# Patient Record
Sex: Male | Born: 1945
Health system: Southern US, Community
[De-identification: ages and names within clinical notes are randomized; demographics above are authoritative.]

## PROBLEM LIST (undated history)

## (undated) DIAGNOSIS — M5412 Radiculopathy, cervical region: Secondary | ICD-10-CM

## (undated) DIAGNOSIS — Q605 Renal hypoplasia, unspecified: Secondary | ICD-10-CM

## (undated) DIAGNOSIS — R1310 Dysphagia, unspecified: Secondary | ICD-10-CM

## (undated) DIAGNOSIS — G4700 Insomnia, unspecified: Secondary | ICD-10-CM

## (undated) DIAGNOSIS — Z8614 Personal history of Methicillin resistant Staphylococcus aureus infection: Secondary | ICD-10-CM

## (undated) DIAGNOSIS — Z905 Acquired absence of kidney: Secondary | ICD-10-CM

## (undated) DIAGNOSIS — R7303 Prediabetes: Secondary | ICD-10-CM

## (undated) DIAGNOSIS — E782 Mixed hyperlipidemia: Secondary | ICD-10-CM

## (undated) DIAGNOSIS — Q602 Renal agenesis, unspecified: Secondary | ICD-10-CM

## (undated) DIAGNOSIS — R002 Palpitations: Secondary | ICD-10-CM

## (undated) DIAGNOSIS — I739 Peripheral vascular disease, unspecified: Secondary | ICD-10-CM

## (undated) DIAGNOSIS — C61 Malignant neoplasm of prostate: Secondary | ICD-10-CM

## (undated) DIAGNOSIS — I10 Essential (primary) hypertension: Secondary | ICD-10-CM

## (undated) DIAGNOSIS — R200 Anesthesia of skin: Secondary | ICD-10-CM

## (undated) DIAGNOSIS — E559 Vitamin D deficiency, unspecified: Secondary | ICD-10-CM

## (undated) DIAGNOSIS — K219 Gastro-esophageal reflux disease without esophagitis: Secondary | ICD-10-CM

## (undated) DIAGNOSIS — M545 Low back pain: Secondary | ICD-10-CM

## (undated) DIAGNOSIS — E291 Testicular hypofunction: Secondary | ICD-10-CM

## (undated) HISTORY — DX: Insomnia, unspecified: G47.00

## (undated) HISTORY — DX: Dysphagia, unspecified: R13.10

## (undated) HISTORY — DX: Acquired absence of kidney: Z90.5

## (undated) HISTORY — DX: Gastro-esophageal reflux disease without esophagitis: K21.9

## (undated) HISTORY — DX: Prediabetes: R73.03

## (undated) HISTORY — DX: Renal hypoplasia, unspecified: Q60.5

## (undated) HISTORY — DX: Testicular hypofunction: E29.1

## (undated) HISTORY — DX: Peripheral vascular disease, unspecified: I73.9

## (undated) HISTORY — DX: Palpitations: R00.2

## (undated) HISTORY — DX: Low back pain: M54.5

## (undated) HISTORY — DX: Anesthesia of skin: R20.0

## (undated) HISTORY — DX: Radiculopathy, cervical region: M54.12

## (undated) HISTORY — DX: Mixed hyperlipidemia: E78.2

## (undated) HISTORY — DX: Vitamin D deficiency, unspecified: E55.9

## (undated) HISTORY — DX: Renal agenesis, unspecified: Q60.2

## (undated) HISTORY — DX: Personal history of Methicillin resistant Staphylococcus aureus infection: Z86.14

---

## 1988-12-29 DIAGNOSIS — Z905 Acquired absence of kidney: Secondary | ICD-10-CM

## 1988-12-29 HISTORY — DX: Acquired absence of kidney: Z90.5

## 1989-12-29 HISTORY — PX: SPINE SURGERY: SHX786

## 1998-12-29 DIAGNOSIS — E782 Mixed hyperlipidemia: Secondary | ICD-10-CM

## 1998-12-29 HISTORY — DX: Mixed hyperlipidemia: E78.2

## 1999-12-30 HISTORY — PX: CHOLECYSTECTOMY: SHX55

## 1999-12-30 HISTORY — PX: NEPHRECTOMY: SHX65

## 2006-08-28 ENCOUNTER — Emergency Department: Payer: Self-pay | Admitting: Unknown Physician Specialty

## 2006-08-28 ENCOUNTER — Other Ambulatory Visit: Payer: Self-pay

## 2006-09-01 ENCOUNTER — Ambulatory Visit: Payer: Self-pay | Admitting: Unknown Physician Specialty

## 2006-12-29 HISTORY — PX: PENILE PROSTHESIS IMPLANT: SHX240

## 2007-03-16 ENCOUNTER — Ambulatory Visit: Payer: Self-pay | Admitting: Urology

## 2007-03-22 ENCOUNTER — Ambulatory Visit: Payer: Self-pay | Admitting: Urology

## 2007-11-18 DIAGNOSIS — Z8614 Personal history of Methicillin resistant Staphylococcus aureus infection: Secondary | ICD-10-CM

## 2007-11-18 HISTORY — DX: Personal history of Methicillin resistant Staphylococcus aureus infection: Z86.14

## 2008-12-14 ENCOUNTER — Ambulatory Visit: Payer: Self-pay | Admitting: Family Medicine

## 2008-12-20 ENCOUNTER — Ambulatory Visit: Payer: Self-pay | Admitting: Gastroenterology

## 2009-01-05 ENCOUNTER — Ambulatory Visit: Payer: Self-pay | Admitting: Family Medicine

## 2009-04-02 DIAGNOSIS — R1310 Dysphagia, unspecified: Secondary | ICD-10-CM

## 2009-04-02 DIAGNOSIS — M5412 Radiculopathy, cervical region: Secondary | ICD-10-CM

## 2009-04-02 DIAGNOSIS — I10 Essential (primary) hypertension: Secondary | ICD-10-CM

## 2009-04-02 HISTORY — DX: Essential (primary) hypertension: I10

## 2009-04-02 HISTORY — DX: Dysphagia, unspecified: R13.10

## 2009-04-02 HISTORY — DX: Radiculopathy, cervical region: M54.12

## 2011-06-09 ENCOUNTER — Ambulatory Visit: Payer: Self-pay | Admitting: Family Medicine

## 2011-07-09 HISTORY — PX: OTHER SURGICAL HISTORY: SHX169

## 2011-07-10 HISTORY — PX: UPPER GI ENDOSCOPY: SHX6162

## 2011-11-28 ENCOUNTER — Ambulatory Visit: Payer: Self-pay | Admitting: Family Medicine

## 2011-11-28 HISTORY — PX: OTHER SURGICAL HISTORY: SHX169

## 2012-01-19 ENCOUNTER — Ambulatory Visit: Payer: Self-pay | Admitting: Urology

## 2012-01-19 HISTORY — PX: OTHER SURGICAL HISTORY: SHX169

## 2012-04-15 DIAGNOSIS — I10 Essential (primary) hypertension: Secondary | ICD-10-CM | POA: Diagnosis not present

## 2012-04-15 DIAGNOSIS — E119 Type 2 diabetes mellitus without complications: Secondary | ICD-10-CM | POA: Diagnosis not present

## 2012-04-15 DIAGNOSIS — M79609 Pain in unspecified limb: Secondary | ICD-10-CM | POA: Diagnosis not present

## 2012-04-15 DIAGNOSIS — E559 Vitamin D deficiency, unspecified: Secondary | ICD-10-CM | POA: Diagnosis not present

## 2012-04-21 DIAGNOSIS — R972 Elevated prostate specific antigen [PSA]: Secondary | ICD-10-CM | POA: Diagnosis not present

## 2012-05-26 DIAGNOSIS — D400 Neoplasm of uncertain behavior of prostate: Secondary | ICD-10-CM | POA: Diagnosis not present

## 2012-05-26 DIAGNOSIS — N529 Male erectile dysfunction, unspecified: Secondary | ICD-10-CM | POA: Diagnosis not present

## 2012-07-15 DIAGNOSIS — I10 Essential (primary) hypertension: Secondary | ICD-10-CM | POA: Diagnosis not present

## 2012-07-15 DIAGNOSIS — E119 Type 2 diabetes mellitus without complications: Secondary | ICD-10-CM | POA: Diagnosis not present

## 2012-07-15 DIAGNOSIS — E559 Vitamin D deficiency, unspecified: Secondary | ICD-10-CM | POA: Diagnosis not present

## 2012-07-15 DIAGNOSIS — M79609 Pain in unspecified limb: Secondary | ICD-10-CM | POA: Diagnosis not present

## 2012-10-15 DIAGNOSIS — M79609 Pain in unspecified limb: Secondary | ICD-10-CM | POA: Diagnosis not present

## 2012-10-15 DIAGNOSIS — M25559 Pain in unspecified hip: Secondary | ICD-10-CM | POA: Diagnosis not present

## 2012-10-15 DIAGNOSIS — R209 Unspecified disturbances of skin sensation: Secondary | ICD-10-CM | POA: Diagnosis not present

## 2012-10-15 DIAGNOSIS — R51 Headache: Secondary | ICD-10-CM | POA: Diagnosis not present

## 2012-10-18 ENCOUNTER — Ambulatory Visit: Payer: Self-pay | Admitting: Family Medicine

## 2012-10-18 DIAGNOSIS — I6529 Occlusion and stenosis of unspecified carotid artery: Secondary | ICD-10-CM | POA: Diagnosis not present

## 2012-10-18 DIAGNOSIS — R209 Unspecified disturbances of skin sensation: Secondary | ICD-10-CM | POA: Diagnosis not present

## 2012-10-18 HISTORY — PX: OTHER SURGICAL HISTORY: SHX169

## 2012-10-20 DIAGNOSIS — N529 Male erectile dysfunction, unspecified: Secondary | ICD-10-CM | POA: Diagnosis not present

## 2012-10-20 DIAGNOSIS — D400 Neoplasm of uncertain behavior of prostate: Secondary | ICD-10-CM | POA: Diagnosis not present

## 2012-10-29 DIAGNOSIS — M25559 Pain in unspecified hip: Secondary | ICD-10-CM | POA: Diagnosis not present

## 2012-10-29 DIAGNOSIS — M79609 Pain in unspecified limb: Secondary | ICD-10-CM | POA: Diagnosis not present

## 2012-10-29 DIAGNOSIS — I10 Essential (primary) hypertension: Secondary | ICD-10-CM | POA: Diagnosis not present

## 2012-10-29 DIAGNOSIS — I70219 Atherosclerosis of native arteries of extremities with intermittent claudication, unspecified extremity: Secondary | ICD-10-CM | POA: Diagnosis not present

## 2012-11-05 DIAGNOSIS — R972 Elevated prostate specific antigen [PSA]: Secondary | ICD-10-CM | POA: Diagnosis not present

## 2012-11-05 DIAGNOSIS — C61 Malignant neoplasm of prostate: Secondary | ICD-10-CM | POA: Diagnosis not present

## 2012-11-11 DIAGNOSIS — R972 Elevated prostate specific antigen [PSA]: Secondary | ICD-10-CM | POA: Diagnosis not present

## 2012-11-12 DIAGNOSIS — L723 Sebaceous cyst: Secondary | ICD-10-CM | POA: Diagnosis not present

## 2012-11-12 DIAGNOSIS — L821 Other seborrheic keratosis: Secondary | ICD-10-CM | POA: Diagnosis not present

## 2012-11-12 DIAGNOSIS — L820 Inflamed seborrheic keratosis: Secondary | ICD-10-CM | POA: Diagnosis not present

## 2012-11-22 DIAGNOSIS — L723 Sebaceous cyst: Secondary | ICD-10-CM | POA: Diagnosis not present

## 2012-12-17 DIAGNOSIS — C61 Malignant neoplasm of prostate: Secondary | ICD-10-CM | POA: Diagnosis not present

## 2012-12-29 DIAGNOSIS — C61 Malignant neoplasm of prostate: Secondary | ICD-10-CM

## 2012-12-29 HISTORY — DX: Malignant neoplasm of prostate: C61

## 2013-02-02 DIAGNOSIS — D400 Neoplasm of uncertain behavior of prostate: Secondary | ICD-10-CM | POA: Diagnosis not present

## 2013-02-02 DIAGNOSIS — C61 Malignant neoplasm of prostate: Secondary | ICD-10-CM | POA: Diagnosis not present

## 2013-02-02 DIAGNOSIS — N529 Male erectile dysfunction, unspecified: Secondary | ICD-10-CM | POA: Diagnosis not present

## 2013-03-07 DIAGNOSIS — I10 Essential (primary) hypertension: Secondary | ICD-10-CM | POA: Diagnosis not present

## 2013-03-07 DIAGNOSIS — M25569 Pain in unspecified knee: Secondary | ICD-10-CM | POA: Diagnosis not present

## 2013-03-07 DIAGNOSIS — E559 Vitamin D deficiency, unspecified: Secondary | ICD-10-CM | POA: Diagnosis not present

## 2013-03-07 DIAGNOSIS — E119 Type 2 diabetes mellitus without complications: Secondary | ICD-10-CM | POA: Diagnosis not present

## 2013-03-09 DIAGNOSIS — M5137 Other intervertebral disc degeneration, lumbosacral region: Secondary | ICD-10-CM | POA: Diagnosis not present

## 2013-03-10 ENCOUNTER — Ambulatory Visit: Payer: Self-pay | Admitting: Orthopedic Surgery

## 2013-03-10 DIAGNOSIS — M47817 Spondylosis without myelopathy or radiculopathy, lumbosacral region: Secondary | ICD-10-CM | POA: Diagnosis not present

## 2013-03-10 DIAGNOSIS — M5137 Other intervertebral disc degeneration, lumbosacral region: Secondary | ICD-10-CM | POA: Diagnosis not present

## 2013-03-24 DIAGNOSIS — M48061 Spinal stenosis, lumbar region without neurogenic claudication: Secondary | ICD-10-CM | POA: Diagnosis not present

## 2013-03-24 DIAGNOSIS — M961 Postlaminectomy syndrome, not elsewhere classified: Secondary | ICD-10-CM | POA: Diagnosis not present

## 2013-03-31 ENCOUNTER — Ambulatory Visit: Payer: Self-pay | Admitting: Orthopedic Surgery

## 2013-03-31 DIAGNOSIS — M47817 Spondylosis without myelopathy or radiculopathy, lumbosacral region: Secondary | ICD-10-CM | POA: Diagnosis not present

## 2013-04-20 DIAGNOSIS — M545 Low back pain: Secondary | ICD-10-CM | POA: Diagnosis not present

## 2013-04-21 DIAGNOSIS — M48061 Spinal stenosis, lumbar region without neurogenic claudication: Secondary | ICD-10-CM | POA: Diagnosis not present

## 2013-04-29 DIAGNOSIS — M961 Postlaminectomy syndrome, not elsewhere classified: Secondary | ICD-10-CM | POA: Diagnosis not present

## 2013-04-29 DIAGNOSIS — M48061 Spinal stenosis, lumbar region without neurogenic claudication: Secondary | ICD-10-CM | POA: Diagnosis not present

## 2013-05-10 DIAGNOSIS — C61 Malignant neoplasm of prostate: Secondary | ICD-10-CM | POA: Diagnosis not present

## 2013-05-10 DIAGNOSIS — D400 Neoplasm of uncertain behavior of prostate: Secondary | ICD-10-CM | POA: Diagnosis not present

## 2013-05-10 DIAGNOSIS — N529 Male erectile dysfunction, unspecified: Secondary | ICD-10-CM | POA: Diagnosis not present

## 2013-06-03 DIAGNOSIS — C61 Malignant neoplasm of prostate: Secondary | ICD-10-CM | POA: Diagnosis not present

## 2013-06-06 DIAGNOSIS — N529 Male erectile dysfunction, unspecified: Secondary | ICD-10-CM | POA: Diagnosis not present

## 2013-06-09 DIAGNOSIS — M961 Postlaminectomy syndrome, not elsewhere classified: Secondary | ICD-10-CM | POA: Diagnosis not present

## 2013-06-20 DIAGNOSIS — N401 Enlarged prostate with lower urinary tract symptoms: Secondary | ICD-10-CM | POA: Diagnosis not present

## 2013-07-07 DIAGNOSIS — C61 Malignant neoplasm of prostate: Secondary | ICD-10-CM | POA: Diagnosis not present

## 2013-07-07 DIAGNOSIS — N400 Enlarged prostate without lower urinary tract symptoms: Secondary | ICD-10-CM | POA: Diagnosis not present

## 2013-07-13 DIAGNOSIS — M545 Low back pain, unspecified: Secondary | ICD-10-CM | POA: Insufficient documentation

## 2013-07-13 HISTORY — DX: Low back pain, unspecified: M54.50

## 2013-07-15 DIAGNOSIS — C61 Malignant neoplasm of prostate: Secondary | ICD-10-CM | POA: Diagnosis not present

## 2013-07-19 DIAGNOSIS — IMO0002 Reserved for concepts with insufficient information to code with codable children: Secondary | ICD-10-CM | POA: Diagnosis not present

## 2013-07-22 DIAGNOSIS — IMO0002 Reserved for concepts with insufficient information to code with codable children: Secondary | ICD-10-CM | POA: Diagnosis not present

## 2013-07-25 DIAGNOSIS — IMO0002 Reserved for concepts with insufficient information to code with codable children: Secondary | ICD-10-CM | POA: Diagnosis not present

## 2013-07-27 ENCOUNTER — Emergency Department: Payer: Self-pay | Admitting: Emergency Medicine

## 2013-07-27 DIAGNOSIS — E119 Type 2 diabetes mellitus without complications: Secondary | ICD-10-CM | POA: Diagnosis not present

## 2013-07-27 DIAGNOSIS — I10 Essential (primary) hypertension: Secondary | ICD-10-CM | POA: Diagnosis not present

## 2013-07-27 DIAGNOSIS — R55 Syncope and collapse: Secondary | ICD-10-CM | POA: Diagnosis not present

## 2013-07-27 DIAGNOSIS — Z87891 Personal history of nicotine dependence: Secondary | ICD-10-CM | POA: Diagnosis not present

## 2013-07-27 DIAGNOSIS — R6889 Other general symptoms and signs: Secondary | ICD-10-CM | POA: Diagnosis not present

## 2013-07-27 DIAGNOSIS — Z9889 Other specified postprocedural states: Secondary | ICD-10-CM | POA: Diagnosis not present

## 2013-07-27 DIAGNOSIS — C61 Malignant neoplasm of prostate: Secondary | ICD-10-CM | POA: Diagnosis not present

## 2013-07-27 DIAGNOSIS — Z8546 Personal history of malignant neoplasm of prostate: Secondary | ICD-10-CM | POA: Diagnosis not present

## 2013-07-27 LAB — URINALYSIS, COMPLETE
Ph: 7 (ref 4.5–8.0)
Protein: 100
RBC,UR: 3630 /HPF (ref 0–5)
Squamous Epithelial: 4
WBC UR: 180 /HPF (ref 0–5)

## 2013-07-27 LAB — COMPREHENSIVE METABOLIC PANEL
Alkaline Phosphatase: 80 U/L (ref 50–136)
Anion Gap: 7 (ref 7–16)
BUN: 15 mg/dL (ref 7–18)
Calcium, Total: 9.3 mg/dL (ref 8.5–10.1)
Chloride: 104 mmol/L (ref 98–107)
EGFR (African American): 38 — ABNORMAL LOW
Glucose: 156 mg/dL — ABNORMAL HIGH (ref 65–99)
Potassium: 3.5 mmol/L (ref 3.5–5.1)
SGOT(AST): 15 U/L (ref 15–37)
SGPT (ALT): 18 U/L (ref 12–78)

## 2013-07-27 LAB — CBC
MCH: 21.7 pg — ABNORMAL LOW (ref 26.0–34.0)
MCV: 67 fL — ABNORMAL LOW (ref 80–100)
Platelet: 293 10*3/uL (ref 150–440)
RBC: 5.95 10*6/uL — ABNORMAL HIGH (ref 4.40–5.90)
RDW: 16.3 % — ABNORMAL HIGH (ref 11.5–14.5)
WBC: 12.3 10*3/uL — ABNORMAL HIGH (ref 3.8–10.6)

## 2013-07-27 LAB — CK TOTAL AND CKMB (NOT AT ARMC)
CK, Total: 123 U/L (ref 35–232)
CK-MB: 1.1 ng/mL (ref 0.5–3.6)

## 2013-08-03 DIAGNOSIS — C61 Malignant neoplasm of prostate: Secondary | ICD-10-CM | POA: Diagnosis not present

## 2013-08-09 DIAGNOSIS — R350 Frequency of micturition: Secondary | ICD-10-CM | POA: Diagnosis not present

## 2013-08-09 DIAGNOSIS — R351 Nocturia: Secondary | ICD-10-CM | POA: Diagnosis not present

## 2013-08-11 DIAGNOSIS — R319 Hematuria, unspecified: Secondary | ICD-10-CM | POA: Diagnosis not present

## 2013-08-17 DIAGNOSIS — R300 Dysuria: Secondary | ICD-10-CM | POA: Diagnosis not present

## 2013-08-17 DIAGNOSIS — N318 Other neuromuscular dysfunction of bladder: Secondary | ICD-10-CM | POA: Diagnosis not present

## 2013-08-17 DIAGNOSIS — C61 Malignant neoplasm of prostate: Secondary | ICD-10-CM | POA: Diagnosis not present

## 2013-08-17 DIAGNOSIS — D400 Neoplasm of uncertain behavior of prostate: Secondary | ICD-10-CM | POA: Diagnosis not present

## 2013-08-31 DIAGNOSIS — R300 Dysuria: Secondary | ICD-10-CM | POA: Diagnosis not present

## 2013-08-31 DIAGNOSIS — C61 Malignant neoplasm of prostate: Secondary | ICD-10-CM | POA: Diagnosis not present

## 2013-08-31 DIAGNOSIS — R350 Frequency of micturition: Secondary | ICD-10-CM | POA: Diagnosis not present

## 2013-08-31 DIAGNOSIS — D400 Neoplasm of uncertain behavior of prostate: Secondary | ICD-10-CM | POA: Diagnosis not present

## 2013-09-13 DIAGNOSIS — D400 Neoplasm of uncertain behavior of prostate: Secondary | ICD-10-CM | POA: Diagnosis not present

## 2013-09-13 DIAGNOSIS — I10 Essential (primary) hypertension: Secondary | ICD-10-CM | POA: Diagnosis not present

## 2013-09-13 DIAGNOSIS — E785 Hyperlipidemia, unspecified: Secondary | ICD-10-CM | POA: Diagnosis not present

## 2013-09-13 DIAGNOSIS — C61 Malignant neoplasm of prostate: Secondary | ICD-10-CM | POA: Diagnosis not present

## 2013-09-13 DIAGNOSIS — N318 Other neuromuscular dysfunction of bladder: Secondary | ICD-10-CM | POA: Diagnosis not present

## 2013-09-13 DIAGNOSIS — E119 Type 2 diabetes mellitus without complications: Secondary | ICD-10-CM | POA: Diagnosis not present

## 2013-09-14 DIAGNOSIS — E782 Mixed hyperlipidemia: Secondary | ICD-10-CM | POA: Diagnosis not present

## 2013-09-14 DIAGNOSIS — I10 Essential (primary) hypertension: Secondary | ICD-10-CM | POA: Diagnosis not present

## 2013-09-14 DIAGNOSIS — E559 Vitamin D deficiency, unspecified: Secondary | ICD-10-CM | POA: Diagnosis not present

## 2013-10-11 DIAGNOSIS — D400 Neoplasm of uncertain behavior of prostate: Secondary | ICD-10-CM | POA: Diagnosis not present

## 2013-10-11 DIAGNOSIS — R351 Nocturia: Secondary | ICD-10-CM | POA: Diagnosis not present

## 2013-10-11 DIAGNOSIS — C61 Malignant neoplasm of prostate: Secondary | ICD-10-CM | POA: Diagnosis not present

## 2013-10-18 DIAGNOSIS — R300 Dysuria: Secondary | ICD-10-CM | POA: Diagnosis not present

## 2013-10-18 DIAGNOSIS — N318 Other neuromuscular dysfunction of bladder: Secondary | ICD-10-CM | POA: Diagnosis not present

## 2013-10-18 DIAGNOSIS — D400 Neoplasm of uncertain behavior of prostate: Secondary | ICD-10-CM | POA: Diagnosis not present

## 2013-11-09 DIAGNOSIS — N480 Leukoplakia of penis: Secondary | ICD-10-CM | POA: Diagnosis not present

## 2013-11-09 DIAGNOSIS — D400 Neoplasm of uncertain behavior of prostate: Secondary | ICD-10-CM | POA: Diagnosis not present

## 2013-11-09 DIAGNOSIS — R319 Hematuria, unspecified: Secondary | ICD-10-CM | POA: Diagnosis not present

## 2013-11-09 DIAGNOSIS — R300 Dysuria: Secondary | ICD-10-CM | POA: Diagnosis not present

## 2013-11-16 DIAGNOSIS — N200 Calculus of kidney: Secondary | ICD-10-CM | POA: Diagnosis not present

## 2013-11-22 DIAGNOSIS — R350 Frequency of micturition: Secondary | ICD-10-CM | POA: Diagnosis not present

## 2013-11-22 DIAGNOSIS — N529 Male erectile dysfunction, unspecified: Secondary | ICD-10-CM | POA: Diagnosis not present

## 2013-11-22 DIAGNOSIS — C61 Malignant neoplasm of prostate: Secondary | ICD-10-CM | POA: Diagnosis not present

## 2013-11-28 ENCOUNTER — Ambulatory Visit: Payer: Self-pay | Admitting: Urology

## 2013-11-28 DIAGNOSIS — T85698A Other mechanical complication of other specified internal prosthetic devices, implants and grafts, initial encounter: Secondary | ICD-10-CM | POA: Diagnosis not present

## 2013-11-28 DIAGNOSIS — T8389XA Other specified complication of genitourinary prosthetic devices, implants and grafts, initial encounter: Secondary | ICD-10-CM | POA: Diagnosis not present

## 2013-11-28 DIAGNOSIS — Z23 Encounter for immunization: Secondary | ICD-10-CM | POA: Diagnosis not present

## 2013-11-28 DIAGNOSIS — C61 Malignant neoplasm of prostate: Secondary | ICD-10-CM | POA: Diagnosis not present

## 2013-11-28 HISTORY — PX: PENILE PROSTHESIS  REMOVAL: SHX2202

## 2013-12-14 DIAGNOSIS — T190XXA Foreign body in urethra, initial encounter: Secondary | ICD-10-CM | POA: Diagnosis not present

## 2013-12-14 DIAGNOSIS — N3289 Other specified disorders of bladder: Secondary | ICD-10-CM | POA: Diagnosis not present

## 2013-12-24 ENCOUNTER — Emergency Department: Payer: Self-pay | Admitting: Emergency Medicine

## 2013-12-24 ENCOUNTER — Ambulatory Visit: Payer: Self-pay | Admitting: Urology

## 2013-12-24 DIAGNOSIS — R319 Hematuria, unspecified: Secondary | ICD-10-CM | POA: Diagnosis not present

## 2013-12-24 DIAGNOSIS — Z79899 Other long term (current) drug therapy: Secondary | ICD-10-CM | POA: Diagnosis not present

## 2013-12-24 DIAGNOSIS — M549 Dorsalgia, unspecified: Secondary | ICD-10-CM | POA: Diagnosis not present

## 2013-12-24 DIAGNOSIS — I10 Essential (primary) hypertension: Secondary | ICD-10-CM | POA: Diagnosis not present

## 2013-12-24 DIAGNOSIS — E785 Hyperlipidemia, unspecified: Secondary | ICD-10-CM | POA: Diagnosis not present

## 2013-12-24 DIAGNOSIS — Z9089 Acquired absence of other organs: Secondary | ICD-10-CM | POA: Diagnosis not present

## 2013-12-24 DIAGNOSIS — Z8546 Personal history of malignant neoplasm of prostate: Secondary | ICD-10-CM | POA: Diagnosis not present

## 2013-12-24 DIAGNOSIS — R310 Gross hematuria: Secondary | ICD-10-CM | POA: Diagnosis not present

## 2013-12-24 DIAGNOSIS — E119 Type 2 diabetes mellitus without complications: Secondary | ICD-10-CM | POA: Diagnosis not present

## 2013-12-24 DIAGNOSIS — G8929 Other chronic pain: Secondary | ICD-10-CM | POA: Diagnosis not present

## 2013-12-24 LAB — URINALYSIS, COMPLETE
Bacteria: NONE SEEN
Bilirubin,UR: NEGATIVE
Glucose,UR: NEGATIVE mg/dL (ref 0–75)
Ph: 6 (ref 4.5–8.0)
Protein: 100
Specific Gravity: 1.029 (ref 1.003–1.030)
Squamous Epithelial: <1
WBC UR: 144 /HPF (ref 0–5)

## 2013-12-24 LAB — BASIC METABOLIC PANEL
Anion Gap: 5 — ABNORMAL LOW (ref 7–16)
BUN: 20 mg/dL — ABNORMAL HIGH (ref 7–18)
Calcium, Total: 9.5 mg/dL (ref 8.5–10.1)
Chloride: 104 mmol/L (ref 98–107)
Creatinine: 1.55 mg/dL — ABNORMAL HIGH (ref 0.60–1.30)
EGFR (Non-African Amer.): 46 — ABNORMAL LOW
Osmolality: 278 (ref 275–301)

## 2013-12-24 LAB — CBC WITH DIFFERENTIAL/PLATELET
Basophil #: 0.1 10*3/uL (ref 0.0–0.1)
Basophil %: 0.7 %
Eosinophil #: 0 10*3/uL (ref 0.0–0.7)
Eosinophil %: 0.5 %
HGB: 11.2 g/dL — ABNORMAL LOW (ref 13.0–18.0)
Lymphocyte %: 23.2 %
MCH: 21.1 pg — ABNORMAL LOW (ref 26.0–34.0)
Monocyte #: 0.8 x10 3/mm (ref 0.2–1.0)
Monocyte %: 8.1 %
RBC: 5.3 10*6/uL (ref 4.40–5.90)
RDW: 15 % — ABNORMAL HIGH (ref 11.5–14.5)
WBC: 9.6 10*3/uL (ref 3.8–10.6)

## 2014-01-03 DIAGNOSIS — N322 Vesical fistula, not elsewhere classified: Secondary | ICD-10-CM | POA: Diagnosis not present

## 2014-01-03 DIAGNOSIS — C61 Malignant neoplasm of prostate: Secondary | ICD-10-CM | POA: Diagnosis not present

## 2014-01-03 DIAGNOSIS — D400 Neoplasm of uncertain behavior of prostate: Secondary | ICD-10-CM | POA: Diagnosis not present

## 2014-01-09 DIAGNOSIS — N3289 Other specified disorders of bladder: Secondary | ICD-10-CM | POA: Diagnosis not present

## 2014-01-09 DIAGNOSIS — C61 Malignant neoplasm of prostate: Secondary | ICD-10-CM | POA: Diagnosis not present

## 2014-01-09 DIAGNOSIS — N322 Vesical fistula, not elsewhere classified: Secondary | ICD-10-CM | POA: Diagnosis not present

## 2014-01-09 DIAGNOSIS — R350 Frequency of micturition: Secondary | ICD-10-CM | POA: Diagnosis not present

## 2014-01-31 DIAGNOSIS — K219 Gastro-esophageal reflux disease without esophagitis: Secondary | ICD-10-CM | POA: Diagnosis not present

## 2014-01-31 DIAGNOSIS — I10 Essential (primary) hypertension: Secondary | ICD-10-CM | POA: Diagnosis not present

## 2014-01-31 DIAGNOSIS — E119 Type 2 diabetes mellitus without complications: Secondary | ICD-10-CM | POA: Diagnosis not present

## 2014-01-31 DIAGNOSIS — E559 Vitamin D deficiency, unspecified: Secondary | ICD-10-CM | POA: Diagnosis not present

## 2014-03-20 DIAGNOSIS — E559 Vitamin D deficiency, unspecified: Secondary | ICD-10-CM | POA: Diagnosis not present

## 2014-03-20 DIAGNOSIS — K625 Hemorrhage of anus and rectum: Secondary | ICD-10-CM | POA: Diagnosis not present

## 2014-03-20 DIAGNOSIS — R198 Other specified symptoms and signs involving the digestive system and abdomen: Secondary | ICD-10-CM | POA: Diagnosis not present

## 2014-03-20 DIAGNOSIS — K219 Gastro-esophageal reflux disease without esophagitis: Secondary | ICD-10-CM | POA: Diagnosis not present

## 2014-04-07 ENCOUNTER — Ambulatory Visit: Payer: Self-pay | Admitting: Gastroenterology

## 2014-04-07 DIAGNOSIS — Z9489 Other transplanted organ and tissue status: Secondary | ICD-10-CM | POA: Diagnosis not present

## 2014-04-07 DIAGNOSIS — D126 Benign neoplasm of colon, unspecified: Secondary | ICD-10-CM | POA: Diagnosis not present

## 2014-04-07 DIAGNOSIS — E291 Testicular hypofunction: Secondary | ICD-10-CM | POA: Diagnosis not present

## 2014-04-07 DIAGNOSIS — M542 Cervicalgia: Secondary | ICD-10-CM | POA: Diagnosis not present

## 2014-04-07 DIAGNOSIS — K921 Melena: Secondary | ICD-10-CM | POA: Diagnosis not present

## 2014-04-07 DIAGNOSIS — I10 Essential (primary) hypertension: Secondary | ICD-10-CM | POA: Diagnosis not present

## 2014-04-07 DIAGNOSIS — Z905 Acquired absence of kidney: Secondary | ICD-10-CM | POA: Diagnosis not present

## 2014-04-07 DIAGNOSIS — Z1211 Encounter for screening for malignant neoplasm of colon: Secondary | ICD-10-CM | POA: Diagnosis not present

## 2014-04-07 DIAGNOSIS — K219 Gastro-esophageal reflux disease without esophagitis: Secondary | ICD-10-CM | POA: Diagnosis not present

## 2014-04-07 DIAGNOSIS — E559 Vitamin D deficiency, unspecified: Secondary | ICD-10-CM | POA: Diagnosis not present

## 2014-04-07 DIAGNOSIS — R209 Unspecified disturbances of skin sensation: Secondary | ICD-10-CM | POA: Diagnosis not present

## 2014-04-07 DIAGNOSIS — Z79899 Other long term (current) drug therapy: Secondary | ICD-10-CM | POA: Diagnosis not present

## 2014-04-07 DIAGNOSIS — E785 Hyperlipidemia, unspecified: Secondary | ICD-10-CM | POA: Diagnosis not present

## 2014-04-07 DIAGNOSIS — R002 Palpitations: Secondary | ICD-10-CM | POA: Diagnosis not present

## 2014-04-07 DIAGNOSIS — IMO0002 Reserved for concepts with insufficient information to code with codable children: Secondary | ICD-10-CM | POA: Diagnosis not present

## 2014-04-07 DIAGNOSIS — K648 Other hemorrhoids: Secondary | ICD-10-CM | POA: Diagnosis not present

## 2014-04-07 DIAGNOSIS — G4700 Insomnia, unspecified: Secondary | ICD-10-CM | POA: Diagnosis not present

## 2014-04-07 LAB — HM COLONOSCOPY

## 2014-04-10 DIAGNOSIS — R300 Dysuria: Secondary | ICD-10-CM | POA: Diagnosis not present

## 2014-04-10 DIAGNOSIS — R350 Frequency of micturition: Secondary | ICD-10-CM | POA: Diagnosis not present

## 2014-04-10 DIAGNOSIS — R351 Nocturia: Secondary | ICD-10-CM | POA: Diagnosis not present

## 2014-04-10 DIAGNOSIS — C61 Malignant neoplasm of prostate: Secondary | ICD-10-CM | POA: Diagnosis not present

## 2014-04-10 DIAGNOSIS — D400 Neoplasm of uncertain behavior of prostate: Secondary | ICD-10-CM | POA: Diagnosis not present

## 2014-07-11 DIAGNOSIS — D400 Neoplasm of uncertain behavior of prostate: Secondary | ICD-10-CM | POA: Diagnosis not present

## 2014-07-11 DIAGNOSIS — N529 Male erectile dysfunction, unspecified: Secondary | ICD-10-CM | POA: Diagnosis not present

## 2014-07-11 DIAGNOSIS — C61 Malignant neoplasm of prostate: Secondary | ICD-10-CM | POA: Diagnosis not present

## 2014-07-11 DIAGNOSIS — N318 Other neuromuscular dysfunction of bladder: Secondary | ICD-10-CM | POA: Diagnosis not present

## 2014-10-26 DIAGNOSIS — Z23 Encounter for immunization: Secondary | ICD-10-CM | POA: Diagnosis not present

## 2015-01-09 DIAGNOSIS — N5201 Erectile dysfunction due to arterial insufficiency: Secondary | ICD-10-CM | POA: Diagnosis not present

## 2015-01-09 DIAGNOSIS — D400 Neoplasm of uncertain behavior of prostate: Secondary | ICD-10-CM | POA: Diagnosis not present

## 2015-01-10 DIAGNOSIS — Z125 Encounter for screening for malignant neoplasm of prostate: Secondary | ICD-10-CM | POA: Diagnosis not present

## 2015-02-20 DIAGNOSIS — I10 Essential (primary) hypertension: Secondary | ICD-10-CM | POA: Diagnosis not present

## 2015-02-20 DIAGNOSIS — E119 Type 2 diabetes mellitus without complications: Secondary | ICD-10-CM | POA: Diagnosis not present

## 2015-02-20 DIAGNOSIS — E782 Mixed hyperlipidemia: Secondary | ICD-10-CM | POA: Diagnosis not present

## 2015-02-20 LAB — HEMOGLOBIN A1C: HEMOGLOBIN A1C: 6

## 2015-02-22 DIAGNOSIS — E559 Vitamin D deficiency, unspecified: Secondary | ICD-10-CM | POA: Diagnosis not present

## 2015-02-22 DIAGNOSIS — E782 Mixed hyperlipidemia: Secondary | ICD-10-CM | POA: Diagnosis not present

## 2015-02-22 DIAGNOSIS — I10 Essential (primary) hypertension: Secondary | ICD-10-CM | POA: Diagnosis not present

## 2015-02-22 LAB — BASIC METABOLIC PANEL
BUN: 12 mg/dL (ref 4–21)
CREATININE: 1.5 mg/dL — AB (ref 0.6–1.3)
GLUCOSE: 106 mg/dL
POTASSIUM: 4.2 mmol/L (ref 3.4–5.3)
SODIUM: 143 mmol/L (ref 137–147)

## 2015-02-22 LAB — LIPID PANEL
Cholesterol: 186 mg/dL (ref 0–200)
HDL: 45 mg/dL (ref 35–70)
LDL CALC: 88 mg/dL
TRIGLYCERIDES: 263 mg/dL — AB (ref 40–160)

## 2015-03-22 DIAGNOSIS — I10 Essential (primary) hypertension: Secondary | ICD-10-CM | POA: Diagnosis not present

## 2015-03-22 DIAGNOSIS — E782 Mixed hyperlipidemia: Secondary | ICD-10-CM | POA: Diagnosis not present

## 2015-03-22 DIAGNOSIS — E119 Type 2 diabetes mellitus without complications: Secondary | ICD-10-CM | POA: Diagnosis not present

## 2015-04-20 NOTE — H&P (Signed)
PATIENT NAME:  Luke [name], Luke [name] MR#:  [id number] DATE OF BIRTH:  1946/10/20  DATE OF ADMISSION:  11/28/2013  CHIEF COMPLAINT: Perineal discomfort, urinary frequency, and mechanical malfunction of penile prosthesis.   HISTORY OF PRESENT ILLNESS: Luke [name] is a 69 year old African American male with stage T1c Gleason grade 3 + 4 adenocarcinoma of the prostate. He is status post I-125 brachytherapy July 30th. He continued to have significant lower urinary tract symptoms and perineal discomfort. He was also noted to have microscopic hematuria. He also had noted malfunction of his penile prosthesis in early November. Evaluation in the office included cystoscopy which revealed erosion of his prosthesis reservoir into the bladder with heavy calcification of the prosthesis reservoir. He comes in now for removal of penile prosthesis.   ALLERGIES: No known drug allergies.   CURRENT MEDICATIONS: Include losartan, diltiazem, Zoloft, VESIcare, Megace, and Nucynta.   PAST SURGICAL HISTORY: Include:  1.  Lumbar laminectomy in 1983.  2.  Cholecystectomy in 2001.  3.  Right nephrectomy due to angiomyolipoma in 2001. 4.  Inflatable penile prosthesis placed in 2008. 5.  Green light vaporization of the prostate in 2013.   SOCIAL HISTORY: He denied tobacco use. He consumes alcohol rarely.   FAMILY HISTORY: Positive for diabetes.   PAST AND CURRENT MEDICAL CONDITIONS:  1.  Hypertension.  2.  Hyperlipidemia.  3.  Diabetes. 4.  Hypergonadism.  5.  Chronic back pain. 6.  Solitary kidney with history of angiomyolipoma. 7.  Erectile dysfunction.   REVIEW OF SYSTEMS: The patient denies chest pain, shortness of breath, or stroke. He does have tension headaches and chronic neck and back pain.   PHYSICAL EXAMINATION:  GENERAL: Well-nourished African American male in no acute distress.  HEENT: Sclerae were clear. Pupils were equally round and reactive to light and accommodation. Extraocular movements were intact.   NECK: Supple. No palpable cervical adenopathy. No audible carotid bruits.  LUNGS: Clear to auscultation. CARDIOVASCULAR: Regular rhythm and rate without audible murmurs or gallops.  ABDOMEN: Soft, nontender. GENITOURINARY: Circumcised. Penile prosthesis components were in good position. Testes were atrophic. The prosthesis could not be cycled.  RECTAL: 40 gram smooth, nontender prostate.  NEUROMUSCULAR: Alert and oriented x3.   IMPRESSION:  1.  Penile prosthesis reservoir erosion. 2.  Prostate cancer status post brachytherapy in July 2014.   PLAN: Removal of penile prosthesis.  ____________________________ Otelia Limes. Yves Dill, MD [email or web address] D: 11/22/2013 16:34:05 ET T: 11/22/2013 16:41:16 ET JOB#: [id number]  cc: Otelia Limes. Yves Dill, MD, <Dictator> Royston Cowper MD ELECTRONICALLY SIGNED 11/28/2013 16:59

## 2015-04-20 NOTE — Op Note (Signed)
PATIENT NAME:  Luke [name], Luke [name] MR#:  [id number] DATE OF BIRTH:  October 28, 1946  DATE OF PROCEDURE:  11/28/2013  PREOPERATIVE DIAGNOSIS: Erosion of penile prosthesis reservoir into the bladder.   POSTOPERATIVE DIAGNOSIS: Erosion of penile prosthesis reservoir into the bladder.   PROCEDURE: Complete removal of inflatable 3-piece penile prosthesis.   SURGEON: Maryan Puls, M.D.   ANESTHETIST: Alvin Critchley, MD   ANESTHESIA: General.   INDICATIONS: See the dictated history and physical. After informed consent, the patient requests the above procedure.   OPERATIVE SUMMARY: After adequate general anesthesia had been obtained, abdomen and perineum were prepped and draped in the usual fashion. Midline infraumbilical incision was made and carried down sharply through the skin and then through the subcutaneous fatty tissue with electrocautery. Rectus fascia was identified and sharply divided. The prevesical fascia was sharply divided and the retropubic space was then developed using blunt and sharp dissection. A Bookwalter retractor was placed. The bladder was then filled with 200 mL of sterile saline to  delineate its margins. Stay sutures were placed on either side of the bladder.   Bladder incision was made horizontally. The bladder was opened for approximately a length of 3 cm. The Foley catheter and prosthesis reservoir were identified in the bladder. The prosthesis reservoir tubing appear to be exiting the bladder laterally. The tubing was then divided and the reservoir removed. The opening where the tubing was located spontaneously collapsed and did not appear to require any sutures.   At this point, the bladder was closed in layers consisting of 4-0 chromic for the bladder mucosa followed by 3-0 chromic for the muscularis in a running locking fashion. The bladder was again filled with sterile saline and no leaks were noted. The incision was then closed in layers consisting of 0 chromic to reapproximate  the rectus muscles followed by a running 0 Maxon for the rectus fascia. Subcutaneous fatty tissue was reapproximated with interrupted 3-0 plain catgut and skin was closed with staples. Sponge, needle, and instrument counts were noted be correct at this point.   A midline raphe penoscrotal junction incision was made and carried down sharply through the skin and then down to the level of the pump using electrocautery. The pump was delivered into the surgical field. The tubing to the reservoir was dissected back to the connection point and all tubing was removed. The tubing to the cylinders was dissected back to either corpus cavernosum. Initially the left corpus cavernosum was identified and opened, removing the cylinder. The procedure was repeated on the right side in identical fashion. The corpora and surgical field were irrigated with GU irrigant. The corporotomies were closed with running 2-0 Vicryl. The surgical field was then copiously irrigated with GU irrigant. The subcutaneous fascial tissue was reapproximated with running 4-0 Vicryl suture. Skin was closed with interrupted 3-0 chromic. Sponge, needle and instrument counts were noted to be correct. Sterile dressing and scrotal supporter were applied. The procedure was then terminated and the patient was transferred to the recovery room in stable condition.   ____________________________ Otelia Limes. Yves Dill, MD [email or web address] D: 11/28/2013 13:05:52 ET T: 11/28/2013 15:05:08 ET JOB#: [id number]  cc: Otelia Limes. Yves Dill, MD, <Dictator> Royston Cowper MD ELECTRONICALLY SIGNED 11/28/2013 17:02

## 2015-04-20 NOTE — Consult Note (Signed)
CC: blood in catheter 69 y/o M with h/o prostate cancer s/p bracytherapy and recent IPP erosion into bladder s/p removal who is seen in consultation at the request of the ED for evaluation and recommendations regarding blood in his catheter. Mr. Rinkenberger has been recovering from an IPP removal after the reservoir eroded into his bladder. He has a catheter in place and noted some leakage around his catheter as well as blood in the catheter today. He came to the ED today because of this. His foley was flushed and the urine is now yellow and he isn't having any leakage. Incidentally, he also has some midline scrotal tenderness and swelling. They note that the swelling is much better with ice and elevation, but he is still tender to palpation. No fevers, chills, n/v, redness, pus, drainage. He has had some poor po intake the last two days. No known drug allergies.  MEDICATIONS: Include losartan, diltiazem, Zoloft, VESIcare, Megace, and Nucynta, acetaminophen SURGICAL HISTORY: Include:  Lumbar laminectomy in 1983.  Cholecystectomy in 2001.  Right nephrectomy due to angiomyolipoma in 2001. Inflatable penile prosthesis placed in 2008. Green light vaporization of the prostate in 2013. IPP Removal Dec 1 from erosion into bladder HISTORY: He denied tobacco use. He consumes alcohol rarely. here with his wife. HISTORY: Positive for diabetes.  AND CURRENT MEDICAL CONDITIONS:  Hypertension.  Hyperlipidemia.  Borderline Diabetes. Hypergonadism.  Chronic back pain. Solitary kidney with history of angiomyolipoma. Erectile dysfunction.  OF SYSTEMS: The patient denies chest pain, shortness of breath, or stroke. He does have tension headaches and chronic neck and back pain.  EXAMINATION: Well-nourished African American male in no acute distress. Sclerae were clear. Pupils were equally round and reactive to light and accommodation. Extraocular movements were intact. Supple. No palpable cervical adenopathy. No audible carotid  bruits. Clear to auscultation.Regular rhythm and rate without audible murmurs or gallops. Soft, nontender. incision healing well over low abdomenfoley in place draining clear yellow urine. scrotum with midline induration that is very TTP. no erythema at all in scrotum, penis, perineum, suprapubic area. right hydrocele present, testicle palpable and nontender. firm area at base of right penis but it is nontender. no purulence - incision in scrotum is completely healed. left testicle nontender. Alert and oriented x3. normal ROM in all 4 ext's foley flushed with 120cc NS with return of one tiny clot but otherwise flushed without issue. Bedside scrotal ultrasound performed with hydrocele on right, normal appearing testicle, no obvious abscess. After sterile prepping and draping and injection with lidocaine without epi, the indurated area was aspirated with a syringe with no return of pus. hematuriainduration after removal of IPP eroded into bladder hematuria: resolved, likely from recent bladder repair. he will f/u with Dr. Yves Dill for cystogram on 1/6. taught wife how to flush foley induration: my biggest concern would be an abscess, but other than tenderness and induration, he has no other signs of infection. He has no erythema or fevers. He actually notes his swelling is better over the last week. It isn't unusual to have significant scrotal edema after eroded/infected IPP removal, but his amount of tenderness seemed more than typical this far out. Nevertheless, ultrasound and aspiration were unimpressive. I counseled him on the main si/sx's of infection and I strongly encouraged him to return to the ED with development of redness, worsening pain, worsening swelling, or fevers. He hasn't had any labs drawn so we will check a CBC for a baseline here. We will send him home with cipro and some  pain medicine. I advised him to call Dr. Letta Kocher office on Monday for repeat assessment. The patient and his wife expressed  understanding with the plan. They were appreciative of his care. All questions were answered.  Electronic Signatures: Charna Archer (MD)  (Signed on 27-Dec-14 20:57)  Authored  Last Updated: 27-Dec-14 20:57 by Charna Archer (MD)

## 2015-04-22 NOTE — H&P (Signed)
PATIENT NAME:  Luke [name], Luke [name] MR#:  [id number] DATE OF BIRTH:  1946-01-20  DATE OF ADMISSION:  01/19/2012  CHIEF COMPLAINT: Difficulty voiding.   HISTORY OF PRESENT ILLNESS: Luke [name] is a 69 year old African American male with a long history of benign prostatic hypertrophy and worsening urinary symptoms. He was found to have an elevated PSA and underwent ultrasound-guided biopsy of the prostate December 26th indicating 65 gram prostate with benign histology but presence of HGPIN bilaterally. He comes in now for photovaporization of the prostate with the green-light laser.   ALLERGIES: No drug allergies.   CURRENT MEDICATIONS:  1. Losartan.   2. Diltiazem.  PREVIOUS SURGICAL PROCEDURES: 1. Lumbar laminectomy in 1983.  2. Cholecystectomy in 2001.  3. Right nephrectomy due to angiomyolipoma in 2001. 4. Inflatable penile prosthesis placement July 2012.   SOCIAL HISTORY: He denied tobacco use. He consumes alcohol rarely.   FAMILY HISTORY: Positive for diabetes.  PAST AND CURRENT MEDICAL CONDITIONS:  1. Hypertension.  2. Hypogonadism. 3. Hyperlipidemia.  4. Diabetes. 5. Chronic back pain. 6. Solitary kidney with history of angiomyolipoma.  7. Erectile dysfunction, status post inflatable penile prosthesis placement.   REVIEW OF SYSTEMS: The patient denies chest pain or shortness of breath. He has history of tension headaches. He has chronic neck and back pain.   PHYSICAL EXAMINATION:   GENERAL: Well nourished African American male in no distress.   HEENT: Sclerae were clear. Pupils were equally round and reactive to light and accommodation. Extraocular movements were intact.   NECK: Supple. No palpable cervical adenopathy.   LUNGS: Clear to auscultation.   CARDIOVASCULAR: Regular rhythm and rate without audible murmurs or gallops.   ABDOMEN: Soft, nontender abdomen.   GENITOURINARY: Circumcised. Penile prosthesis components were in good position. Testes were atrophic,  approximately 16 mL in size each.   RECTAL: Greater than 40 gram prostate.   NEUROMUSCULAR: Nonfocal.   IMPRESSION:  1. Benign prostatic hypertrophy with bladder outlet obstruction.  2. HGPIN.  PLAN: Photovaporization of prostate with green-light laser.   ____________________________ Otelia Limes. Yves Dill, MD [email or web address] D: 01/13/2012 11:48:00 ET T: 01/13/2012 12:32:34 ET JOB#: [id number]  cc: Otelia Limes. Yves Dill, MD, <Dictator> Royston Cowper MD ELECTRONICALLY SIGNED 01/15/2012 12:41

## 2015-04-22 NOTE — Op Note (Signed)
PATIENT NAME:  Luke [name], WEIL MR#:  [id number] DATE OF BIRTH:  Aug 11, 1946  DATE OF PROCEDURE:  01/19/2012  PREOPERATIVE DIAGNOSIS: Benign prostatic hypertrophy with bladder outlet obstruction.   POSTOPERATIVE DIAGNOSIS: Benign prostatic hypertrophy with bladder outlet obstruction.   POSTOPERATIVE DIAGNOSIS: Photovaporization of the prostate with green light laser.   SURGEON: Otelia Limes. Yves Dill, MD   ANESTHETIST: Dr. [place] Service   ANESTHETIC METHOD: General.   INDICATIONS: See the dictated history and physical. After informed consent, the patient requested the above procedure.   OPERATIVE SUMMARY: After adequate general anesthesia had been obtained, the patient was placed into dorsal lithotomy position and the perineum was prepped and draped in the usual fashion. The laser scope was coupled with the camera and then visually advanced into the bladder. bladder was moderately trabeculated. No bladder mucosal lesions were identified. Both ureteral orifices were identified and had clear efflux. The patient had trilobar benign prostatic hypertrophy with estimated prostate size of 70 grams. At this point, the XPS green light laser fiber was introduced through the scope and power set at 80 watts. Median lobe and lateral bladder neck tissue was then vaporized. Power was increased to 150 watts and tissue was further vaporized from the bladder neck to the verumontanum. Power was then increased to 180 watts and remaining obstructive tissue vaporized. At this point, the scope was removed and a 20 [place] silicone catheter was placed. The catheter was irrigated until clear. The procedure was then terminated. A B and O suppository was placed. The patient was then transferred to the recovery room in stable condition.  ____________________________ Otelia Limes. Yves Dill, MD [email or web address] D: 01/19/2012 11:07:10 ET T: 01/19/2012 12:23:20 ET JOB#: [id number] cc: Otelia Limes. Yves Dill, MD, <Dictator>, Kirstie Peri. Caryn Section, MD Royston Cowper MD ELECTRONICALLY SIGNED 01/19/2012 13:09

## 2015-07-18 ENCOUNTER — Other Ambulatory Visit: Payer: Self-pay | Admitting: Urology

## 2015-07-18 DIAGNOSIS — Z125 Encounter for screening for malignant neoplasm of prostate: Secondary | ICD-10-CM | POA: Diagnosis not present

## 2015-07-18 DIAGNOSIS — M5489 Other dorsalgia: Secondary | ICD-10-CM | POA: Diagnosis not present

## 2015-07-18 DIAGNOSIS — R109 Unspecified abdominal pain: Secondary | ICD-10-CM

## 2015-07-18 DIAGNOSIS — D400 Neoplasm of uncertain behavior of prostate: Secondary | ICD-10-CM | POA: Diagnosis not present

## 2015-07-18 DIAGNOSIS — Z85520 Personal history of malignant carcinoid tumor of kidney: Secondary | ICD-10-CM

## 2015-07-18 DIAGNOSIS — N5201 Erectile dysfunction due to arterial insufficiency: Secondary | ICD-10-CM | POA: Diagnosis not present

## 2015-07-24 ENCOUNTER — Ambulatory Visit
Admission: RE | Admit: 2015-07-24 | Discharge: 2015-07-24 | Disposition: A | Discharge status: Home / Self Care | Payer: Medicare Other | Source: Ambulatory Visit | Attending: Urology | Admitting: Urology

## 2015-07-24 DIAGNOSIS — K449 Diaphragmatic hernia without obstruction or gangrene: Secondary | ICD-10-CM | POA: Diagnosis not present

## 2015-07-24 DIAGNOSIS — R948 Abnormal results of function studies of other organs and systems: Secondary | ICD-10-CM | POA: Diagnosis not present

## 2015-07-24 DIAGNOSIS — K4090 Unilateral inguinal hernia, without obstruction or gangrene, not specified as recurrent: Secondary | ICD-10-CM | POA: Diagnosis not present

## 2015-07-24 DIAGNOSIS — Z905 Acquired absence of kidney: Secondary | ICD-10-CM | POA: Insufficient documentation

## 2015-07-24 DIAGNOSIS — R109 Unspecified abdominal pain: Secondary | ICD-10-CM | POA: Diagnosis present

## 2015-07-24 DIAGNOSIS — D171 Benign lipomatous neoplasm of skin and subcutaneous tissue of trunk: Secondary | ICD-10-CM | POA: Insufficient documentation

## 2015-07-24 DIAGNOSIS — Z85520 Personal history of malignant carcinoid tumor of kidney: Secondary | ICD-10-CM | POA: Insufficient documentation

## 2015-07-24 DIAGNOSIS — C679 Malignant neoplasm of bladder, unspecified: Secondary | ICD-10-CM | POA: Diagnosis not present

## 2015-07-24 DIAGNOSIS — Z85528 Personal history of other malignant neoplasm of kidney: Secondary | ICD-10-CM | POA: Diagnosis not present

## 2015-07-24 HISTORY — DX: Malignant neoplasm of prostate: C61

## 2015-07-24 HISTORY — DX: Essential (primary) hypertension: I10

## 2015-07-24 MED ORDER — IOHEXOL 300 MG/ML  SOLN
100.0000 mL | Freq: Once | INTRAMUSCULAR | Status: AC | PRN
  Administered 2015-07-24: 100 mL via INTRAVENOUS

## 2015-07-27 DIAGNOSIS — D400 Neoplasm of uncertain behavior of prostate: Secondary | ICD-10-CM | POA: Diagnosis not present

## 2015-07-27 DIAGNOSIS — N5201 Erectile dysfunction due to arterial insufficiency: Secondary | ICD-10-CM | POA: Diagnosis not present

## 2015-08-07 DIAGNOSIS — M5489 Other dorsalgia: Secondary | ICD-10-CM | POA: Diagnosis not present

## 2015-08-07 DIAGNOSIS — D400 Neoplasm of uncertain behavior of prostate: Secondary | ICD-10-CM | POA: Diagnosis not present

## 2015-08-21 ENCOUNTER — Ambulatory Visit: Payer: Self-pay | Admitting: Family Medicine

## 2015-11-30 ENCOUNTER — Ambulatory Visit (INDEPENDENT_AMBULATORY_CARE_PROVIDER_SITE_OTHER): Payer: Medicare Other

## 2015-11-30 DIAGNOSIS — Z23 Encounter for immunization: Secondary | ICD-10-CM | POA: Diagnosis not present

## 2016-02-06 DIAGNOSIS — I739 Peripheral vascular disease, unspecified: Secondary | ICD-10-CM

## 2016-02-06 DIAGNOSIS — G4700 Insomnia, unspecified: Secondary | ICD-10-CM | POA: Insufficient documentation

## 2016-02-06 DIAGNOSIS — R002 Palpitations: Secondary | ICD-10-CM | POA: Insufficient documentation

## 2016-02-06 DIAGNOSIS — K219 Gastro-esophageal reflux disease without esophagitis: Secondary | ICD-10-CM

## 2016-02-06 DIAGNOSIS — E559 Vitamin D deficiency, unspecified: Secondary | ICD-10-CM | POA: Insufficient documentation

## 2016-02-06 DIAGNOSIS — E291 Testicular hypofunction: Secondary | ICD-10-CM

## 2016-02-06 DIAGNOSIS — Q602 Renal agenesis, unspecified: Secondary | ICD-10-CM

## 2016-02-06 DIAGNOSIS — R195 Other fecal abnormalities: Secondary | ICD-10-CM | POA: Insufficient documentation

## 2016-02-06 DIAGNOSIS — R51 Headache: Secondary | ICD-10-CM

## 2016-02-06 DIAGNOSIS — Q605 Renal hypoplasia, unspecified: Secondary | ICD-10-CM

## 2016-02-06 DIAGNOSIS — R200 Anesthesia of skin: Secondary | ICD-10-CM

## 2016-02-06 DIAGNOSIS — R519 Headache, unspecified: Secondary | ICD-10-CM | POA: Insufficient documentation

## 2016-02-06 HISTORY — DX: Gastro-esophageal reflux disease without esophagitis: K21.9

## 2016-02-06 HISTORY — DX: Anesthesia of skin: R20.0

## 2016-02-06 HISTORY — DX: Vitamin D deficiency, unspecified: E55.9

## 2016-02-06 HISTORY — DX: Testicular hypofunction: E29.1

## 2016-02-06 HISTORY — DX: Renal agenesis, unspecified: Q60.5

## 2016-02-06 HISTORY — DX: Insomnia, unspecified: G47.00

## 2016-02-06 HISTORY — DX: Renal agenesis, unspecified: Q60.2

## 2016-02-06 HISTORY — DX: Palpitations: R00.2

## 2016-02-06 HISTORY — DX: Peripheral vascular disease, unspecified: I73.9

## 2016-02-07 ENCOUNTER — Encounter: Payer: Self-pay | Admitting: Family Medicine

## 2016-02-07 ENCOUNTER — Ambulatory Visit (INDEPENDENT_AMBULATORY_CARE_PROVIDER_SITE_OTHER): Payer: Medicare Other | Admitting: Family Medicine

## 2016-02-07 VITALS — BP 126/84 | HR 69 | Temp 97.8°F | Resp 18 | Ht 72.5 in | Wt 234.0 lb

## 2016-02-07 DIAGNOSIS — C61 Malignant neoplasm of prostate: Secondary | ICD-10-CM

## 2016-02-07 DIAGNOSIS — E1129 Type 2 diabetes mellitus with other diabetic kidney complication: Secondary | ICD-10-CM | POA: Diagnosis not present

## 2016-02-07 DIAGNOSIS — K219 Gastro-esophageal reflux disease without esophagitis: Secondary | ICD-10-CM

## 2016-02-07 DIAGNOSIS — I10 Essential (primary) hypertension: Secondary | ICD-10-CM

## 2016-02-07 DIAGNOSIS — E559 Vitamin D deficiency, unspecified: Secondary | ICD-10-CM | POA: Diagnosis not present

## 2016-02-07 DIAGNOSIS — Z23 Encounter for immunization: Secondary | ICD-10-CM | POA: Diagnosis not present

## 2016-02-07 DIAGNOSIS — N529 Male erectile dysfunction, unspecified: Secondary | ICD-10-CM

## 2016-02-07 DIAGNOSIS — Z0000 Encounter for general adult medical examination without abnormal findings: Secondary | ICD-10-CM | POA: Diagnosis not present

## 2016-02-07 DIAGNOSIS — E782 Mixed hyperlipidemia: Secondary | ICD-10-CM

## 2016-02-07 DIAGNOSIS — Z905 Acquired absence of kidney: Secondary | ICD-10-CM

## 2016-02-07 MED ORDER — SERTRALINE HCL 50 MG PO TABS: 25.0000 mg | ORAL_TABLET | Freq: Every day | ORAL | Status: DC

## 2016-02-07 NOTE — Progress Notes (Signed)
Patient: Luke [name], Male    DOB: 19-Oct-1946, 70 y.o.   MRN: [id number] Visit Date: 02/07/2016  Today's Provider: Lelon Huh, MD   Chief Complaint  Patient presents with  . Medicare Wellness  . Hypertension    follow up  . Hyperlipidemia    follow up  . Diabetes    follow up   Subjective:    Annual wellness visit Luke [name] is a 70 y.o. male. He feels fairly well. He reports never exercising. He reports he is sleeping fairly well.  -----------------------------------------------------------  Diabetes Mellitus Type II, Follow-up:   Lab Results  Component Value Date   HGBA1C 6.0 02/20/2015   Last seen for diabetes 11 months ago.  Management since then includes no changes. He reports fair compliance with treatment. He is not having side effects.  Current symptoms include paresthesia of the feet and have been stable. Home blood sugar records: not being checked  Episodes of hypoglycemia? no   Current Insulin Regimen: none Most Recent Eye Exam: more that 1 year ago Weight trend: stable Prior visit with dietician: no Current diet: in general, an "unhealthy" diet Current exercise: none  ------------------------------------------------------------------------   Hypertension, follow-up:  BP Readings from Last 3 Encounters:  No data found for BP    He was last seen for hypertension 11 months ago.  Management since that visit includes changing from Losartan-HCTZ to Valsartan-HCTZ. Marland KitchenHe reports good compliance with treatment. He is not having side effects.  He is not exercising. He is not adherent to low salt diet.   Outside blood pressures are not being checked. He is experiencing lower extremity edema.  Patient denies chest pain, chest pressure/discomfort, claudication, dyspnea, exertional chest pressure/discomfort, fatigue, irregular heart beat, lower extremity edema, near-syncope, orthopnea, palpitations, paroxysmal nocturnal dyspnea, syncope  and tachypnea.   Cardiovascular risk factors include advanced age (older than 36 for men, 47 for women), hypertension, male gender and sedentary lifestyle.  Use of agents associated with hypertension: none.   ------------------------------------------------------------------------    Lipid/Cholesterol, Follow-up:   Last seen for this 1 years ago.  Management since that visit includes no changes.  Last Lipid Panel:    Component Value Date/Time   CHOL 186 02/22/2015   TRIG 263* 02/22/2015   HDL 45 02/22/2015   LDLCALC 88 02/22/2015    He reports fair compliance with treatment. He is not having side effects.   Wt Readings from Last 3 Encounters:  07/24/15 235 lb (106.595 kg)    ------------------------------------------------------------------------  Follow up Vitamin D Deficiency:  Last office visit was 1 year ago and patient was started on Vitamin D 2,000units daily.   Review of Systems  Constitutional: Negative for fever, chills, appetite change and fatigue.  HENT: Negative for congestion, ear pain, hearing loss, nosebleeds and trouble swallowing.   Eyes: Negative for pain and visual disturbance.  Respiratory: Negative for cough, chest tightness and shortness of breath.   Cardiovascular: Positive for leg swelling (right foot). Negative for chest pain and palpitations.  Gastrointestinal: Negative for nausea, vomiting, abdominal pain, diarrhea, constipation and blood in stool.  Endocrine: Negative for polydipsia, polyphagia and polyuria.  Genitourinary: Negative for dysuria and flank pain.  Musculoskeletal: Negative for myalgias, back pain, joint swelling, arthralgias and neck stiffness.  Skin: Negative for color change, rash and wound.  Neurological: Positive for numbness (in feet). Negative for dizziness, tremors, seizures, speech difficulty, weakness, light-headedness and headaches.  Psychiatric/Behavioral: Negative for behavioral problems, confusion, sleep disturbance,  dysphoric  mood and decreased concentration. The patient is not nervous/anxious.   All other systems reviewed and are negative.   Social History   Social History  . Marital Status: Married    Spouse Name: N/A  . Number of Children: 2  . Years of Education: N/A   Occupational History  . Retired    Social History Main Topics  . Smoking status: Never Smoker   . Smokeless tobacco: Not on file  . Alcohol Use: 0.0 oz/week    0 Standard drinks or equivalent per week     Comment: occasional use  . Drug Use: No  . Sexual Activity: Not on file   Other Topics Concern  . Not on file   Social History Narrative    Past Medical History  Diagnosis Date  . Prostate cancer ([place]) 2014    Seed Implants  . Diabetes mellitus without complication ([place])     Diet Controlled  . Hypertension   . Single kidney   . Hypogonadism male   . GERD (gastroesophageal reflux disease)   . Vitamin D deficiency      Patient Active Problem List   Diagnosis Date Noted  . Claudication ([place]) 02/06/2016  . GERD (gastroesophageal reflux disease) 02/06/2016  . Headache 02/06/2016  . Hypogonadism male 02/06/2016  . Insomnia 02/06/2016  . Hand numbness 02/06/2016  . Numbness of toes 02/06/2016  . Fecal occult blood test positive 02/06/2016  . Palpitations 02/06/2016  . 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 Vitamin D deficiency 02/06/2016  . LBP (low back pain) 07/13/2013  . Prostate cancer ([place]) 02/01/2010  . Diabetes mellitus with renal complications ([place]) [id number]  . Dysphagia 04/02/2009  . Hypertension 04/02/2009  . Brachial neuritis 04/02/2009  . History of MRSA infection 11/18/2007  . Hyperlipidemia, mixed 12/29/1998  . Acquired absence of kidney 12/29/1988    Past Surgical History  Procedure Laterality Date  . Photovaporization of prostate with green light laser  01/19/2012    Dr. Rogers Blocker  . Penile prosthesis implant  2008  . Penile prosthesis  removal  11/28/2013    Dr.  Eliberto Ivory  . Nephrectomy Right 2001    done by Dr. Eliberto Ivory for Angiomyolipoma  . Spine surgery  1991  . Cholecystectomy  2001  . Carotid doppler ultrasound  10/18/2012    Minimal soft plague both carotids. No hemodynamically significant stenosis  . Lumbar spine x ray  11/28/2011    Mild DDD L1- L2. Anterior heigt loss of T12-L1 vertebral bodies  . Upper gi endoscopy  07/10/2011    Done by Dr. Sharen Counter. Revealed Schatski rings  . Abi  07/09/2011    Normal; Left= 1.21, Right=1.00. Triphasic waver forms    His family history includes Heart attack in his mother; Hypertension in his father.    Previous Medications   AMLODIPINE (NORVASC) 10 MG TABLET    Take 1 tablet by mouth daily.   CHOLECALCIFEROL (VITAMIN D) 2000 UNITS CAPS    Take 1 capsule by mouth daily. Reported on 02/07/2016   LOSARTAN-HYDROCHLOROTHIAZIDE (HYZAAR) 100-12.5 MG TABLET    Take 1 tablet by mouth daily.   SERTRALINE (ZOLOFT) 50 MG TABLET    Take 50 mg by mouth at bedtime.   VITAMIN E 400 UNIT CAPSULE    Take 400 Units by mouth daily.    Patient Care Team: Birdie Sons, MD as PCP - General (Family Medicine) Royston Cowper, MD (Urology)     Objective:   Vitals: BP 126/84 mmHg  Pulse 69  Temp(Src) 97.8 F (36.6 C) (Oral)  Resp 18  Ht 6' 0.5" (1.842 m)  Wt 234 lb (106.142 kg)  BMI 31.28 kg/m2  SpO2 99%  Physical Exam   General Appearance:    Alert, cooperative, no distress, appears stated age  Head:    Normocephalic, without obvious abnormality, atraumatic  Eyes:    PERRL, conjunctiva/corneas clear, EOM's intact, fundi    benign, both eyes       Ears:    Normal TM's and external ear canals, both ears  Nose:   Nares normal, septum midline, mucosa normal, no drainage   or sinus tenderness  Throat:   Lips, mucosa, and tongue normal; teeth and gums normal  Neck:   Supple, symmetrical, trachea midline, no adenopathy;       thyroid:  No enlargement/tenderness/nodules; no carotid   bruit or JVD  Back:      Symmetric, no curvature, ROM normal, no CVA tenderness  Lungs:     Clear to auscultation bilaterally, respirations unlabored  Chest wall:    No tenderness or deformity  Heart:    Regular rate and rhythm, S1 and S2 normal, no murmur, rub   or gallop  Abdomen:     Soft, non-tender, bowel sounds active all four quadrants,    no masses, no organomegaly  Genitalia:    deferred  Rectal:    deferred  Extremities:   Extremities normal, atraumatic, no cyanosis or edema  Pulses:   2+ and symmetric all extremities  Skin:   Skin color, texture, turgor normal, no rashes or lesions  Lymph nodes:   Cervical, supraclavicular, and axillary nodes normal  Neurologic:   CNII-XII intact. Normal strength, sensation and reflexes      throughout    Activities of Daily Living In your present state of health, do you have any difficulty performing the following activities: 02/07/2016  Hearing? Y  Vision? N  Difficulty concentrating or making decisions? Y  Walking or climbing stairs? N  Dressing or bathing? N  Doing errands, shopping? N    Fall Risk Assessment Fall Risk  02/07/2016  Falls in the past year? No     Depression Screen PHQ 2/9 Scores 02/07/2016  PHQ - 2 Score 0    Cognitive Testing - 6-CIT  Correct? Score   What year is it? yes 0 0 or 4  What month is it? yes 0 0 or 3  Memorize:    Pia Mau,  43,  [place],      What time is it? (within 1 hour) yes 0 0 or 3  Count backwards from 20 no 2 0, 2, or 4  Name the months of the year no 4 0, 2, or 4  Repeat name & address above no 4 0, 2, 4, 6, 8, or 10       TOTAL SCORE  10/28   Interpretation:  Abnormal- .  Normal (0-7) Abnormal (8-28)   Audit-C Alcohol Use Screening  Question Answer Points  How often do you have alcoholic drink? 2-4 times monthly 2  On days you do drink alcohol, how many drinks do you typically consume? 1 or 2 0  How oftey will you drink 6 or more in a total? 1 times monthly 2  Total Score:  4   A score  of 3 or more in women, and 4 or more in men indicates increased risk for alcohol abuse, EXCEPT if all of the points are  from question 1.       Assessment & Plan:     Annual Wellness Visit  Reviewed patient's Family Medical History Reviewed and updated list of patient's medical providers Assessment of cognitive impairment was done Assessed patient's functional ability Established a written schedule for health screening [place] Completed and Reviewed  Exercise Activities and Dietary recommendations Goals    None      Immunization History  Administered Date(s) Administered  . Influenza, High Dose Seasonal PF 11/30/2015  . Influenza-Unspecified 11/28/2013  . Pneumococcal Polysaccharide-23 04/30/2010  . Tdap 04/30/2010    Health Maintenance  Topic Date Due  . Hepatitis C Screening  [birth year]  . FOOT EXAM  04/02/1957  . OPHTHALMOLOGY EXAM  04/02/1957  . ZOSTAVAX  04/03/2007  . PNA vac Low Risk Adult (1 of 2 - PCV13) 04/02/2012  . HEMOGLOBIN A1C  08/21/2015  . INFLUENZA VACCINE  07/29/2016  . TETANUS/TDAP  04/30/2020  . COLONOSCOPY  04/07/2024      Discussed health benefits of physical activity, and encouraged him to engage in regular exercise appropriate for his age and condition.    ------------------------------------------------------------------------------------------------------------  1. Medicare annual wellness visit, subsequent   2. Essential hypertension Stable. Continue current medications.   - EKG 12-Lead - Renal function panel  3. Gastroesophageal reflux disease without esophagitis Well controlled.  Continue current medications.    4. Type 2 diabetes mellitus with other diabetic kidney complication, without long-term current use of insulin (HCC)  - Lipid panel - Hemoglobin A1c  5. Prostate cancer [facility]) Previously followed by Dr. Eliberto Ivory, but he would like to establish with a different urologist.  - PSA  6. Acquired  absence of kidney   7. Vitamin D deficiency Having occasional numbness in feet - VITAMIN D 25 Hydroxy (Vit-D Deficiency, Fractures)  8. Hyperlipidemia, mixed LIpids  9. Erectile dysfunction, unspecified erectile dysfunction type Continues to be a significant problem which is somewhat distressed about. Has had implants, tried pump devices and several medications with little or no success. He would like evaluation by a different urologist to see if there any other options or evaluation needed.  - Ambulatory referral to Urology  10. Need for pneumococcal vaccination [id number]

## 2016-02-08 ENCOUNTER — Encounter: Payer: Self-pay | Admitting: Family Medicine

## 2016-02-08 DIAGNOSIS — R7303 Prediabetes: Secondary | ICD-10-CM

## 2016-02-08 HISTORY — DX: Prediabetes: R73.03

## 2016-02-08 LAB — LIPID PANEL
CHOLESTEROL TOTAL: 165 mg/dL (ref 100–199)
Chol/HDL Ratio: 3.7 ratio units (ref 0.0–5.0)
HDL: 45 mg/dL (ref >39)
LDL Calculated: 92 mg/dL (ref 0–99)
Triglycerides: 140 mg/dL (ref 0–149)
VLDL Cholesterol Cal: 28 mg/dL (ref 5–40)

## 2016-02-08 LAB — RENAL FUNCTION PANEL
ALBUMIN: 4.5 g/dL (ref 3.6–4.8)
BUN/Creatinine Ratio: 11 (ref 10–22)
BUN: 16 mg/dL (ref 8–27)
CALCIUM: 10.4 mg/dL — AB (ref 8.6–10.2)
CHLORIDE: 100 mmol/L (ref 96–106)
CO2: 26 mmol/L (ref 18–29)
Creatinine, Ser: 1.4 mg/dL — ABNORMAL HIGH (ref 0.76–1.27)
GFR calc Af Amer: 59 mL/min/{1.73_m2} — ABNORMAL LOW (ref >59)
GFR calc non Af Amer: 51 mL/min/{1.73_m2} — ABNORMAL LOW (ref >59)
Glucose: 91 mg/dL (ref 65–99)
Phosphorus: 2.9 mg/dL (ref 2.5–4.5)
Potassium: 4.5 mmol/L (ref 3.5–5.2)
SODIUM: 142 mmol/L (ref 134–144)

## 2016-02-08 LAB — PSA: Prostate Specific Ag, Serum: 0.5 ng/mL (ref 0.0–4.0)

## 2016-02-08 LAB — HEMOGLOBIN A1C
Est. average glucose Bld gHb Est-mCnc: 131 mg/dL
Hgb A1c MFr Bld: 6.2 % — ABNORMAL HIGH (ref 4.8–5.6)

## 2016-02-08 LAB — VITAMIN D 25 HYDROXY (VIT D DEFICIENCY, FRACTURES): VIT D 25 HYDROXY: 32 ng/mL (ref 30.0–100.0)

## 2016-02-12 ENCOUNTER — Ambulatory Visit (INDEPENDENT_AMBULATORY_CARE_PROVIDER_SITE_OTHER): Payer: Medicare Other | Admitting: Urology

## 2016-02-12 ENCOUNTER — Encounter: Payer: Self-pay | Admitting: Urology

## 2016-02-12 VITALS — BP 142/54 | HR 67 | Ht 72.0 in | Wt 234.1 lb

## 2016-02-12 DIAGNOSIS — C61 Malignant neoplasm of prostate: Secondary | ICD-10-CM | POA: Diagnosis not present

## 2016-02-12 DIAGNOSIS — N5235 Erectile dysfunction following radiation therapy: Secondary | ICD-10-CM | POA: Insufficient documentation

## 2016-02-12 DIAGNOSIS — Q600 Renal agenesis, unilateral: Secondary | ICD-10-CM | POA: Diagnosis not present

## 2016-02-12 DIAGNOSIS — IMO0002 Reserved for concepts with insufficient information to code with codable children: Secondary | ICD-10-CM

## 2016-02-12 NOTE — Progress Notes (Signed)
02/12/2016 9:46 AM   Luke [name] 1946-09-13 [id number]  Referring provider: Birdie Sons, MD [address] [place], [place] [address]  Chief Complaint  Patient presents with  . Erectile Dysfunction    New Patient    HPI:  1 - Moderate Risk Prostate Cancer - s/p I125 brachy by Eliberto Ivory 2014 for Gleason 7 disease. Pre-treatment PSA unknown.  Recent PSA History: 01/2016 PSA 0.5  2 - Right Angiomyolipoma / Solitary Left Kidney - s/p open right nephrectomy 2001 by Eliberto Ivory for angiomyolipoma per report. Cr 2017 1.4  3 - Severe Erectile Dysfunction  - long h/o ED. Per history sound like was given some sort of hormone therapy which of course was innefective, then oral meds also innefective, then penile prosthesis around 2013 by Eliberto Ivory with subsequent total explant for resevoir erosion in to bladder 2014. Now using VED with moderate satisfaction. Libido preserved.   PMH sig for HTN, GERD. HIs PCP is Bess Harvest MD with [facility]  Today  "Luke [name]" is seen as new pateint for above.    PMH: Past Medical History  Diagnosis Date  . Prostate cancer ([place]) 2014    Seed Implants  . Single kidney   . Hypertension 04/02/2009  . Dysphagia 04/02/2009  . GERD (gastroesophageal reflux disease) 02/06/2016    Schatski ring seen on EGD 07-10-11   . Hypogonadism male 02/06/2016  . Brachial neuritis 04/02/2009  . 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 Acquired absence of kidney 12/29/1988  . Claudication ([place]) 02/06/2016  . History of MRSA infection 11/18/2007  . Hyperlipidemia, mixed 12/29/1998  . Insomnia 02/06/2016  . LBP (low back pain) 07/13/2013  . Numbness of toes 02/06/2016  . Palpitations 02/06/2016  . Prediabetes 02/08/2016    A1c=6.2 02/08/16   . Vitamin D deficiency 02/06/2016    Surgical History: Past Surgical History  Procedure Laterality Date  . Photovaporization of prostate with green light laser  01/19/2012    Dr. Rogers Blocker  . Penile prosthesis implant   2008  . Penile prosthesis  removal  11/28/2013    Dr. Eliberto Ivory  . Nephrectomy Right 2001    done by Dr. Eliberto Ivory for Angiomyolipoma  . Spine surgery  1991  . Cholecystectomy  2001  . Carotid doppler ultrasound  10/18/2012    Minimal soft plague both carotids. No hemodynamically significant stenosis  . Lumbar spine x ray  11/28/2011    Mild DDD L1- L2. Anterior heigt loss of T12-L1 vertebral bodies  . Upper gi endoscopy  07/10/2011    Done by Dr. Sharen Counter. Revealed Schatski rings  . Abi  07/09/2011    Normal; Left= 1.21, Right=1.00. Triphasic waver forms    Home Medications:    Medication List       This list is accurate as of: 02/12/16  9:46 AM.  Always use your most recent med list.               amLODipine 10 MG tablet  Commonly known as:  NORVASC  Take 1 tablet by mouth daily.     losartan-hydrochlorothiazide 100-12.5 MG tablet  Commonly known as:  HYZAAR  Take 1 tablet by mouth daily.     Vitamin D 2000 units Caps  Take 1 capsule by mouth daily. Reported on 02/07/2016        Allergies: No Known Allergies  Family History: Family History  Problem Relation Age of Onset  . Heart attack Mother   . Hypertension Father   .  Prostate cancer Neg Hx   . Bladder Cancer Neg Hx   . Kidney cancer Neg Hx     Social History:  reports that he has never smoked. He does not have any smokeless tobacco history on file. He reports that he drinks alcohol. He reports that he does not use illicit drugs.  ROS: UROLOGY Frequent Urination?: No Hard to postpone urination?: Yes Burning/pain with urination?: No Get up at night to urinate?: Yes Leakage of urine?: No Urine stream starts and stops?: No Trouble starting stream?: No Do you have to strain to urinate?: No Blood in urine?: No Urinary tract infection?: No Sexually transmitted disease?: No Injury to kidneys or bladder?: No Painful intercourse?: No Weak stream?: No Erection problems?: Yes Penile pain?:  No  Gastrointestinal Nausea?: No Vomiting?: No Indigestion/heartburn?: No Diarrhea?: No Constipation?: No  Constitutional Fever: No Night sweats?: No Weight loss?: No Fatigue?: No  Skin Skin rash/lesions?: No Itching?: No  Eyes Blurred vision?: Yes Double vision?: No  Ears/Nose/Throat Sore throat?: No Sinus problems?: No  Hematologic/Lymphatic Swollen glands?: No Easy bruising?: No  Cardiovascular Leg swelling?: No Chest pain?: No  Respiratory Cough?: No Shortness of breath?: No  Endocrine Excessive thirst?: No  Musculoskeletal Back pain?: Yes Joint pain?: Yes  Neurological Headaches?: No Dizziness?: No  Psychologic Depression?: No Anxiety?: No  Physical Exam: There were no vitals taken for this visit.  Constitutional:  Alert and oriented, No acute distress. HEENT: [place] AT, moist mucus membranes.  Trachea midline, no masses. Cardiovascular: No clubbing, cyanosis, or edema. Respiratory: Normal respiratory effort, no increased work of breathing. GI: Abdomen is soft, nontender, nondistended, no abdominal masses GU: No CVA tenderness. Lower midline incision w/o hernia. circ'd phallus. Testes w/o mases bilaterally Skin: No rashes, bruises or suspicious lesions. Lymph: No cervical or inguinal adenopathy. Neurologic: Grossly intact, no focal deficits, moving all 4 extremities. Psychiatric: Normal mood and affect.  Laboratory Data: Lab Results  Component Value Date   WBC 9.6 12/24/2013   HGB 11.2* 12/24/2013   HCT 34.8* 12/24/2013   MCV 66* 12/24/2013   PLT 331 12/24/2013    Lab Results  Component Value Date   CREATININE 1.40* 02/07/2016    No results found for: PSA  No results found for: TESTOSTERONE  Lab Results  Component Value Date   HGBA1C 6.2* 02/07/2016    Urinalysis    Component Value Date/Time   COLORURINE Amber 12/24/2013 1538   APPEARANCEUR Cloudy 12/24/2013 1538   LABSPEC 1.029 12/24/2013 1538   PHURINE 6.0 12/24/2013  1538   GLUCOSEU Negative 12/24/2013 1538   HGBUR 3+ 12/24/2013 1538   BILIRUBINUR Negative 12/24/2013 1538   KETONESUR Negative 12/24/2013 1538   PROTEINUR 100 mg/dL 12/24/2013 1538   NITRITE Negative 12/24/2013 1538   LEUKOCYTESUR 3+ 12/24/2013 1538    Pertinent Imaging: As per HPI  Assessment & Plan:    1 - Moderate Risk Prostate Cancer - good biochemical control, rec yearly surveillance.  2 - Right Angiomyolipoma / Solitary Left Kidney - excellent GFR measure, explained importance of BP control which he is compliant with.   3 - Severe Erectile Dysfunction  - limited options after prosthesis explant. Re-implant notoriously hazardous due to scarring. He is doing well with VED and wants to conitnue, this is clearly his safest option. Also discussed trial of self-injection, understanding that he may not have great response as he likely has corporal scarring, and he does not want to try  4 - RTC 1 year with PSA prior.  No Follow-up on file.  Alexis Frock, [facility] [address] [place], [place] [address] [phone]

## 2016-03-07 DIAGNOSIS — H0259 Other disorders affecting eyelid function: Secondary | ICD-10-CM | POA: Diagnosis not present

## 2016-05-12 ENCOUNTER — Encounter: Payer: Self-pay | Admitting: Family Medicine

## 2016-05-12 ENCOUNTER — Ambulatory Visit (INDEPENDENT_AMBULATORY_CARE_PROVIDER_SITE_OTHER): Payer: Medicare Other | Admitting: Family Medicine

## 2016-05-12 VITALS — BP 144/54 | HR 63 | Temp 98.3°F | Resp 16 | Ht 72.5 in | Wt 242.0 lb

## 2016-05-12 DIAGNOSIS — R1030 Lower abdominal pain, unspecified: Secondary | ICD-10-CM | POA: Insufficient documentation

## 2016-05-12 DIAGNOSIS — R1032 Left lower quadrant pain: Secondary | ICD-10-CM | POA: Diagnosis not present

## 2016-05-12 DIAGNOSIS — M5442 Lumbago with sciatica, left side: Secondary | ICD-10-CM | POA: Diagnosis not present

## 2016-05-12 LAB — POCT URINALYSIS DIPSTICK
Bilirubin, UA: NEGATIVE
Blood, UA: NEGATIVE
GLUCOSE UA: NEGATIVE
Ketones, UA: NEGATIVE
LEUKOCYTES UA: NEGATIVE
NITRITE UA: NEGATIVE
PH UA: 6
Protein, UA: NEGATIVE
Spec Grav, UA: >=1.03
Urobilinogen, UA: 0.2

## 2016-05-12 NOTE — Progress Notes (Signed)
Patient:  Luke [name] Male    DOB: 01/24/1946   70 y.o.   MRN: [id number] Visit Date: 05/12/2016  Today's Provider: Lelon Huh, MD   Chief Complaint  Patient presents with  . Abdominal Pain   Subjective:    Abdominal Pain This is a new problem. The current episode started more than 1 month ago. The onset quality is gradual. The problem occurs intermittently. The problem has been unchanged. The pain is located in the LLQ and left flank. The pain is mild. The quality of the pain is aching. Associated symptoms include frequency, headaches and myalgias. Pertinent negatives include no belching, constipation, diarrhea, dysuria, fever, flatus, hematochezia, hematuria, melena, nausea or vomiting.  Leg Pain  The incident occurred more than 1 week ago (over 3 weeks). There was no injury mechanism. The pain is present in the left leg and right leg. The quality of the pain is described as aching. The pain is at a severity of 9/10. The pain is severe. The pain has been intermittent since onset. Associated symptoms include an inability to bear weight, muscle weakness, numbness and tingling. He reports no foreign bodies present. The symptoms are aggravated by movement and weight bearing. He has tried rest and acetaminophen for the symptoms. The treatment provided mild relief.    States that pain is worse in inguinal area and has been present for months or years, but much more severe lately. This weekend was walking in parking lot and pain became so severe, and radiating into leg that he had to go back to car and sit down. Is always worse when walking. No relationship to food or bowels.   He does have history of lumbar disk bulging, last MRI in 2014, with remote history of lumbar surgery.     No Known Allergies Previous Medications   AMLODIPINE (NORVASC) 10 MG TABLET    Take 1 tablet by mouth daily.   CHOLECALCIFEROL (VITAMIN D) 2000 UNITS CAPS    Take 1 capsule by mouth daily. Reported on  02/07/2016   LOSARTAN-HYDROCHLOROTHIAZIDE (HYZAAR) 100-12.5 MG TABLET    Take 1 tablet by mouth daily.   SERTRALINE (ZOLOFT) 50 MG TABLET    Take 50 mg by mouth daily.   SILDENAFIL (REVATIO) 20 MG TABLET    Take 20 mg by mouth 3 (three) times daily.   VITAMIN E 100 UNIT CAPSULE    Take by mouth daily.    Review of Systems  Constitutional: Negative for fever, chills and appetite change.  Respiratory: Negative for chest tightness, shortness of breath and wheezing.   Cardiovascular: Negative for chest pain and palpitations.  Gastrointestinal: Positive for abdominal pain. Negative for nausea, vomiting, diarrhea, constipation, melena, hematochezia and flatus.  Genitourinary: Positive for frequency. Negative for dysuria and hematuria.  Musculoskeletal: Positive for myalgias.  Neurological: Positive for tingling, numbness and headaches.    Social History  Substance Use Topics  . Smoking status: Never Smoker   . Smokeless tobacco: Not on file  . Alcohol Use: 0.0 oz/week    0 Standard drinks or equivalent per week     Comment: occasional use   Objective:   BP 144/54 mmHg  Pulse 63  Temp(Src) 98.3 F (36.8 C) (Oral)  Resp 16  Ht 6' 0.5" (1.842 m)  Wt 242 lb (109.77 kg)  BMI 32.35 kg/m2  SpO2 98%  Physical Exam   General Appearance:    Alert, cooperative, no distress  Eyes:    PERRL, conjunctiva/corneas clear,  EOM's intact       Lungs:     Clear to auscultation bilaterally, respirations unlabored  Heart:    Regular rate and rhythm  Neurologic:   Awake, alert, oriented x 3. No apparent focal neurological           defect.   MS   Mild tenderness of lumbar spine. Pain with internal and external hip rotation.   Abd:    Soft, nontender, no masses, no hernia palpated.        Assessment & Plan:     1. Inguinal pain, left He indicates pain radiating into testicular area, but there is no tenderness or hernias noted upon palpation. Suspect referred pain. Consider further urological  evaluation  - POCT urinalysis dipstick - DG Lumbar Spine Complete; Future - DG HIP UNILAT WITH PELVIS 2-3 VIEWS LEFT; Future  2. Low back pain with radiation, left He does have history of lumbar disk disease. Consider course of prednisone after Reviewing Xrays.  - POCT urinalysis dipstick - DG Lumbar Spine Complete; Future - DG HIP UNILAT WITH PELVIS 2-3 VIEWS LEFT; Future       Lelon Huh, MD  [facility]

## 2016-05-12 NOTE — Patient Instructions (Signed)
Go to the [facility] on [facility] for Lumbar spine and hip Xray

## 2016-05-13 ENCOUNTER — Ambulatory Visit
Admission: RE | Admit: 2016-05-13 | Discharge: 2016-05-13 | Disposition: A | Discharge status: Home / Self Care | Payer: Medicare Other | Source: Ambulatory Visit | Attending: Family Medicine | Admitting: Family Medicine

## 2016-05-13 DIAGNOSIS — M2578 Osteophyte, vertebrae: Secondary | ICD-10-CM | POA: Insufficient documentation

## 2016-05-13 DIAGNOSIS — M5442 Lumbago with sciatica, left side: Secondary | ICD-10-CM

## 2016-05-13 DIAGNOSIS — R1032 Left lower quadrant pain: Secondary | ICD-10-CM | POA: Diagnosis not present

## 2016-05-13 DIAGNOSIS — M5136 Other intervertebral disc degeneration, lumbar region: Secondary | ICD-10-CM | POA: Diagnosis not present

## 2016-05-13 DIAGNOSIS — M47816 Spondylosis without myelopathy or radiculopathy, lumbar region: Secondary | ICD-10-CM | POA: Diagnosis not present

## 2016-05-13 DIAGNOSIS — M160 Bilateral primary osteoarthritis of hip: Secondary | ICD-10-CM | POA: Diagnosis not present

## 2016-05-13 DIAGNOSIS — M5137 Other intervertebral disc degeneration, lumbosacral region: Secondary | ICD-10-CM | POA: Diagnosis not present

## 2016-05-13 DIAGNOSIS — M1612 Unilateral primary osteoarthritis, left hip: Secondary | ICD-10-CM | POA: Diagnosis not present

## 2016-05-15 ENCOUNTER — Telehealth: Payer: Self-pay | Admitting: Family Medicine

## 2016-05-15 NOTE — Telephone Encounter (Signed)
Patient would like a call back  Today   with results from his xrays done on 05/13/16 please

## 2016-05-16 MED ORDER — MELOXICAM 15 MG PO TABS: 15.0000 mg | ORAL_TABLET | Freq: Every day | ORAL | Status: DC

## 2016-05-16 NOTE — Telephone Encounter (Signed)
Please see result note 

## 2016-05-16 NOTE — Telephone Encounter (Signed)
-----   Message from Birdie Sons, MD sent at 05/16/2016  9:27 AM EDT ----- Moderate amount of arthritis in hip with some spurs. Mild arthritis in lumbar spine. I think pain is due to hip. Recommend he try prescription for meloxicam 15mg  once a day, #30 and follow up in 3-4 weeks.

## 2016-05-16 NOTE — Telephone Encounter (Signed)
Patient's wife [name] was notified of results. Expressed understanding. Rx sent to pharmacy.

## 2016-06-12 ENCOUNTER — Other Ambulatory Visit: Payer: Self-pay | Admitting: Family Medicine

## 2016-06-18 ENCOUNTER — Other Ambulatory Visit: Payer: Self-pay | Admitting: Family Medicine

## 2016-09-09 ENCOUNTER — Ambulatory Visit (INDEPENDENT_AMBULATORY_CARE_PROVIDER_SITE_OTHER): Payer: Medicare Other | Admitting: Family Medicine

## 2016-09-09 ENCOUNTER — Ambulatory Visit: Payer: Medicare Other | Admitting: Family Medicine

## 2016-09-09 ENCOUNTER — Encounter: Payer: Self-pay | Admitting: Family Medicine

## 2016-09-09 VITALS — BP 126/90 | HR 60 | Temp 97.9°F | Resp 16 | Wt 236.0 lb

## 2016-09-09 DIAGNOSIS — F0390 Unspecified dementia without behavioral disturbance: Secondary | ICD-10-CM | POA: Diagnosis not present

## 2016-09-09 DIAGNOSIS — E1129 Type 2 diabetes mellitus with other diabetic kidney complication: Secondary | ICD-10-CM

## 2016-09-09 DIAGNOSIS — E559 Vitamin D deficiency, unspecified: Secondary | ICD-10-CM

## 2016-09-09 DIAGNOSIS — I10 Essential (primary) hypertension: Secondary | ICD-10-CM

## 2016-09-09 DIAGNOSIS — Z23 Encounter for immunization: Secondary | ICD-10-CM | POA: Diagnosis not present

## 2016-09-09 LAB — POCT GLYCOSYLATED HEMOGLOBIN (HGB A1C)
Est. average glucose Bld gHb Est-mCnc: 123
HEMOGLOBIN A1C: 5.9

## 2016-09-09 NOTE — Progress Notes (Signed)
Patient: Luke [name] Male    DOB: June 07, 1946   70 y.o.   MRN: [id number] Visit Date: 09/09/2016  Today's Provider: Lelon Huh, MD   Chief Complaint  Patient presents with  . Diabetes  . Hypertension  . Gastroesophageal Reflux   Subjective:    HPI  Diabetes Mellitus Type II, Follow-up:   Lab Results  Component Value Date   HGBA1C 5.9 09/09/2016   HGBA1C 6.2 (H) 02/07/2016   HGBA1C 6.0 02/20/2015    Last seen for diabetes 7 months ago.  Management since then includes no change. He reports excellent compliance with treatment. He is not having side effects.  Current symptoms include paresthesia of the feet and have been stable. Home blood sugar records: not being checked  Episodes of hypoglycemia? no   Current Insulin Regimen:  Most Recent Eye Exam:  Weight trend: stable Prior visit with dietician: no Current diet: in general, a "healthy" diet   Current exercise: bicycling  Pertinent Labs:    Component Value Date/Time   CHOL 165 02/07/2016 1120   TRIG 140 02/07/2016 1120   HDL 45 02/07/2016 1120   LDLCALC 92 02/07/2016 1120   CREATININE 1.40 (H) 02/07/2016 1120   CREATININE 1.55 (H) 12/24/2013 2103    Wt Readings from Last 3 Encounters:  09/09/16 236 lb (107 kg)  05/12/16 242 lb (109.8 kg)  02/12/16 234 lb 1.6 oz (106.2 kg)    ------------------------------------------------------------------------   Hypertension, follow-up:  BP Readings from Last 3 Encounters:  09/09/16 126/90  05/12/16 (!) 144/54  02/12/16 (!) 142/54    He was last seen for hypertension 7 months ago.  BP at that visit was 126/84. Management changes since that visit include no change. He reports fair compliance with treatment. He is not having side effects.  He is exercising. He is adherent to low salt diet.   Outside blood pressures are stable. He is experiencing none.  Patient denies none.   Cardiovascular risk factors include advanced age (older than 15 for  men, 39 for women), diabetes mellitus and hypertension.  Use of agents associated with hypertension: none.     Weight trend: stable Wt Readings from Last 3 Encounters:  09/09/16 236 lb (107 kg)  05/12/16 242 lb (109.8 kg)  02/12/16 234 lb 1.6 oz (106.2 kg)    Current diet: in general, a "healthy" diet  , high fat/ cholesterol  ------------------------------------------------------------------------   Follow up for GERD  The patient was last seen for this 7 months ago. Changes made at last visit include no changes.  He reports excellent compliance with treatment. He feels that condition is Improved. He is not having side effects.   ------------------------------------------------------------------------------------  Memory problems: Patient and wife are c/o of memory loss times 2-3 months. Patient's wife reports symptoms are worsening in the last couple of week. He was noted to have abnormal score on 6-CIT screening at his [facility] in February. She reports he is having increasing difficulties recognizing faces and occasionally getting confused when driving.       No Known Allergies   Current Outpatient Prescriptions:  .  amLODipine (NORVASC) 10 MG tablet, TAKE 1 TABLET BY MOUTH DAILY, Disp: 90 tablet, Rfl: 4 .  Cholecalciferol (VITAMIN D) 2000 units CAPS, Take 1 capsule by mouth daily. Reported on 02/07/2016, Disp: , Rfl:  .  losartan-hydrochlorothiazide (HYZAAR) 100-12.5 MG tablet, Take 1 tablet by mouth daily. Reported on 05/12/2016, Disp: , Rfl:  .  meloxicam (MOBIC) 15 MG  tablet, Take 1 tablet (15 mg total) by mouth daily as needed for pain., Disp: 30 tablet, Rfl: 5 .  sertraline (ZOLOFT) 50 MG tablet, Take 50 mg by mouth daily., Disp: , Rfl:  .  sildenafil (REVATIO) 20 MG tablet, Take 20 mg by mouth 3 (three) times daily., Disp: , Rfl:  .  vitamin E 100 UNIT capsule, Take by mouth daily., Disp: , Rfl:   Review of Systems  Constitutional: Negative.   Cardiovascular: Negative.    Endocrine: Negative.   Musculoskeletal: Negative.   Neurological: Positive for numbness.  Psychiatric/Behavioral: Positive for confusion.    Social History  Substance Use Topics  . Smoking status: Never Smoker  . Smokeless tobacco: Never Used  . Alcohol use 0.0 oz/week     Comment: occasional use   Objective:   BP 126/90 (BP Location: Left Arm, Patient Position: Sitting, Cuff Size: Large)   Pulse 60   Temp 97.9 F (36.6 C) (Oral)   Resp 16   Wt 236 lb (107 kg)   SpO2 100%   BMI 31.57 kg/m   Physical Exam   General Appearance:    Alert, cooperative, no distress  Eyes:    PERRL, conjunctiva/corneas clear, EOM's intact       Lungs:     Clear to auscultation bilaterally, respirations unlabored  Heart:    Regular rate and rhythm  Neurologic:   Awake, alert, oriented x 2. No apparent focal neurological           defect.         Results for orders placed or performed in visit on 09/09/16  POCT glycosylated hemoglobin (Hb A1C)  Result Value Ref Range   Hemoglobin A1C 5.9    Est. average glucose Bld gHb Est-mCnc 123        Assessment & Plan:     1. Type 2 diabetes mellitus with other diabetic kidney complication, without long-term current use of insulin (HCC) Well controlled.  Continue current medications.   - POCT glycosylated hemoglobin (Hb A1C)  2. Essential hypertension Well controlled.  Continue current medications.    3. Dementia, without behavioral disturbance Consider neuro-imagine if labs are normal.   - Comprehensive metabolic panel - CBC - RPR - Vitamin B12 - T4 AND TSH  4. Vitamin D deficiency  - VITAMIN D 25 Hydroxy (Vit-D Deficiency, Fractures)  5. Needs flu shot  - Flu vaccine HIGH DOSE PF     The entirety of the information documented in the History of Present Illness, Review of Systems and Physical Exam were personally obtained by me. Portions of this information were initially documented by Lynford Humphrey, CMA and reviewed by me for  thoroughness and accuracy.    Lelon Huh, MD  [facility]

## 2016-09-10 ENCOUNTER — Telehealth: Payer: Self-pay

## 2016-09-10 LAB — CBC
HEMATOCRIT: 36.3 % — AB (ref 37.5–51.0)
HEMOGLOBIN: 12.1 g/dL — AB (ref 12.6–17.7)
MCH: 21.8 pg — AB (ref 26.6–33.0)
MCHC: 33.3 g/dL (ref 31.5–35.7)
MCV: 65 fL — ABNORMAL LOW (ref 79–97)
Platelets: 287 10*3/uL (ref 150–379)
RBC: 5.56 x10E6/uL (ref 4.14–5.80)
RDW: 16.3 % — AB (ref 12.3–15.4)
WBC: 5.8 10*3/uL (ref 3.4–10.8)

## 2016-09-10 LAB — COMPREHENSIVE METABOLIC PANEL
A/G RATIO: 1.7 (ref 1.2–2.2)
ALT: 6 IU/L (ref 0–44)
AST: 11 IU/L (ref 0–40)
Albumin: 4.4 g/dL (ref 3.6–4.8)
Alkaline Phosphatase: 90 IU/L (ref 39–117)
BUN/Creatinine Ratio: 9 — ABNORMAL LOW (ref 10–24)
BUN: 13 mg/dL (ref 8–27)
Bilirubin Total: 0.6 mg/dL (ref 0.0–1.2)
CO2: 26 mmol/L (ref 18–29)
CREATININE: 1.4 mg/dL — AB (ref 0.76–1.27)
Calcium: 9.8 mg/dL (ref 8.6–10.2)
Chloride: 101 mmol/L (ref 96–106)
GFR calc Af Amer: 59 mL/min/{1.73_m2} — ABNORMAL LOW (ref >59)
GFR calc non Af Amer: 51 mL/min/{1.73_m2} — ABNORMAL LOW (ref >59)
GLOBULIN, TOTAL: 2.6 g/dL (ref 1.5–4.5)
Glucose: 83 mg/dL (ref 65–99)
POTASSIUM: 3.9 mmol/L (ref 3.5–5.2)
SODIUM: 141 mmol/L (ref 134–144)
Total Protein: 7 g/dL (ref 6.0–8.5)

## 2016-09-10 LAB — RPR: RPR Ser Ql: NONREACTIVE

## 2016-09-10 LAB — VITAMIN B12: Vitamin B-12: 486 pg/mL (ref 211–946)

## 2016-09-10 LAB — T4 AND TSH
T4, Total: 6.2 ug/dL (ref 4.5–12.0)
TSH: 2.07 u[IU]/mL (ref 0.450–4.500)

## 2016-09-10 LAB — VITAMIN D 25 HYDROXY (VIT D DEFICIENCY, FRACTURES): VIT D 25 HYDROXY: 27.2 ng/mL — AB (ref 30.0–100.0)

## 2016-09-10 NOTE — Telephone Encounter (Signed)
NA. LMTCB 

## 2016-09-10 NOTE — Telephone Encounter (Signed)
-----   Message from Birdie Sons, MD sent at 09/10/2016  7:52 AM EDT ----- Vitamin D levels are slightly low, but not enough that it should affect his memory. Should take OTC vitamin D3, 2000 units daily.  Recommend proceed with MRI brain due to poor memory  (Rule out cerebral ischemia, mass, hydrocephalus)

## 2016-09-10 NOTE — Telephone Encounter (Signed)
Patient's wife advised as below. Mrs. Luke [name] reports that they will get back to [place] regarding the MRI. sd

## 2016-09-27 ENCOUNTER — Other Ambulatory Visit: Payer: Self-pay | Admitting: Family Medicine

## 2016-10-14 ENCOUNTER — Telehealth: Payer: Self-pay | Admitting: Family Medicine

## 2016-10-14 DIAGNOSIS — R413 Other amnesia: Secondary | ICD-10-CM

## 2016-10-14 NOTE — Telephone Encounter (Signed)
Pt's wife [name] states that at last office visit you had mentioned getting a brain scan because pt is having some memory loss.She would like to go ahead with test

## 2016-10-14 NOTE — Telephone Encounter (Signed)
Please advise 

## 2016-10-16 DIAGNOSIS — F0390 Unspecified dementia without behavioral disturbance: Secondary | ICD-10-CM | POA: Insufficient documentation

## 2016-10-23 ENCOUNTER — Ambulatory Visit
Admission: RE | Admit: 2016-10-23 | Discharge: 2016-10-23 | Disposition: A | Discharge status: Home / Self Care | Payer: Medicare Other | Source: Ambulatory Visit | Attending: Family Medicine | Admitting: Family Medicine

## 2016-10-23 DIAGNOSIS — R413 Other amnesia: Secondary | ICD-10-CM | POA: Insufficient documentation

## 2016-10-23 MED ORDER — GADOBENATE DIMEGLUMINE 529 MG/ML IV SOLN
20.0000 mL | Freq: Once | INTRAVENOUS | Status: AC | PRN
  Administered 2016-10-23: 20 mL via INTRAVENOUS

## 2016-10-30 ENCOUNTER — Telehealth: Payer: Self-pay

## 2016-10-30 NOTE — Telephone Encounter (Signed)
[id number] 

## 2016-10-30 NOTE — Telephone Encounter (Signed)
-----   Message from Birdie Sons, MD sent at 10/30/2016  8:34 AM EDT ----- MRI is normal. Can try aricept 5mg  daily #30, rf x 2 for memory and follow up in 6-8 weeks.

## 2016-10-31 MED ORDER — DONEPEZIL HCL 5 MG PO TABS: 5.0000 mg | ORAL_TABLET | Freq: Every day | ORAL | 2 refills | Status: DC

## 2016-10-31 NOTE — Telephone Encounter (Signed)
Patient's wife [name] was notified of results. Expressed understanding. Rx sent to pharmacy.

## 2016-10-31 NOTE — Addendum Note (Signed)
Addended by: Julieta Bellini on: 10/31/2016 02:33 PM   Modules accepted: Orders

## 2016-12-24 ENCOUNTER — Ambulatory Visit (INDEPENDENT_AMBULATORY_CARE_PROVIDER_SITE_OTHER): Payer: Medicare Other | Admitting: Family Medicine

## 2016-12-24 ENCOUNTER — Encounter: Payer: Self-pay | Admitting: Family Medicine

## 2016-12-24 VITALS — BP 128/74 | HR 58 | Temp 98.6°F | Resp 16 | Wt 234.0 lb

## 2016-12-24 DIAGNOSIS — N529 Male erectile dysfunction, unspecified: Secondary | ICD-10-CM

## 2016-12-24 DIAGNOSIS — N50819 Testicular pain, unspecified: Secondary | ICD-10-CM | POA: Diagnosis not present

## 2016-12-24 DIAGNOSIS — C61 Malignant neoplasm of prostate: Secondary | ICD-10-CM | POA: Diagnosis not present

## 2016-12-24 NOTE — Progress Notes (Signed)
Patient: Luke [name] Male    DOB: 12/01/1946   70 y.o.   MRN: [id number] Visit Date: 12/24/2016  Today's Provider: Lelon Huh, MD   Chief Complaint  Patient presents with  . Groin Pain   Subjective:    HPI Patient presents complaining of long and progressive history of bilateral testicular pain and erectile dysfunctions. He had penile in 2008 which he reports had to be removed in 2014 because it had broken. Around that time he was diagnosed with prostate cancer and treated by Dr. Eliberto Ivory with I-125 brachytherapy in July 2015. He states he has had discomfort in testicular area ever since. He has been using penile pump for ED which he reports as being ineffective. He has also taken Viagra and generic sildenafil which he doesn't feel has been effective.   He was seen for left inguinal pain May 15th. Hip xrays revealed moderate degenerative changes with spurring in both hips and pain was thought to be referred from hips and was prescribed meloxicam. He is a very vague historian and can't really tess if this was a different pain or the same pain he has been having since removal of penil implant  He is here to day out of frustration and wants referral to another urologist 'to find out what's going on' in regards to chronic pain and ED.       Past Surgical History:  Procedure Laterality Date  . ABI  07/09/2011   Normal; Left= 1.21, Right=1.00. Triphasic waver forms  . Carotid Doppler Ultrasound  10/18/2012   Minimal soft plague both carotids. No hemodynamically significant stenosis  . CHOLECYSTECTOMY  2001  . Lumbar Spine X Ray  11/28/2011   Mild DDD L1- L2. Anterior heigt loss of T12-L1 vertebral bodies  . NEPHRECTOMY Right 2001   done by Dr. Eliberto Ivory for Angiomyolipoma  . PENILE PROSTHESIS  REMOVAL  11/28/2013   Dr. Eliberto Ivory  . PENILE PROSTHESIS IMPLANT  2008  . Photovaporization of prostate with green light laser  01/19/2012   Dr. Rogers Blocker  . [place]  . UPPER GI  ENDOSCOPY  07/10/2011   Done by Dr. Sharen Counter. Revealed Schatski rings   Past Medical History:  Diagnosis Date  . Acquired absence of kidney 12/29/1988  . Brachial neuritis 04/02/2009  . Claudication ([place]) 02/06/2016  . 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 Dysphagia 04/02/2009  . GERD (gastroesophageal reflux disease) 02/06/2016   Schatski ring seen on EGD 07-10-11   . History of MRSA infection 11/18/2007  . Hyperlipidemia, mixed 12/29/1998  . Hypertension 04/02/2009  . Hypogonadism male 02/06/2016  . Insomnia 02/06/2016  . LBP (low back pain) 07/13/2013  . Numbness of toes 02/06/2016  . Palpitations 02/06/2016  . Prediabetes 02/08/2016   A1c=6.2 02/08/16   . Prostate cancer ([place]) 2014   Seed Implants  . Single kidney   . Vitamin D deficiency 02/06/2016     No Known Allergies   Current Outpatient Prescriptions:  .  amLODipine (NORVASC) 10 MG tablet, TAKE 1 TABLET BY MOUTH DAILY, Disp: 90 tablet, Rfl: 4 .  Cholecalciferol (VITAMIN D) 2000 units CAPS, Take 1 capsule by mouth daily. Reported on 02/07/2016, Disp: , Rfl:  .  donepezil (ARICEPT) 5 MG tablet, Take 1 tablet (5 mg total) by mouth at bedtime., Disp: 30 tablet, Rfl: 2 .  losartan-hydrochlorothiazide (HYZAAR) 100-12.5 MG tablet, TAKE 1 TABLET BY MOUTH EVERY DAY, Disp: 90 tablet, Rfl: 4 .  sertraline (ZOLOFT) 50  MG tablet, Take 50 mg by mouth as needed. , Disp: , Rfl:  .  vitamin E 100 UNIT capsule, Take by mouth daily., Disp: , Rfl:  .  meloxicam (MOBIC) 15 MG tablet, Take 1 tablet (15 mg total) by mouth daily as needed for pain., Disp: 30 tablet, Rfl: 5  Review of Systems  Constitutional: Negative for activity change, appetite change, chills, diaphoresis, fatigue, fever and unexpected weight change.  Gastrointestinal: Negative for constipation, diarrhea and nausea.  Genitourinary: Positive for testicular pain. Negative for decreased urine volume, difficulty urinating, discharge, dysuria, frequency, hematuria, penile pain, penile  swelling, scrotal swelling and urgency.  Musculoskeletal: Negative for back pain.    Social History  Substance Use Topics  . Smoking status: Never Smoker  . Smokeless tobacco: Never Used  . Alcohol use 0.0 oz/week     Comment: occasional use   Objective:   BP 128/74 (BP Location: Right Arm, Patient Position: Sitting, Cuff Size: Normal)   Pulse (!) 58   Temp 98.6 F (37 C)   Resp 16   Wt 234 lb (106.1 kg)   BMI 31.30 kg/m   Physical Exam  General appearance: alert, well developed, well nourished, cooperative and in no distress Head: Normocephalic, without obvious abnormality, atraumatic Respiratory: Respirations even and unlabored, normal respiratory rate Extremities: No gross deformities Skin: Skin color, texture, turgor normal. No rashes seen  Psych: Appropriate mood and affect. Neurologic: Mental status: Alert, oriented to person, place, and time, thought content appropriate.     Assessment & Plan:     1. Testicular pain  - Ambulatory referral to Urology  2. Prostate cancer ([place])   3. Erectile dysfunction, unspecified erectile dysfunction type  - Ambulatory referral to Urology  Patient expresses a great deal of frustration that local urologist have not been able help with testicular pain and ED and requests referral to another urologist as above. Will try to get into [facility] for further evaluation.       Lelon Huh, MD  [facility]

## 2017-01-01 ENCOUNTER — Telehealth: Payer: Self-pay | Admitting: Family Medicine

## 2017-01-01 NOTE — Telephone Encounter (Signed)
Called Pt to schedule [facility] with [facility] 2/14 @ 1pm- knb

## 2017-02-09 DIAGNOSIS — N529 Male erectile dysfunction, unspecified: Secondary | ICD-10-CM | POA: Diagnosis not present

## 2017-02-09 DIAGNOSIS — N50819 Testicular pain, unspecified: Secondary | ICD-10-CM | POA: Diagnosis not present

## 2017-02-10 ENCOUNTER — Other Ambulatory Visit: Payer: Medicare Other

## 2017-02-12 ENCOUNTER — Ambulatory Visit: Payer: Medicare Other

## 2017-02-13 ENCOUNTER — Ambulatory Visit: Payer: Medicare Other | Admitting: Urology

## 2017-02-16 DIAGNOSIS — N529 Male erectile dysfunction, unspecified: Secondary | ICD-10-CM | POA: Diagnosis not present

## 2017-04-08 ENCOUNTER — Telehealth: Payer: Self-pay | Admitting: Family Medicine

## 2017-04-08 NOTE — Telephone Encounter (Signed)
Called Pt to schedule [facility] with [facility] - knb °

## 2017-05-12 ENCOUNTER — Ambulatory Visit (INDEPENDENT_AMBULATORY_CARE_PROVIDER_SITE_OTHER): Payer: Medicare Other

## 2017-05-12 ENCOUNTER — Ambulatory Visit (INDEPENDENT_AMBULATORY_CARE_PROVIDER_SITE_OTHER): Payer: Medicare Other | Admitting: Family Medicine

## 2017-05-12 VITALS — BP 138/82 | HR 58 | Temp 98.0°F | Resp 14 | Ht 73.0 in | Wt 228.8 lb

## 2017-05-12 DIAGNOSIS — H9193 Unspecified hearing loss, bilateral: Secondary | ICD-10-CM

## 2017-05-12 DIAGNOSIS — R413 Other amnesia: Secondary | ICD-10-CM | POA: Diagnosis not present

## 2017-05-12 DIAGNOSIS — E782 Mixed hyperlipidemia: Secondary | ICD-10-CM | POA: Diagnosis not present

## 2017-05-12 DIAGNOSIS — R200 Anesthesia of skin: Secondary | ICD-10-CM | POA: Diagnosis not present

## 2017-05-12 DIAGNOSIS — R7303 Prediabetes: Secondary | ICD-10-CM

## 2017-05-12 DIAGNOSIS — I10 Essential (primary) hypertension: Secondary | ICD-10-CM

## 2017-05-12 DIAGNOSIS — E559 Vitamin D deficiency, unspecified: Secondary | ICD-10-CM | POA: Diagnosis not present

## 2017-05-12 DIAGNOSIS — Z23 Encounter for immunization: Secondary | ICD-10-CM | POA: Diagnosis not present

## 2017-05-12 DIAGNOSIS — Z0000 Encounter for general adult medical examination without abnormal findings: Secondary | ICD-10-CM | POA: Diagnosis not present

## 2017-05-12 DIAGNOSIS — H9190 Unspecified hearing loss, unspecified ear: Secondary | ICD-10-CM | POA: Insufficient documentation

## 2017-05-12 NOTE — Progress Notes (Signed)
Subjective:   Luke [name] is a 71 y.o. male who presents for Medicare Annual/Subsequent preventive examination.  Review of Systems:   Cardiac Risk Factors include: advanced age (>58men, >60 women);obesity (BMI >30kg/m2);hypertension;diabetes mellitus     Objective:    Vitals: BP 138/82 (BP Location: Right Arm, Patient Position: Sitting)   Pulse (!) 58   Temp 98 F (36.7 C)   Resp 14   Ht 6\' 1"  (1.854 m)   Wt 228 lb 12.8 oz (103.8 kg)   BMI 30.19 kg/m   Body mass index is 30.19 kg/m.  Tobacco History  Smoking Status  . Never Smoker  Smokeless Tobacco  . Never Used    Comment: smokes a Lexicographer with friends     Counseling given: No   Past Medical History:  Diagnosis Date  . Acquired absence of kidney 12/29/1988  . Brachial neuritis 04/02/2009  . Claudication ([place]) 02/06/2016  . 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 Dysphagia 04/02/2009  . GERD (gastroesophageal reflux disease) 02/06/2016   Schatski ring seen on EGD 07-10-11   . History of MRSA infection 11/18/2007  . Hyperlipidemia, mixed 12/29/1998  . Hypertension 04/02/2009  . Hypogonadism male 02/06/2016  . Insomnia 02/06/2016  . LBP (low back pain) 07/13/2013  . Numbness of toes 02/06/2016  . Palpitations 02/06/2016  . Prediabetes 02/08/2016   A1c=6.2 02/08/16   . Prostate cancer ([place]) 2014   Seed Implants  . Single kidney   . Vitamin D deficiency 02/06/2016   Past Surgical History:  Procedure Laterality Date  . ABI  07/09/2011   Normal; Left= 1.21, Right=1.00. Triphasic waver forms  . Carotid Doppler Ultrasound  10/18/2012   Minimal soft plague both carotids. No hemodynamically significant stenosis  . CHOLECYSTECTOMY  2001  . Lumbar Spine X Ray  11/28/2011   Mild DDD L1- L2. Anterior heigt loss of T12-L1 vertebral bodies  . NEPHRECTOMY Right 2001   done by Dr. Eliberto Ivory for Angiomyolipoma  . PENILE PROSTHESIS  REMOVAL  11/28/2013   Dr. Eliberto Ivory  . PENILE PROSTHESIS IMPLANT  2008  .  Photovaporization of prostate with green light laser  01/19/2012   Dr. Rogers Blocker  . [place]  . UPPER GI ENDOSCOPY  07/10/2011   Done by Dr. Sharen Counter. Revealed Schatski rings   Family History  Problem Relation Age of Onset  . Heart attack Mother   . Hypertension Father   . Prostate cancer Neg Hx   . Bladder Cancer Neg Hx   . Kidney cancer Neg Hx    History  Sexual Activity  . Sexual activity: Not on file    Outpatient Encounter Prescriptions as of 05/12/2017  Medication Sig  . amLODipine (NORVASC) 10 MG tablet TAKE 1 TABLET BY MOUTH DAILY  . donepezil (ARICEPT) 5 MG tablet Take 1 tablet (5 mg total) by mouth at bedtime.  Marland Kitchen losartan-hydrochlorothiazide (HYZAAR) 100-12.5 MG tablet TAKE 1 TABLET BY MOUTH EVERY DAY  . meloxicam (MOBIC) 15 MG tablet Take 1 tablet (15 mg total) by mouth daily as needed for pain.  . Cholecalciferol (VITAMIN D) 2000 units CAPS Take 1 capsule by mouth daily. Reported on 02/07/2016  . sertraline (ZOLOFT) 50 MG tablet Take 50 mg by mouth as needed.   . vitamin E 100 UNIT capsule Take by mouth daily.   No facility-administered encounter medications on file as of 05/12/2017.     Activities of Daily Living In your present state of health, do you have any difficulty  performing the following activities: 05/12/2017  Hearing? Y  Vision? Y  Difficulty concentrating or making decisions? Y  Walking or climbing stairs? N  Dressing or bathing? N  Doing errands, shopping? N  Preparing Food and eating ? N  Using the Toilet? N  In the past six months, have you accidently leaked urine? N  Do you have problems with loss of bowel control? N  Managing your Medications? N  Managing your Finances? N  Housekeeping or managing your Housekeeping? N  Some recent data might be hidden    Patient Care Team: Birdie Sons, MD as PCP - General (Family Medicine) Royston Cowper, MD (Urology)   Assessment:     Exercise Activities and Dietary  recommendations Current Exercise Habits: Home exercise routine, Time (Minutes): 45, Frequency (Times/Week): 3, Weekly Exercise (Minutes/Week): 135, Intensity: Mild  Goals    . Increase water intake          Recommend increasing water intake to 2-3 glasses a day.      Fall Risk Fall Risk  05/12/2017 05/12/2017 02/07/2016  Falls in the past year? No No No   Depression Screen PHQ 2/9 Scores 05/12/2017 05/12/2017 02/07/2016  PHQ - 2 Score 1 1 0  PHQ- 9 Score 9 - -    Cognitive Function     6CIT Screen 05/12/2017  What Year? 4 points  What month? 3 points  What time? 0 points  Count back from 20 0 points  Months in reverse 4 points  Repeat phrase 10 points  Total Score 21    Immunization History  Administered Date(s) Administered  . Influenza, High Dose Seasonal PF 11/30/2015, 09/09/2016  . Influenza-Unspecified 11/28/2013  . Pneumococcal Conjugate-13 02/07/2016  . Pneumococcal Polysaccharide-23 04/30/2010  . Tdap 04/30/2010   Screening Tests Health Maintenance  Topic Date Due  . FOOT EXAM  04/02/1956  . PNA vac Low Risk Adult (2 of 2 - PPSV23) 02/06/2017  . HEMOGLOBIN A1C  03/09/2017  . OPHTHALMOLOGY EXAM  11/28/2017 (Originally 04/02/1956)  . INFLUENZA VACCINE  07/29/2017  . TETANUS/TDAP  04/30/2020  . COLONOSCOPY  04/07/2024  . Hepatitis C Screening  Completed      Plan:    I have personally reviewed and addressed the Medicare Annual Wellness questionnaire and have noted the following in the patient's chart:  A. Medical and social history B. Use of alcohol, tobacco or illicit drugs  C. Current medications and supplements D. Functional ability and status E.  Nutritional status F.  Physical activity G. Advance directives H. List of other physicians I.  Hospitalizations, surgeries, and ER visits in previous 12 months J.  [place] such as hearing and vision if needed, cognitive and depression L. Referrals and appointments - none  In addition, I have  reviewed and discussed with patient certain preventive protocols, quality metrics, and best practice recommendations. A written personalized care plan for preventive services as well as general preventive health recommendations were provided to patient.  See attached scanned questionnaire for additional information.   Signed,  Tyler Aas, LPN Nurse Health Advisor   MD Recommendations: Patient needs diabetic foot exam and Hemoglobin A1c at todays visit. Pt will check with his wife to see if he has had a P23 since 2017. Patient failed 6CIT exam today.

## 2017-05-12 NOTE — Progress Notes (Signed)
Patient: Luke [name] Male    DOB: [birth year]   71 y.o.   MRN: [id number] Visit Date: 05/12/2017  Today's Provider: Lelon Huh, MD   No chief complaint on file.  Subjective:    HPI   Diabetes Mellitus Type II, Follow-up:   Lab Results  Component Value Date   HGBA1C 5.9 09/09/2016   HGBA1C 6.2 (H) 02/07/2016   HGBA1C 6.0 02/20/2015   Last seen for diabetes 8 months ago.  Management since then includes; labs checked, no changes. He reports good compliance with treatment. He is not having side effects. none Current symptoms include none and have been unchanged. Home blood sugar records: fasting range: not checking   Episodes of hypoglycemia? no   Current Insulin Regimen: n/a Most Recent Eye Exam: due Weight trend: stable Prior visit with dietician: no Current diet: well balanced Current exercise: bicycling  ----------------------------------------------------------------    Hypertension, follow-up:  BP Readings from Last 3 Encounters:  05/12/17 138/82  12/24/16 128/74  09/09/16 126/90    He was last seen for hypertension 8 months ago.  BP at that visit was 126-90. Management since that visit includes; no changes, continue current medications.He reports good compliance with treatment. He is not having side effects. none He is exercising. He is adherent to low salt diet.   Outside blood pressures are not checking . He is experiencing none.  Patient denies none.   Cardiovascular risk factors include diabetes mellitus.  Use of agents associated with hypertension: none.   ----------------------------------------------------------------    Lipid/Cholesterol, Follow-up:   Last seen for this 02/07/2016.  Management since that visit includes; labs checked, no changes.  Last Lipid Panel:    Component Value Date/Time   CHOL 165 02/07/2016 1120   TRIG 140 02/07/2016 1120   HDL 45 02/07/2016 1120   CHOLHDL 3.7 02/07/2016 1120   LDLCALC 92  02/07/2016 1120    He reports good compliance with treatment. He is not having side effects. none  Wt Readings from Last 3 Encounters:  05/12/17 228 lb 12.8 oz (103.8 kg)  12/24/16 234 lb (106.1 kg)  09/09/16 236 lb (107 kg)    ----------------------------------------------------------------  Dementia, without behavioral disturbance From 09/09/2016-labs finding only low vitamin d. MRI was unremarkable. Prescribed Aricept 5 mg qd which he stopped after short period of time because he states it made me feel weird.  Vitamin D deficiency From 09/09/2016-labs checked, started otc vitamin D3 2,000 units qd. Lab Results  Component Value Date   VD25OH 27.2 (L) 09/09/2016    Gastroesophageal reflux disease without esophagitis From 02/07/2016-no changes. Continue current medications.  He also complains of numbness in his toes and one of his thumbs for the last few months. No burning or stinging.   No Known Allergies   Current Outpatient Prescriptions:  .  amLODipine (NORVASC) 10 MG tablet, TAKE 1 TABLET BY MOUTH DAILY, Disp: 90 tablet, Rfl: 4 .  Cholecalciferol (VITAMIN D) 2000 units CAPS, Take 1 capsule by mouth daily. Reported on 02/07/2016, Disp: , Rfl:  .  donepezil (ARICEPT) 5 MG tablet, Take 1 tablet (5 mg total) by mouth at bedtime., Disp: 30 tablet, Rfl: 2 .  losartan-hydrochlorothiazide (HYZAAR) 100-12.5 MG tablet, TAKE 1 TABLET BY MOUTH EVERY DAY, Disp: 90 tablet, Rfl: 4 .  meloxicam (MOBIC) 15 MG tablet, Take 1 tablet (15 mg total) by mouth daily as needed for pain., Disp: 30 tablet, Rfl: 5 .  sertraline (ZOLOFT) 50 MG tablet, Take 50 mg  by mouth as needed. , Disp: , Rfl:  .  vitamin E 100 UNIT capsule, Take by mouth daily., Disp: , Rfl:   Review of Systems  Constitutional: Negative for appetite change, chills and fever.  HENT: Positive for hearing loss and tinnitus.   Respiratory: Negative for chest tightness, shortness of breath and wheezing.   Cardiovascular: Positive for  chest pain. Negative for palpitations.  Gastrointestinal: Negative for abdominal pain, nausea and vomiting.  Genitourinary: Positive for penile pain.  Musculoskeletal: Positive for back pain.  Psychiatric/Behavioral: Positive for confusion and decreased concentration.    Social History  Substance Use Topics  . Smoking status: Never Smoker  . Smokeless tobacco: Never Used     Comment: smokes a Lexicographer with friends  . Alcohol use 1.2 oz/week    2 Cans of beer per week     Comment: occasional use   Objective:   BP  138/82 (BP Location: Right Arm, Patient Position: Sitting)     Pulse    58     Temp  98 F (36.7 C)     Resp  14     Ht  6\' 1"  (1.854 m)      Wt  228 lb 12.8 oz (103.8 kg)   BMI  30.19 kg/m      Vitals History  BMI and BSA Data   Body Mass Index: 30.19 kg/m Body Surface Area: 2.31 m     Physical Exam   General Appearance:    Alert, cooperative, no distress  Eyes:    PERRL, conjunctiva/corneas clear, EOM's intact       Lungs:     Clear to auscultation bilaterally, respirations unlabored  Heart:    Regular rate and rhythm  Neurologic:   Awake, alert, oriented to person and place. No apparent focal neurological           defect.           Assessment & Plan:     1. Hyperlipidemia, mixed  - Comprehensive metabolic panel - Lipid panel  2. Essential hypertension Well controlled.  Continue current medications.    3. Vitamin D deficiency Is taking vitamin d supplement sporadically.  - VITAMIN D 25 Hydroxy (Vit-D Deficiency, Fractures)  4. Prediabetes  - Hemoglobin A1c  5. Need for pneumococcal vaccination  - Pneumococcal polysaccharide vaccine 23-valent greater than or equal to 2yo subcutaneous/IM  6. Hearing difficulty of both ears Care coordinator is working on getting assistance for hearing aid.   7. Numbness of toes  - Vitamin B12  8. Memory impairment He did poorly on 6-CIT today scoring 25. He knows  Month, but unable to give answer when asked year, day , or date. He did not like the way donepezil made him feel and stopped taking. I recommended further neurology evaluation.  - Ambulatory referral to Neurology       Lelon Huh, MD  [facility]

## 2017-05-12 NOTE — Patient Instructions (Addendum)
Luke [name] , Thank you for taking time to come for your Medicare Wellness Visit. I appreciate your ongoing commitment to your health goals. Please review the following plan we discussed and let me know if I can assist you in the future.   Screening recommendations/referrals: Colonoscopy: Completed 04/07/2014, due 04/05/2024 Recommended yearly ophthalmology/optometry visit for glaucoma screening and checkup Recommended yearly dental visit for hygiene and checkup  Vaccinations: Influenza vaccine: Completed 08/2016, due 08/2017 Pneumococcal vaccine: Prevnar 23 due, check to see if you have received this after 05/2016 Tdap vaccine: completed 04/2010, due 04/2020 Shingles vaccine: Due, check with your insurance company for coverage.   Advanced directives: Please bring a copy of your advanced directives at your convenience.   Conditions/risks identified: Recommend increasing water intake to 2-3 glasses a day.  Next appointment: none, follow up in one year for your annual wellness exam.  Preventive Care 65 Years and Older, Male Preventive care refers to lifestyle choices and visits with your health care provider that can promote health and wellness. What does preventive care include?  A yearly physical exam. This is also called an annual well check.  Dental exams once or twice a year.  Routine eye exams. Ask your health care provider how often you should have your eyes checked.  Personal lifestyle choices, including:  Daily care of your teeth and gums.  Regular physical activity.  Eating a healthy diet.  Avoiding tobacco and drug use.  Limiting alcohol use.  Practicing safe sex.  Taking low doses of aspirin every day.  Taking vitamin and mineral supplements as recommended by your health care provider. What happens during an annual well check? The services and screenings done by your health care provider during your annual well check will depend on your age, overall health, lifestyle  risk factors, and family history of disease. Counseling  Your health care provider may ask you questions about your:  Alcohol use.  Tobacco use.  Drug use.  Emotional well-being.  Home and relationship well-being.  Sexual activity.  Eating habits.  History of falls.  Memory and ability to understand (cognition).  Work and work Statistician. Screening  You may have the following tests or measurements:  Height, weight, and BMI.  Blood pressure.  Lipid and cholesterol levels. These may be checked every 5 years, or more frequently if you are over 51 years old.  Skin check.  Lung cancer screening. You may have this screening every year starting at age 5 if you have a 30-pack-year history of smoking and currently smoke or have quit within the past 15 years.  Fecal occult blood test (FOBT) of the stool. You may have this test every year starting at age 71.  Flexible sigmoidoscopy or colonoscopy. You may have a sigmoidoscopy every 5 years or a colonoscopy every 10 years starting at age 31.  Prostate cancer screening. Recommendations will vary depending on your family history and other risks.  Hepatitis C blood test.  Hepatitis B blood test.  Sexually transmitted disease (STD) testing.  Diabetes screening. This is done by checking your blood sugar (glucose) after you have not eaten for a while (fasting). You may have this done every 1-3 years.  Abdominal aortic aneurysm (AAA) screening. You may need this if you are a current or former smoker.  Osteoporosis. You may be screened starting at age 38 if you are at high risk. Talk with your health care provider about your test results, treatment options, and if necessary, the need for more tests.  Vaccines  Your health care provider may recommend certain vaccines, such as:  Influenza vaccine. This is recommended every year.  Tetanus, diphtheria, and acellular pertussis (Tdap, Td) vaccine. You may need a Td booster every 10  years.  Zoster vaccine. You may need this after age 11.  Pneumococcal 13-valent conjugate (PCV13) vaccine. One dose is recommended after age 54.  Pneumococcal polysaccharide (PPSV23) vaccine. One dose is recommended after age 33. Talk to your health care provider about which screenings and vaccines you need and how often you need them. This information is not intended to replace advice given to you by your health care provider. Make sure you discuss any questions you have with your health care provider. Document Released: 01/11/2016 Document Revised: 09/03/2016 Document Reviewed: 10/16/2015 Elsevier Interactive Patient Education  2017 [employer] Prevention in the Home Falls can cause injuries. They can happen to people of all ages. There are many things you can do to make your home safe and to help prevent falls. What can I do on the outside of my home?  Regularly fix the edges of walkways and driveways and fix any cracks.  Remove anything that might make you trip as you walk through a door, such as a raised step or threshold.  Trim any bushes or trees on the path to your home.  Use bright outdoor lighting.  Clear any walking paths of anything that might make someone trip, such as rocks or tools.  Regularly check to see if handrails are loose or broken. Make sure that both sides of any steps have handrails.  Any raised decks and porches should have guardrails on the edges.  Have any leaves, snow, or ice cleared regularly.  Use sand or salt on walking paths during winter.  Clean up any spills in your garage right away. This includes oil or grease spills. What can I do in the bathroom?  Use night lights.  Install grab bars by the toilet and in the tub and shower. Do not use towel bars as grab bars.  Use non-skid mats or decals in the tub or shower.  If you need to sit down in the shower, use a plastic, non-slip stool.  Keep the floor dry. Clean up any water that  spills on the floor as soon as it happens.  Remove soap buildup in the tub or shower regularly.  Attach bath mats securely with double-sided non-slip rug tape.  Do not have throw rugs and other things on the floor that can make you trip. What can I do in the bedroom?  Use night lights.  Make sure that you have a light by your bed that is easy to reach.  Do not use any sheets or blankets that are too big for your bed. They should not hang down onto the floor.  Have a firm chair that has side arms. You can use this for support while you get dressed.  Do not have throw rugs and other things on the floor that can make you trip. What can I do in the kitchen?  Clean up any spills right away.  Avoid walking on wet floors.  Keep items that you use a lot in easy-to-reach places.  If you need to reach something above you, use a strong step stool that has a grab bar.  Keep electrical cords out of the way.  Do not use floor polish or wax that makes floors slippery. If you must use wax, use non-skid floor wax.  Do not have throw rugs and other things on the floor that can make you trip. What can I do with my stairs?  Do not leave any items on the stairs.  Make sure that there are handrails on both sides of the stairs and use them. Fix handrails that are broken or loose. Make sure that handrails are as long as the stairways.  Check any carpeting to make sure that it is firmly attached to the stairs. Fix any carpet that is loose or worn.  Avoid having throw rugs at the top or bottom of the stairs. If you do have throw rugs, attach them to the floor with carpet tape.  Make sure that you have a light switch at the top of the stairs and the bottom of the stairs. If you do not have them, ask someone to add them for you. What else can I do to help prevent falls?  Wear shoes that:  Do not have high heels.  Have rubber bottoms.  Are comfortable and fit you well.  Are closed at the  toe. Do not wear sandals.  If you use a stepladder:  Make sure that it is fully opened. Do not climb a closed stepladder.  Make sure that both sides of the stepladder are locked into place.  Ask someone to hold it for you, if possible.  Clearly mark and make sure that you can see:  Any grab bars or handrails.  First and last steps.  Where the edge of each step is.  Use tools that help you move around (mobility aids) if they are needed. These include:  Canes.  Walkers.  Scooters.  Crutches.  Turn on the lights when you go into a dark area. Replace any light bulbs as soon as they burn out.  Set up your furniture so you have a clear path. Avoid moving your furniture around.  If any of your floors are uneven, fix them.  If there are any pets around you, be aware of where they are.  Review your medicines with your doctor. Some medicines can make you feel dizzy. This can increase your chance of falling. Ask your doctor what other things that you can do to help prevent falls. This information is not intended to replace advice given to you by your health care provider. Make sure you discuss any questions you have with your health care provider. Document Released: 10/11/2009 Document Revised: 05/22/2016 Document Reviewed: 01/19/2015 Elsevier Interactive Patient Education  2017 [employer].

## 2017-05-13 LAB — COMPREHENSIVE METABOLIC PANEL
ALBUMIN: 4.4 g/dL (ref 3.5–4.8)
ALK PHOS: 86 IU/L (ref 39–117)
ALT: 10 IU/L (ref 0–44)
AST: 14 IU/L (ref 0–40)
Albumin/Globulin Ratio: 1.6 (ref 1.2–2.2)
BILIRUBIN TOTAL: 0.5 mg/dL (ref 0.0–1.2)
BUN / CREAT RATIO: 10 (ref 10–24)
BUN: 13 mg/dL (ref 8–27)
CHLORIDE: 101 mmol/L (ref 96–106)
CO2: 25 mmol/L (ref 18–29)
Calcium: 9.8 mg/dL (ref 8.6–10.2)
Creatinine, Ser: 1.35 mg/dL — ABNORMAL HIGH (ref 0.76–1.27)
GFR calc Af Amer: 61 mL/min/{1.73_m2} (ref >59)
GFR calc non Af Amer: 53 mL/min/{1.73_m2} — ABNORMAL LOW (ref >59)
GLOBULIN, TOTAL: 2.7 g/dL (ref 1.5–4.5)
GLUCOSE: 107 mg/dL — AB (ref 65–99)
Potassium: 4.2 mmol/L (ref 3.5–5.2)
SODIUM: 143 mmol/L (ref 134–144)
Total Protein: 7.1 g/dL (ref 6.0–8.5)

## 2017-05-13 LAB — LIPID PANEL
CHOLESTEROL TOTAL: 177 mg/dL (ref 100–199)
Chol/HDL Ratio: 4.4 ratio (ref 0.0–5.0)
HDL: 40 mg/dL (ref >39)
LDL Calculated: 75 mg/dL (ref 0–99)
TRIGLYCERIDES: 308 mg/dL — AB (ref 0–149)
VLDL Cholesterol Cal: 62 mg/dL — ABNORMAL HIGH (ref 5–40)

## 2017-05-13 LAB — HEMOGLOBIN A1C
ESTIMATED AVERAGE GLUCOSE: 128 mg/dL
Hgb A1c MFr Bld: 6.1 % — ABNORMAL HIGH (ref 4.8–5.6)

## 2017-05-13 LAB — VITAMIN D 25 HYDROXY (VIT D DEFICIENCY, FRACTURES): VIT D 25 HYDROXY: 22.1 ng/mL — AB (ref 30.0–100.0)

## 2017-05-13 LAB — VITAMIN B12: VITAMIN B 12: 613 pg/mL (ref 232–1245)

## 2017-05-27 ENCOUNTER — Telehealth: Payer: Self-pay | Admitting: Family Medicine

## 2017-05-27 NOTE — Telephone Encounter (Signed)
Lm for pt to return my call cb # [phone] to discuss options for Hearing Aid Assistance - knb

## 2017-05-28 ENCOUNTER — Encounter: Payer: Self-pay | Admitting: Family Medicine

## 2017-08-28 ENCOUNTER — Ambulatory Visit: Payer: Medicare Other | Admitting: Family Medicine

## 2017-09-01 ENCOUNTER — Ambulatory Visit (INDEPENDENT_AMBULATORY_CARE_PROVIDER_SITE_OTHER): Payer: Medicare Other | Admitting: Family Medicine

## 2017-09-01 ENCOUNTER — Encounter: Payer: Self-pay | Admitting: Family Medicine

## 2017-09-01 ENCOUNTER — Other Ambulatory Visit: Payer: Self-pay | Admitting: Family Medicine

## 2017-09-01 VITALS — BP 148/102 | HR 57 | Temp 98.9°F | Resp 16 | Wt 222.0 lb

## 2017-09-01 DIAGNOSIS — R7303 Prediabetes: Secondary | ICD-10-CM

## 2017-09-01 DIAGNOSIS — M545 Low back pain, unspecified: Secondary | ICD-10-CM

## 2017-09-01 DIAGNOSIS — E559 Vitamin D deficiency, unspecified: Secondary | ICD-10-CM

## 2017-09-01 DIAGNOSIS — Z23 Encounter for immunization: Secondary | ICD-10-CM

## 2017-09-01 DIAGNOSIS — R413 Other amnesia: Secondary | ICD-10-CM | POA: Diagnosis not present

## 2017-09-01 MED ORDER — DICLOFENAC POTASSIUM 50 MG PO TABS: 50.0000 mg | ORAL_TABLET | Freq: Three times a day (TID) | ORAL | 3 refills | Status: DC | PRN

## 2017-09-01 NOTE — Progress Notes (Signed)
Patient: Luke [name] Male    DOB: [birth year]   71 y.o.   MRN: [id number] Visit Date: 09/01/2017  Today's Provider: Lelon Huh, MD   Chief Complaint  Patient presents with  . Follow-up  . Diabetes   Subjective:    HPI   States he has been haivng low back pain across center of back for the last two weeks after working for [employer]. Taken some OTC tylenol. No radiation. Hurts wose when walking and standing up. Feels a little weak in legs when he first stands.      Hypertension, follow-up:  BP Readings from Last 3 Encounters:  05/12/17 138/82  12/24/16 128/74  09/09/16 126/90    He was last seen for hypertension 4 months ago.  BP at that visit was . Management since that visit includes; no changes.He reports fair compliance with treatment. He is not having side effects.  He is not exercising. He is adherent to low salt diet.   Outside blood pressures are not being checked. He is experiencing none.  Patient denies chest pain, chest pressure/discomfort, claudication, dyspnea, exertional chest pressure/discomfort, fatigue, irregular heart beat, lower extremity edema, near-syncope, orthopnea, palpitations, paroxysmal nocturnal dyspnea, syncope and tachypnea.   Cardiovascular risk factors include advanced age (older than 5 for men, 26 for women), hypertension and male gender.  Use of agents associated with hypertension: none.   ----------------------------------------------------------------.    Lipid/Cholesterol, Follow-up:   Last seen for this 4 months ago.  Management since that visit includes; labs checked, no changes.  Last Lipid Panel:    Component Value Date/Time   CHOL 177 05/12/2017 1547   TRIG 308 (H) 05/12/2017 1547   HDL 40 05/12/2017 1547   CHOLHDL 4.4 05/12/2017 1547   LDLCALC 75 05/12/2017 1547    He reports fair compliance with treatment. He is not having side effects.   Wt Readings from Last 3 Encounters:  05/12/17 228 lb  12.8 oz (103.8 kg)  12/24/16 234 lb (106.1 kg)  09/09/16 236 lb (107 kg)    ----------------------------------------------------------------.  Vitamin D deficiency From 05/12/2017-start vitamin D3 qd. sporadically. Patient comes in today reporting that he does not take Vitamin D supplements any more. Lab Results  Component Value Date   VD25OH 22.1 (L) 05/12/2017     Prediabetes From 05/12/2017-Hemoglobin A1c was 6.1. No changes.   He also continues to have poor memory. Did not respond well to aricept 6CIT Screen 05/12/2017  What Year? 4 points  What month? 3 points  What time? 0 points  Count back from 20 0 points  Months in reverse 4 points  Repeat phrase 10 points  Total Score 21   Was referred to neurology after last visit in May, but he did not follow through with referral. He would like to go ahead and pursue additional neurology work up.   No Known Allergies   Current Outpatient Prescriptions:  .  amLODipine (NORVASC) 10 MG tablet, TAKE 1 TABLET BY MOUTH DAILY, Disp: 90 tablet, Rfl: 4 .  losartan-hydrochlorothiazide (HYZAAR) 100-12.5 MG tablet, TAKE 1 TABLET BY MOUTH EVERY DAY, Disp: 90 tablet, Rfl: 4 .  sertraline (ZOLOFT) 50 MG tablet, Take 50 mg by mouth as needed. , Disp: , Rfl:  .  Cholecalciferol (VITAMIN D) 2000 units CAPS, Take 1 capsule by mouth daily. Reported on 02/07/2016, Disp: , Rfl:   Review of Systems  Constitutional: Negative for appetite change, chills and fever.  Respiratory: Negative for chest tightness, shortness  of breath and wheezing.   Cardiovascular: Negative for chest pain and palpitations.  Gastrointestinal: Negative for abdominal pain, nausea and vomiting.  Musculoskeletal: Positive for back pain.    Social History  Substance Use Topics  . Smoking status: Never Smoker  . Smokeless tobacco: Never Used     Comment: smokes a Lexicographer with friends  . Alcohol use 1.2 oz/week    2 Cans of beer per week     Comment:  occasional use   Objective:   BP (!) 148/102 (BP Location: Right Arm, Cuff Size: Large)   Pulse (!) 57   Temp 98.9 F (37.2 C) (Oral)   Resp 16   Wt 222 lb (100.7 kg)   SpO2 99% Comment: room air  BMI 29.29 kg/m  Vitals:   09/01/17 1637 09/01/17 1644  BP: (!) 144/100 (!) 148/102  Pulse: (!) 57   Resp: 16   Temp: 98.9 F (37.2 C)   TempSrc: Oral   SpO2: 99%   Weight: 222 lb (100.7 kg)      Physical Exam   General Appearance:    Alert, cooperative, no distress  Eyes:    PERRL, conjunctiva/corneas clear, EOM's intact       Lungs:     Clear to auscultation bilaterally, respirations unlabored  Heart:    Regular rate and rhythm  Neurologic:   Awake, alert, oriented x 2. No apparent focal neurological           defect.   MS:   Tender lumbar spine at level or old surgical scar. No muscle tenderness or spasm. No gross deformities.        Assessment & Plan:     1. Acute midline low back pain without sciatica  - diclofenac (CATAFLAM) 50 MG tablet; Take 1 tablet (50 mg total) by mouth 3 (three) times daily as needed.  Dispense: 60 tablet; Refill: 3  2. Prediabetes  - Hemoglobin A1c  3. Vitamin D deficiency  - VITAMIN D 25 Hydroxy (Vit-D Deficiency, Fractures)  4. Memory impairment  - Ambulatory referral to Neurology  5. Need for influenza vaccination  - Flu vaccine HIGH DOSE PF (Fluzone High dose)       Lelon Huh, MD  [facility]

## 2017-09-02 DIAGNOSIS — E559 Vitamin D deficiency, unspecified: Secondary | ICD-10-CM | POA: Diagnosis not present

## 2017-09-02 DIAGNOSIS — R7303 Prediabetes: Secondary | ICD-10-CM | POA: Diagnosis not present

## 2017-09-03 LAB — VITAMIN D 25 HYDROXY (VIT D DEFICIENCY, FRACTURES): VIT D 25 HYDROXY: 21.7 ng/mL — AB (ref 30.0–100.0)

## 2017-09-03 LAB — HEMOGLOBIN A1C
ESTIMATED AVERAGE GLUCOSE: 120 mg/dL
Hgb A1c MFr Bld: 5.8 % — ABNORMAL HIGH (ref 4.8–5.6)

## 2017-09-10 ENCOUNTER — Telehealth: Payer: Self-pay

## 2017-09-10 NOTE — Telephone Encounter (Signed)
Luke [name], Pt's wife advised.  (She is on the DPR)  Thanks,   -Mickel Baas

## 2017-09-10 NOTE — Telephone Encounter (Signed)
-----   Message from Birdie Sons, MD sent at 09/03/2017 12:53 PM EDT ----- Sugar is stable, averaging 120. Vitamin d level is very low. This could cause contribute to problems with memory. Needs to take OTC vitamin d3 2000 units every day. Follow up 6 months.

## 2017-09-15 ENCOUNTER — Encounter: Payer: Self-pay | Admitting: Family Medicine

## 2017-09-15 ENCOUNTER — Ambulatory Visit (INDEPENDENT_AMBULATORY_CARE_PROVIDER_SITE_OTHER): Payer: Medicare Other | Admitting: Family Medicine

## 2017-09-15 VITALS — BP 138/98 | HR 62 | Temp 97.8°F | Resp 16 | Wt 218.0 lb

## 2017-09-15 DIAGNOSIS — N451 Epididymitis: Secondary | ICD-10-CM | POA: Diagnosis not present

## 2017-09-15 DIAGNOSIS — C61 Malignant neoplasm of prostate: Secondary | ICD-10-CM

## 2017-09-15 DIAGNOSIS — R1030 Lower abdominal pain, unspecified: Secondary | ICD-10-CM | POA: Diagnosis not present

## 2017-09-15 LAB — POCT URINALYSIS DIPSTICK
Blood, UA: NEGATIVE
GLUCOSE UA: NEGATIVE
Ketones, UA: NEGATIVE
LEUKOCYTES UA: NEGATIVE
Nitrite, UA: NEGATIVE
PROTEIN UA: NEGATIVE
Spec Grav, UA: >=1.03 — AB (ref 1.010–1.025)
UROBILINOGEN UA: 0.2 U/dL
pH, UA: 6 (ref 5.0–8.0)

## 2017-09-15 LAB — PSA: PSA: 0.2 ng/mL (ref <=4.0)

## 2017-09-15 MED ORDER — DOXYCYCLINE HYCLATE 100 MG PO TABS: 100.0000 mg | ORAL_TABLET | Freq: Two times a day (BID) | ORAL | 0 refills | Status: DC

## 2017-09-15 NOTE — Progress Notes (Signed)
Patient: Luke [name] Male    DOB: 28-Sep-1946   71 y.o.   MRN: [id number] Visit Date: 09/15/2017  Today's Provider: Lelon Huh, MD   Chief Complaint  Patient presents with  . Groin Pain   Subjective:    Groin Pain  This is a new problem. Episode onset: 2 weeks ago. The problem has been gradually worsening. Pertinent negatives include no abdominal pain, flank pain, nausea, urgency or urinary retention.   He indicates source of pain is left testicle. He has actually has some pain intermittently for a few month, but has been more persistent the last 2 weeks, becoming severe 2-3 days ago.      No Known Allergies   Current Outpatient Prescriptions:  .  amLODipine (NORVASC) 10 MG tablet, TAKE 1 TABLET BY MOUTH DAILY, Disp: 90 tablet, Rfl: 4 .  Cholecalciferol (VITAMIN D) 2000 units CAPS, Take 1 capsule by mouth daily. Reported on 02/07/2016, Disp: , Rfl:  .  diclofenac (CATAFLAM) 50 MG tablet, Take 1 tablet (50 mg total) by mouth 3 (three) times daily as needed., Disp: 60 tablet, Rfl: 3 .  losartan-hydrochlorothiazide (HYZAAR) 100-12.5 MG tablet, TAKE 1 TABLET BY MOUTH EVERY DAY, Disp: 90 tablet, Rfl: 4 .  sertraline (ZOLOFT) 50 MG tablet, Take 50 mg by mouth as needed. , Disp: , Rfl:   Review of Systems  Gastrointestinal: Negative for abdominal pain and nausea.  Genitourinary: Negative for flank pain and urgency.    Social History  Substance Use Topics  . Smoking status: Never Smoker  . Smokeless tobacco: Never Used     Comment: smokes a Lexicographer with friends  . Alcohol use 1.2 oz/week    2 Cans of beer per week     Comment: occasional use   Objective:   BP (!) 138/98 (BP Location: Left Arm, Patient Position: Sitting, Cuff Size: Large)   Pulse 62   Temp 97.8 F (36.6 C) (Oral)   Resp 16   Wt 218 lb (98.9 kg)   SpO2 98% Comment: room air  BMI 28.76 kg/m  Vitals:   09/15/17 1503  BP: (!) 138/98  Pulse: 62  Resp: 16  Temp: 97.8 F (36.6 C)    TempSrc: Oral  SpO2: 98%  Weight: 218 lb (98.9 kg)     Physical Exam  General Appearance:    Alert, cooperative, no distress  Eyes:    PERRL, conjunctiva/corneas clear, EOM's intact       GU:   Moderately tender left testicle, more so posteriorly. No erythema.          Results for orders placed or performed in visit on 09/15/17  POCT Urinalysis Dipstick  Result Value Ref Range   Color, UA Amber    Clarity, UA clear    Glucose, UA negative    Bilirubin, UA small    Ketones, UA negative    Spec Grav, UA >=1.030 (A) 1.010 - 1.025   Blood, UA negative    pH, UA 6.0 5.0 - 8.0   Protein, UA negative    Urobilinogen, UA 0.2 0.2 or 1.0 E.U./dL   Nitrite, UA negative    Leukocytes, UA Negative Negative       Assessment & Plan:     1. Inguinal pain, unspecified laterality - POCT Urinalysis Dipstick  2. Epididymitis  - doxycycline (VIBRA-TABS) 100 MG tablet; Take 1 tablet (100 mg total) by mouth 2 (two) times daily.  Dispense: 20 tablet; Refill: 0  3. Prostate cancer ([place])  - PSA   Call if symptoms change or if not rapidly improving.          Lelon Huh, MD  [facility]

## 2017-09-16 ENCOUNTER — Telehealth: Payer: Self-pay

## 2017-09-16 DIAGNOSIS — G301 Alzheimer's disease with late onset: Secondary | ICD-10-CM | POA: Diagnosis not present

## 2017-09-16 DIAGNOSIS — E559 Vitamin D deficiency, unspecified: Secondary | ICD-10-CM | POA: Diagnosis not present

## 2017-09-16 DIAGNOSIS — F0280 Dementia in other diseases classified elsewhere without behavioral disturbance: Secondary | ICD-10-CM | POA: Diagnosis not present

## 2017-09-16 NOTE — Telephone Encounter (Signed)
lmtcb

## 2017-09-16 NOTE — Telephone Encounter (Signed)
Patient advised as below.  

## 2017-09-16 NOTE — Telephone Encounter (Signed)
-----   Message from Birdie Sons, MD sent at 09/16/2017  7:47 AM EDT ----- PSA is very low. No sign of recurrent prostate cancer. Check yearly.

## 2017-12-07 ENCOUNTER — Other Ambulatory Visit: Payer: Self-pay | Admitting: Family Medicine

## 2017-12-08 ENCOUNTER — Other Ambulatory Visit: Payer: Self-pay | Admitting: Family Medicine

## 2018-01-05 LAB — HM DIABETES EYE EXAM

## 2018-01-25 ENCOUNTER — Encounter: Payer: Self-pay | Admitting: Family Medicine

## 2018-01-25 ENCOUNTER — Ambulatory Visit (INDEPENDENT_AMBULATORY_CARE_PROVIDER_SITE_OTHER): Payer: Medicare Other | Admitting: Family Medicine

## 2018-01-25 VITALS — BP 120/80 | HR 90 | Temp 98.1°F | Resp 16 | Wt 220.0 lb

## 2018-01-25 DIAGNOSIS — L309 Dermatitis, unspecified: Secondary | ICD-10-CM | POA: Diagnosis not present

## 2018-01-25 MED ORDER — CLOTRIMAZOLE-BETAMETHASONE 1-0.05 % EX CREA: 1.0000 "application " | TOPICAL_CREAM | Freq: Two times a day (BID) | CUTANEOUS | 0 refills | Status: DC

## 2018-01-25 NOTE — Progress Notes (Signed)
       Patient: Luke [name] Male    DOB: 1946-12-26   72 y.o.   MRN: [id number] Visit Date: 01/25/2018  Today's Provider: Lelon Huh, MD   Chief Complaint  Patient presents with  . Rash   Subjective:    Patient has a small area on upper left side of chest that is red and itchy. Patient states he was trimming trees in yard over a month ago and that night began itching in in one spot on chest. Patient states he has been using otc antibiotic ointment and anti-itch cream with no long term relief.       No Known Allergies   Current Outpatient Medications:  .  amLODipine (NORVASC) 10 MG tablet, TAKE 1 TABLET BY MOUTH DAILY, Disp: 90 tablet, Rfl: 4 .  Cholecalciferol (VITAMIN D) 2000 units CAPS, Take 1 capsule by mouth daily. Reported on 02/07/2016, Disp: , Rfl:  .  diclofenac (CATAFLAM) 50 MG tablet, Take 1 tablet (50 mg total) by mouth 3 (three) times daily as needed., Disp: 60 tablet, Rfl: 3 .  losartan-hydrochlorothiazide (HYZAAR) 100-12.5 MG tablet, TAKE 1 TABLET BY MOUTH EVERY DAY, Disp: 90 tablet, Rfl: 1 .  sertraline (ZOLOFT) 50 MG tablet, Take 50 mg by mouth as needed. , Disp: , Rfl:   Review of Systems  Constitutional: Negative for appetite change, chills and fever.  Respiratory: Negative for chest tightness, shortness of breath and wheezing.   Cardiovascular: Negative for chest pain and palpitations.  Gastrointestinal: Negative for abdominal pain, nausea and vomiting.    Social History   Tobacco Use  . Smoking status: Never Smoker  . Smokeless tobacco: Never Used  . Tobacco comment: smokes a Lexicographer with friends  Substance Use Topics  . Alcohol use: Yes    Alcohol/week: 1.2 oz    Types: 2 Cans of beer per week    Comment: occasional use   Objective:   BP 120/80 (BP Location: Right Arm, Patient Position: Sitting, Cuff Size: Large)   Pulse 90   Temp 98.1 F (36.7 C) (Oral)   Resp 16   Wt 220 lb (99.8 kg)   SpO2 95%   BMI 29.03 kg/m    Vitals:   01/25/18 1641  BP: 120/80  Pulse: 90  Resp: 16  Temp: 98.1 F (36.7 C)  TempSrc: Oral  SpO2: 95%  Weight: 220 lb (99.8 kg)     Physical Exam  Skin eruption chest as per HPI.     Assessment & Plan:     1. Dermatitis   - clotrimazole-betamethasone (LOTRISONE) cream; Apply 1 application topically 2 (two) times daily.  Dispense: 30 g; Refill: 0  Call if symptoms change or if not rapidly improving.          Lelon Huh, MD  [facility]

## 2018-04-06 ENCOUNTER — Ambulatory Visit: Payer: Medicare Other | Admitting: Family Medicine

## 2018-04-06 NOTE — Progress Notes (Deleted)
       Patient: Luke [name] Male    DOB: 11-12-1946   72 y.o.   MRN: [id number] Visit Date: 04/06/2018  Today's Provider: Lelon Huh, MD   No chief complaint on file.  Subjective:    HPI     No Known Allergies   Current Outpatient Medications:  .  amLODipine (NORVASC) 10 MG tablet, TAKE 1 TABLET BY MOUTH DAILY, Disp: 90 tablet, Rfl: 4 .  Cholecalciferol (VITAMIN D) 2000 units CAPS, Take 1 capsule by mouth daily. Reported on 02/07/2016, Disp: , Rfl:  .  clotrimazole-betamethasone (LOTRISONE) cream, Apply 1 application topically 2 (two) times daily., Disp: 30 g, Rfl: 0 .  diclofenac (CATAFLAM) 50 MG tablet, Take 1 tablet (50 mg total) by mouth 3 (three) times daily as needed., Disp: 60 tablet, Rfl: 3 .  losartan-hydrochlorothiazide (HYZAAR) 100-12.5 MG tablet, TAKE 1 TABLET BY MOUTH EVERY DAY, Disp: 90 tablet, Rfl: 1 .  sertraline (ZOLOFT) 50 MG tablet, Take 50 mg by mouth as needed. , Disp: , Rfl:   Review of Systems  Constitutional: Negative for appetite change, chills and fever.  Respiratory: Negative for chest tightness, shortness of breath and wheezing.   Cardiovascular: Negative for chest pain and palpitations.  Gastrointestinal: Negative for abdominal pain, nausea and vomiting.    Social History   Tobacco Use  . Smoking status: Never Smoker  . Smokeless tobacco: Never Used  . Tobacco comment: smokes a Lexicographer with friends  Substance Use Topics  . Alcohol use: Yes    Alcohol/week: 1.2 oz    Types: 2 Cans of beer per week    Comment: occasional use   Objective:   There were no vitals taken for this visit. There were no vitals filed for this visit.   Physical Exam      Assessment & Plan:           Lelon Huh, MD  [facility]

## 2018-04-07 ENCOUNTER — Telehealth: Payer: Self-pay

## 2018-04-07 NOTE — Telephone Encounter (Signed)
LM for pt to CB and schedule AWV and CPE.  -MM

## 2018-04-13 NOTE — Telephone Encounter (Signed)
Pt was scheduled for an AWV for 04/14/18. With pt's insurance (tradiational medicare), pt needs to wait 366 days in between wellness visits. Last AWV was 05/12/17. Pt needs to r/s to a day after 05/12/18. LMTCB -MM

## 2018-04-26 NOTE — Telephone Encounter (Signed)
R/S for 06/02/18 @ 9 AM.

## 2018-05-03 ENCOUNTER — Ambulatory Visit (INDEPENDENT_AMBULATORY_CARE_PROVIDER_SITE_OTHER): Payer: Medicare Other | Admitting: Family Medicine

## 2018-05-03 ENCOUNTER — Encounter: Payer: Self-pay | Admitting: Family Medicine

## 2018-05-03 VITALS — BP 132/88 | HR 69 | Temp 97.9°F | Resp 15 | Wt 216.6 lb

## 2018-05-03 DIAGNOSIS — N509 Disorder of male genital organs, unspecified: Secondary | ICD-10-CM

## 2018-05-03 DIAGNOSIS — N50819 Testicular pain, unspecified: Secondary | ICD-10-CM

## 2018-05-03 LAB — POCT URINALYSIS DIPSTICK
Bilirubin, UA: NEGATIVE
GLUCOSE UA: NEGATIVE
Ketones, UA: NEGATIVE
LEUKOCYTES UA: NEGATIVE
Nitrite, UA: NEGATIVE
Protein, UA: NEGATIVE
RBC UA: NEGATIVE
Spec Grav, UA: 1.025 (ref 1.010–1.025)
Urobilinogen, UA: 0.2 E.U./dL
pH, UA: 6 (ref 5.0–8.0)

## 2018-05-03 MED ORDER — LEVOFLOXACIN 500 MG PO TABS: 500.0000 mg | ORAL_TABLET | Freq: Every day | ORAL | 0 refills | Status: DC

## 2018-05-03 NOTE — Patient Instructions (Signed)
Please follow up with the urologist as scheduled.

## 2018-05-03 NOTE — Progress Notes (Signed)
  Subjective:     Patient ID: Luke [name], male   DOB: 19-Feb-1946, 72 y.o.   MRN: [id number] Chief Complaint  Patient presents with  . Testicle Pain    Patient comes in office today accompanied by his wife with complaints of scrotum pain that has been present for years but intermittent. Patient states in the past 2 weeks or more the pain has gradually been getting worse. Patient reports pain and swelling on both side of sctorum, wife recalls that patient had a implant removed several years ago and since than she states that patient has complained of pain.    HPI Has had chronic complaints of testicle discomfort/ED for years with urology evaluation @ [facility], Dr. Bernardo Heater, last year. He has f/u planned next month. Wife states he has been upset every since he was unable to have a prosthetic implant replaced years ago. Hx of dementia probably Alzheimer's and prostate cancer. Wife also states he is dealing with the recent death of a daughter from breast cancer.  Review of Systems     Objective:   Physical Exam  Constitutional: He appears well-developed and well-nourished. No distress.  Genitourinary: Penis normal.  Genitourinary Comments: Mild posterior testicle discomfort R > L. Testicles appear symmetrical and not swollen.       Assessment:    1. Persistent pain in testicle: will cover for epididymitis pending urology evaluation. - POCT urinalysis dipstick    Plan:    F/u with urology as scheduled.

## 2018-05-16 ENCOUNTER — Emergency Department
Admission: EM | Admit: 2018-05-16 | Discharge: 2018-05-16 | Disposition: A | Discharge status: Home / Self Care | Payer: Medicare Other | Attending: Emergency Medicine | Admitting: Emergency Medicine

## 2018-05-16 ENCOUNTER — Other Ambulatory Visit: Payer: Self-pay

## 2018-05-16 DIAGNOSIS — N5082 Scrotal pain: Secondary | ICD-10-CM | POA: Diagnosis not present

## 2018-05-16 DIAGNOSIS — Z8546 Personal history of malignant neoplasm of prostate: Secondary | ICD-10-CM | POA: Diagnosis not present

## 2018-05-16 DIAGNOSIS — I10 Essential (primary) hypertension: Secondary | ICD-10-CM | POA: Diagnosis not present

## 2018-05-16 DIAGNOSIS — Z79899 Other long term (current) drug therapy: Secondary | ICD-10-CM | POA: Insufficient documentation

## 2018-05-16 NOTE — ED Triage Notes (Addendum)
Pt in via POV with complaints of intermittent pain and swelling to scrotum x "a good while."  Pt unable to give me a time frame.  Vitals WDL.  NAD noted at this time.

## 2018-05-16 NOTE — ED Provider Notes (Signed)
[name] Emergency Department Provider Note   ____________________________________________   I have reviewed the triage vital signs and the nursing notes.   HISTORY  Chief Complaint Groin Pain   History limited by: Not Limited   HPI Luke [name] is a 72 y.o. male who presents to the emergency department today because of concerns for scrotal pain.  He states he has had pain for roughly 20 years.  He states it all stems from when he had a penile implant removed.  He does have an appointment scheduled with urology this week.  He states he came in today because the pain is still there.  He did not take any pain medication today.  It is somewhat relieved when he wears jock straps and tight underwear.   Per medical record review patient has a history of prostate cancer.   Past Medical History:  Diagnosis Date  . Acquired absence of kidney 12/29/1988  . Brachial neuritis 04/02/2009  . Claudication ([place]) 02/06/2016  . 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 Dysphagia 04/02/2009  . GERD (gastroesophageal reflux disease) 02/06/2016   Schatski ring seen on EGD 07-10-11   . History of MRSA infection 11/18/2007  . Hyperlipidemia, mixed 12/29/1998  . Hypertension 04/02/2009  . Hypogonadism male 02/06/2016  . Insomnia 02/06/2016  . LBP (low back pain) 07/13/2013  . Numbness of toes 02/06/2016  . Palpitations 02/06/2016  . Prediabetes 02/08/2016   A1c=6.2 02/08/16   . Prostate cancer ([place]) 2014   Seed Implants  . Single kidney   . Vitamin D deficiency 02/06/2016    Patient Active Problem List   Diagnosis Date Noted  . Hearing difficulty 05/12/2017  . Memory impairment 10/16/2016  . Inguinal pain 05/12/2016  . Erectile dysfunction after prostate brachytherapy 02/12/2016  . Solitary kidney 02/12/2016  . Prediabetes 02/08/2016  . Claudication ([place]) 02/06/2016  . GERD (gastroesophageal reflux disease) 02/06/2016  . Headache 02/06/2016  . Hypogonadism male  02/06/2016  . Insomnia 02/06/2016  . Numbness of toes 02/06/2016  . Palpitations 02/06/2016  . 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 Vitamin D deficiency 02/06/2016  . LBP (low back pain) 07/13/2013  . Prostate cancer ([place]) 02/01/2010  . Dysphagia 04/02/2009  . Hypertension 04/02/2009  . Brachial neuritis 04/02/2009  . History of MRSA infection 11/18/2007  . Hyperlipidemia, mixed 12/29/1998  . Acquired absence of kidney 12/29/1988    Past Surgical History:  Procedure Laterality Date  . ABI  07/09/2011   Normal; Left= 1.21, Right=1.00. Triphasic waver forms  . Carotid Doppler Ultrasound  10/18/2012   Minimal soft plague both carotids. No hemodynamically significant stenosis  . CHOLECYSTECTOMY  2001  . Lumbar Spine X Ray  11/28/2011   Mild DDD L1- L2. Anterior heigt loss of T12-L1 vertebral bodies  . NEPHRECTOMY Right 2001   done by Dr. Eliberto Ivory for Angiomyolipoma  . PENILE PROSTHESIS  REMOVAL  11/28/2013   Dr. Eliberto Ivory  . PENILE PROSTHESIS IMPLANT  2008  . Photovaporization of prostate with green light laser  01/19/2012   Dr. Rogers Blocker  . [place]  . UPPER GI ENDOSCOPY  07/10/2011   Done by Dr. Sharen Counter. Revealed Schatski rings    Prior to Admission medications   Medication Sig Start Date End Date Taking? Authorizing Provider  amLODipine (NORVASC) 10 MG tablet TAKE 1 TABLET BY MOUTH DAILY 09/01/17   Birdie Sons, MD  Cholecalciferol (VITAMIN D) 2000 units CAPS Take 1 capsule by mouth daily. Reported on  02/07/2016    [provider]  clotrimazole-betamethasone (LOTRISONE) cream Apply 1 application topically 2 (two) times daily. Patient not taking: Reported on 05/03/2018 01/25/18   Birdie Sons, MD  diclofenac (CATAFLAM) 50 MG tablet Take 1 tablet (50 mg total) by mouth 3 (three) times daily as needed. Patient not taking: Reported on 05/03/2018 09/01/17   Birdie Sons, MD  levofloxacin (LEVAQUIN) 500 MG tablet Take 1 tablet (500 mg total) by mouth  daily. 05/03/18   Carmon Ginsberg, PA  losartan-hydrochlorothiazide (HYZAAR) 100-12.5 MG tablet TAKE 1 TABLET BY MOUTH EVERY DAY 12/08/17   Birdie Sons, MD  Multiple Vitamins-Minerals (MULTIVITAMIN WITH MINERALS) tablet Take by mouth.    [provider]  sertraline (ZOLOFT) 50 MG tablet Take 50 mg by mouth as needed.     [provider]    Allergies Patient has no known allergies.  Family History  Problem Relation Age of Onset  . Heart attack Mother   . Hypertension Father   . Prostate cancer Neg Hx   . Bladder Cancer Neg Hx   . Kidney cancer Neg Hx     Social History Social History   Tobacco Use  . Smoking status: Never Smoker  . Smokeless tobacco: Never Used  . Tobacco comment: smokes a Lexicographer with friends  Substance Use Topics  . Alcohol use: Yes    Alcohol/week: 1.2 oz    Types: 2 Cans of beer per week    Comment: occasional use  . Drug use: No    Review of Systems Constitutional: No fever/chills Eyes: No visual changes. ENT: No sore throat. Cardiovascular: Denies chest pain. Respiratory: Denies shortness of breath. Gastrointestinal: No abdominal pain.  No nausea, no vomiting.  No diarrhea.   Genitourinary: Scrotal pain. Musculoskeletal: Negative for back pain. Skin: Negative for rash. Neurological: Negative for headaches, focal weakness or numbness.  ____________________________________________   PHYSICAL EXAM:  VITAL SIGNS: ED Triage Vitals  Enc Vitals Group     BP 05/16/18 2115 96/70     Pulse Rate 05/16/18 2115 (!) 51     Resp 05/16/18 2115 16     Temp 05/16/18 2115 97.6 F (36.4 C)     Temp Source 05/16/18 2115 Oral     SpO2 05/16/18 2115 99 %     Weight 05/16/18 2117 212 lb (96.2 kg)     Height 05/16/18 2117 6' (1.829 m)     Head Circumference --      Peak Flow --      Pain Score 05/16/18 2115 2    Constitutional: Alert and oriented. Well appearing and in no distress. Eyes: Conjunctivae are normal.   ENT   Head: Normocephalic and atraumatic.   Nose: No congestion/rhinnorhea.   Mouth/Throat: Mucous membranes are moist.   Neck: No stridor. Hematological/Lymphatic/Immunilogical: No cervical lymphadenopathy. Cardiovascular: Normal rate, regular rhythm.  No murmurs, rubs, or gallops.  Respiratory: Normal respiratory effort without tachypnea nor retractions. Breath sounds are clear and equal bilaterally. No wheezes/rales/rhonchi. Gastrointestinal: Soft and non tender. No rebound. No guarding.  Genitourinary: Bilateral descended testis. No edema or skin changes. Some discomfort to palpation.  Musculoskeletal: Normal range of motion in all extremities. No lower extremity edema. Neurologic:  Normal speech and language. No gross focal neurologic deficits are appreciated.  Skin:  Skin is warm, dry and intact. No rash noted. Psychiatric: Mood and affect are normal. Speech and behavior are normal. Patient exhibits appropriate insight and judgment.  ____________________________________________    LABS (pertinent  positives/negatives)  None  ____________________________________________   EKG  None  ____________________________________________    RADIOLOGY  None  ____________________________________________   PROCEDURES  Procedures  ____________________________________________   INITIAL IMPRESSION / ASSESSMENT AND PLAN / ED COURSE  Pertinent labs & imaging results that were available during my care of the patient were reviewed by me and considered in my medical decision making (see chart for details).  Patient presented to the emergency department today because of concerns for chronic scrotal pain.  On exam and no skin changes warmth or fluctuance.  No edema.  At this point I doubt scrotal infection.  Given chronicity pain doubt torsion.  Did have a discussion with the patient.  He does have appointment already scheduled with specialist this week.  Did offer to  perform ultrasound however he felt comfortable deferring until he saw the urologist.  Did recommend the patient start taking pain medication.  ____________________________________________   FINAL CLINICAL IMPRESSION(S) / ED DIAGNOSES  Final diagnoses:  Scrotal pain     Note: This dictation was prepared with Dragon dictation. Any transcriptional errors that result from this process are unintentional     Nance Pear, MD 05/16/18 2304

## 2018-05-16 NOTE — Discharge Instructions (Addendum)
Please seek medical attention for any high fevers, chest pain, shortness of breath, change in behavior, persistent vomiting, bloody stool or any other new or concerning symptoms.  

## 2018-05-20 ENCOUNTER — Ambulatory Visit (INDEPENDENT_AMBULATORY_CARE_PROVIDER_SITE_OTHER): Payer: Medicare Other | Admitting: Urology

## 2018-05-20 ENCOUNTER — Encounter: Payer: Self-pay | Admitting: Urology

## 2018-05-20 VITALS — BP 149/83 | HR 61 | Ht 72.0 in | Wt 212.6 lb

## 2018-05-20 DIAGNOSIS — R1030 Lower abdominal pain, unspecified: Secondary | ICD-10-CM | POA: Diagnosis not present

## 2018-05-20 DIAGNOSIS — N5082 Scrotal pain: Secondary | ICD-10-CM

## 2018-05-20 DIAGNOSIS — N5235 Erectile dysfunction following radiation therapy: Secondary | ICD-10-CM

## 2018-05-20 LAB — URINALYSIS, COMPLETE
Bilirubin, UA: NEGATIVE
Glucose, UA: NEGATIVE
Leukocytes, UA: NEGATIVE
NITRITE UA: NEGATIVE
PH UA: 5.5 (ref 5.0–7.5)
RBC UA: NEGATIVE
UUROB: 2 mg/dL — AB (ref 0.2–1.0)

## 2018-05-20 MED ORDER — TRAMADOL HCL 50 MG PO TABS: 50.0000 mg | ORAL_TABLET | Freq: Two times a day (BID) | ORAL | 0 refills | Status: DC | PRN

## 2018-05-20 NOTE — Progress Notes (Signed)
05/20/2018 1:56 PM   AVIN GIBBONS [birth year] [id number]  Referring provider: Birdie Sons, [address] [place], [place] [address]  Chief Complaint  Patient presents with  . Groin Pain  . Erectile Dysfunction    HPI: 72 year old male who I have previously seen for erectile dysfunction and chronic scrotal pain.  Refer to my [employer] note of 02/09/2017 in [place].  He is presently using a vacuum erection device for his ED.  His wife states it is effective however he gets frustrated using it.  He had worsening scrotal pain in early May.  He saw his PCP and was also seen in the ED.  He was treated with a course of Levaquin by his PCP.  He is unable to identify any precipitating, aggravating or alleviating factors to his pain.  Severity is rated moderate.  He states his pain is on the left however when reviewing his PCP notes he was more tender on the right.  PMH: Past Medical History:  Diagnosis Date  . Acquired absence of kidney 12/29/1988  . Brachial neuritis 04/02/2009  . Claudication ([place]) 02/06/2016  . 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 Dysphagia 04/02/2009  . GERD (gastroesophageal reflux disease) 02/06/2016   Schatski ring seen on EGD 07-10-11   . History of MRSA infection 11/18/2007  . Hyperlipidemia, mixed 12/29/1998  . Hypertension 04/02/2009  . Hypogonadism male 02/06/2016  . Insomnia 02/06/2016  . LBP (low back pain) 07/13/2013  . Numbness of toes 02/06/2016  . Palpitations 02/06/2016  . Prediabetes 02/08/2016   A1c=6.2 02/08/16   . Prostate cancer ([place]) 2014   Seed Implants  . Single kidney   . Vitamin D deficiency 02/06/2016    Surgical History: Past Surgical History:  Procedure Laterality Date  . ABI  07/09/2011   Normal; Left= 1.21, Right=1.00. Triphasic waver forms  . Carotid Doppler Ultrasound  10/18/2012   Minimal soft plague both carotids. No hemodynamically significant stenosis  . CHOLECYSTECTOMY  2001  . Lumbar Spine X Ray   11/28/2011   Mild DDD L1- L2. Anterior heigt loss of T12-L1 vertebral bodies  . NEPHRECTOMY Right 2001   done by Dr. Eliberto Ivory for Angiomyolipoma  . PENILE PROSTHESIS  REMOVAL  11/28/2013   Dr. Eliberto Ivory  . PENILE PROSTHESIS IMPLANT  2008  . Photovaporization of prostate with green light laser  01/19/2012   Dr. Rogers Blocker  . [place]  . UPPER GI ENDOSCOPY  07/10/2011   Done by Dr. Sharen Counter. Revealed Schatski rings    Home Medications:  Allergies as of 05/20/2018   No Known Allergies     Medication List        Accurate as of 05/20/18  1:56 PM. Always use your most recent med list.          amLODipine 10 MG tablet Commonly known as:  NORVASC TAKE 1 TABLET BY MOUTH DAILY   clotrimazole-betamethasone cream Commonly known as:  LOTRISONE Apply 1 application topically 2 (two) times daily.   diclofenac 50 MG tablet Commonly known as:  CATAFLAM Take 1 tablet (50 mg total) by mouth 3 (three) times daily as needed.   losartan-hydrochlorothiazide 100-12.5 MG tablet Commonly known as:  HYZAAR TAKE 1 TABLET BY MOUTH EVERY DAY   multivitamin with minerals tablet Take by mouth.   sertraline 50 MG tablet Commonly known as:  ZOLOFT Take 50 mg by mouth as needed.   Vitamin D 2000 units Caps Take 1 capsule by mouth  daily. Reported on 02/07/2016       Allergies: No Known Allergies  Family History: Family History  Problem Relation Age of Onset  . Heart attack Mother   . Hypertension Father   . Prostate cancer Neg Hx   . Bladder Cancer Neg Hx   . Kidney cancer Neg Hx     Social History:  reports that he has never smoked. He has never used smokeless tobacco. He reports that he drinks about 1.2 oz of alcohol per week. He reports that he does not use drugs.  ROS: UROLOGY Frequent Urination?: No Hard to postpone urination?: No Burning/pain with urination?: No Get up at night to urinate?: No Leakage of urine?: No Urine stream starts and stops?: No Trouble starting stream?:  No Do you have to strain to urinate?: No Blood in urine?: No Urinary tract infection?: No Sexually transmitted disease?: No Injury to kidneys or bladder?: Yes Painful intercourse?: No Weak stream?: No Erection problems?: Yes Penile pain?: Yes  Gastrointestinal Nausea?: No Vomiting?: No Indigestion/heartburn?: No Diarrhea?: No Constipation?: No  Constitutional Fever: No Night sweats?: No Weight loss?: Yes Fatigue?: No  Skin Skin rash/lesions?: No Itching?: No  Eyes Blurred vision?: Yes Double vision?: No  Ears/Nose/Throat Sore throat?: No Sinus problems?: No  Hematologic/Lymphatic Swollen glands?: No Easy bruising?: No  Cardiovascular Leg swelling?: No Chest pain?: No  Respiratory Cough?: No Shortness of breath?: No  Endocrine Excessive thirst?: No  Musculoskeletal Back pain?: No Joint pain?: No  Neurological Headaches?: Yes Dizziness?: Yes  Psychologic Depression?: Yes Anxiety?: Yes  Physical Exam: BP (!) 149/83 (BP Location: Left Arm, Patient Position: Sitting, Cuff Size: Large)   Pulse 61   Ht 6' (1.829 m)   Wt 212 lb 9.6 oz (96.4 kg)   SpO2 99%   BMI 28.83 kg/m   Constitutional:  Alert. No acute distress. HEENT: [place] AT, moist mucus membranes.  Trachea midline, no masses. Cardiovascular: No clubbing, cyanosis, or edema. Respiratory: Normal respiratory effort, no increased work of breathing. GI: Abdomen is soft, nontender, nondistended, no abdominal masses GU: No CVA tenderness.  Penis circumcised without lesions.  Testes descended bilaterally without masses or tenderness.  No palpable paratesticular abnormalities.  No significant tenderness noted.  No hernia appreciated. Lymph: No cervical or inguinal lymphadenopathy. Skin: No rashes, bruises or suspicious lesions. Neurologic: Grossly intact, no focal deficits, moving all 4 extremities. Psychiatric: Normal mood and affect.  Laboratory Data: Lab Results  Component Value Date   WBC  5.8 09/09/2016   HGB 12.1 (L) 09/09/2016   HCT 36.3 (L) 09/09/2016   MCV 65 (L) 09/09/2016   PLT 287 09/09/2016    Lab Results  Component Value Date   CREATININE 1.35 (H) 05/12/2017    Lab Results  Component Value Date   PSA 0.2 09/15/2017    Urinalysis Dipstick/microscopy negative   Assessment & Plan:    1. Scrotal pain No significant abnormalities on exam.  Will schedule a scrotal ultrasound.  Trial of tramadol for his pain.  2. Erectile dysfunction after prostate brachytherapy Previous explanted penile prosthesis and unlikely other treatments will be effective.  Would recommend continuing the vacuum erection device.  He states he was considering having a penile implant replaced however his wife would like to concentrate on treating his scrotal pain for now.    Abbie Sons, [place] [address], [place], [place] [address] [phone]

## 2018-06-02 ENCOUNTER — Ambulatory Visit (INDEPENDENT_AMBULATORY_CARE_PROVIDER_SITE_OTHER): Payer: Medicare Other

## 2018-06-02 VITALS — BP 132/68 | HR 62 | Temp 99.2°F | Ht 73.0 in | Wt 215.0 lb

## 2018-06-02 DIAGNOSIS — Z0000 Encounter for general adult medical examination without abnormal findings: Secondary | ICD-10-CM

## 2018-06-02 NOTE — Patient Instructions (Signed)
Luke [name] , Thank you for taking time to come for your Medicare Wellness Visit. I appreciate your ongoing commitment to your health goals. Please review the following plan we discussed and let me know if I can assist you in the future.   Screening recommendations/referrals: Colonoscopy: Up to date Recommended yearly ophthalmology/optometry visit for glaucoma screening and checkup Recommended yearly dental visit for hygiene and checkup  Vaccinations: Influenza vaccine: Up to date Pneumococcal vaccine: Up to date Tdap vaccine: Up to date Shingles vaccine: Pt declines today.     Advanced directives: Advance directive discussed with you today. I have provided a copy for you to complete at home and have notarized. Once this is complete please bring a copy in to our office so we can scan it into your chart.  Conditions/risks identified: Recommend increasing water intake to 4 glasses a day.   Next appointment: 06/11/18 @ 9:00 AM with Dr Caryn Section.   Preventive Care 71 Years and Older, Male Preventive care refers to lifestyle choices and visits with your health care provider that can promote health and wellness. What does preventive care include?  A yearly physical exam. This is also called an annual well check.  Dental exams once or twice a year.  Routine eye exams. Ask your health care provider how often you should have your eyes checked.  Personal lifestyle choices, including:  Daily care of your teeth and gums.  Regular physical activity.  Eating a healthy diet.  Avoiding tobacco and drug use.  Limiting alcohol use.  Practicing safe sex.  Taking low doses of aspirin every day.  Taking vitamin and mineral supplements as recommended by your health care provider. What happens during an annual well check? The services and screenings done by your health care provider during your annual well check will depend on your age, overall health, lifestyle risk factors, and family history  of disease. Counseling  Your health care provider may ask you questions about your:  Alcohol use.  Tobacco use.  Drug use.  Emotional well-being.  Home and relationship well-being.  Sexual activity.  Eating habits.  History of falls.  Memory and ability to understand (cognition).  Work and work Statistician. Screening  You may have the following tests or measurements:  Height, weight, and BMI.  Blood pressure.  Lipid and cholesterol levels. These may be checked every 5 years, or more frequently if you are over 36 years old.  Skin check.  Lung cancer screening. You may have this screening every year starting at age 88 if you have a 30-pack-year history of smoking and currently smoke or have quit within the past 15 years.  Fecal occult blood test (FOBT) of the stool. You may have this test every year starting at age 25.  Flexible sigmoidoscopy or colonoscopy. You may have a sigmoidoscopy every 5 years or a colonoscopy every 10 years starting at age 73.  Prostate cancer screening. Recommendations will vary depending on your family history and other risks.  Hepatitis C blood test.  Hepatitis B blood test.  Sexually transmitted disease (STD) testing.  Diabetes screening. This is done by checking your blood sugar (glucose) after you have not eaten for a while (fasting). You may have this done every 1-3 years.  Abdominal aortic aneurysm (AAA) screening. You may need this if you are a current or former smoker.  Osteoporosis. You may be screened starting at age 42 if you are at high risk. Talk with your health care provider about your test results,  treatment options, and if necessary, the need for more tests. Vaccines  Your health care provider may recommend certain vaccines, such as:  Influenza vaccine. This is recommended every year.  Tetanus, diphtheria, and acellular pertussis (Tdap, Td) vaccine. You may need a Td booster every 10 years.  Zoster vaccine. You may  need this after age 35.  Pneumococcal 13-valent conjugate (PCV13) vaccine. One dose is recommended after age 76.  Pneumococcal polysaccharide (PPSV23) vaccine. One dose is recommended after age 74. Talk to your health care provider about which screenings and vaccines you need and how often you need them. This information is not intended to replace advice given to you by your health care provider. Make sure you discuss any questions you have with your health care provider. Document Released: 01/11/2016 Document Revised: 09/03/2016 Document Reviewed: 10/16/2015 Elsevier Interactive Patient Education  2017 [place] Prevention in the Home Falls can cause injuries. They can happen to people of all ages. There are many things you can do to make your home safe and to help prevent falls. What can I do on the outside of my home?  Regularly fix the edges of walkways and driveways and fix any cracks.  Remove anything that might make you trip as you walk through a door, such as a raised step or threshold.  Trim any bushes or trees on the path to your home.  Use bright outdoor lighting.  Clear any walking paths of anything that might make someone trip, such as rocks or tools.  Regularly check to see if handrails are loose or broken. Make sure that both sides of any steps have handrails.  Any raised decks and porches should have guardrails on the edges.  Have any leaves, snow, or ice cleared regularly.  Use sand or salt on walking paths during winter.  Clean up any spills in your garage right away. This includes oil or grease spills. What can I do in the bathroom?  Use night lights.  Install grab bars by the toilet and in the tub and shower. Do not use towel bars as grab bars.  Use non-skid mats or decals in the tub or shower.  If you need to sit down in the shower, use a plastic, non-slip stool.  Keep the floor dry. Clean up any water that spills on the floor as soon as it  happens.  Remove soap buildup in the tub or shower regularly.  Attach bath mats securely with double-sided non-slip rug tape.  Do not have throw rugs and other things on the floor that can make you trip. What can I do in the bedroom?  Use night lights.  Make sure that you have a light by your bed that is easy to reach.  Do not use any sheets or blankets that are too big for your bed. They should not hang down onto the floor.  Have a firm chair that has side arms. You can use this for support while you get dressed.  Do not have throw rugs and other things on the floor that can make you trip. What can I do in the kitchen?  Clean up any spills right away.  Avoid walking on wet floors.  Keep items that you use a lot in easy-to-reach places.  If you need to reach something above you, use a strong step stool that has a grab bar.  Keep electrical cords out of the way.  Do not use floor polish or wax that makes floors  slippery. If you must use wax, use non-skid floor wax.  Do not have throw rugs and other things on the floor that can make you trip. What can I do with my stairs?  Do not leave any items on the stairs.  Make sure that there are handrails on both sides of the stairs and use them. Fix handrails that are broken or loose. Make sure that handrails are as long as the stairways.  Check any carpeting to make sure that it is firmly attached to the stairs. Fix any carpet that is loose or worn.  Avoid having throw rugs at the top or bottom of the stairs. If you do have throw rugs, attach them to the floor with carpet tape.  Make sure that you have a light switch at the top of the stairs and the bottom of the stairs. If you do not have them, ask someone to add them for you. What else can I do to help prevent falls?  Wear shoes that:  Do not have high heels.  Have rubber bottoms.  Are comfortable and fit you well.  Are closed at the toe. Do not wear sandals.  If you  use a stepladder:  Make sure that it is fully opened. Do not climb a closed stepladder.  Make sure that both sides of the stepladder are locked into place.  Ask someone to hold it for you, if possible.  Clearly mark and make sure that you can see:  Any grab bars or handrails.  First and last steps.  Where the edge of each step is.  Use tools that help you move around (mobility aids) if they are needed. These include:  Canes.  Walkers.  Scooters.  Crutches.  Turn on the lights when you go into a dark area. Replace any light bulbs as soon as they burn out.  Set up your furniture so you have a clear path. Avoid moving your furniture around.  If any of your floors are uneven, fix them.  If there are any pets around you, be aware of where they are.  Review your medicines with your doctor. Some medicines can make you feel dizzy. This can increase your chance of falling. Ask your doctor what other things that you can do to help prevent falls. This information is not intended to replace advice given to you by your health care provider. Make sure you discuss any questions you have with your health care provider. Document Released: 10/11/2009 Document Revised: 05/22/2016 Document Reviewed: 01/19/2015 Elsevier Interactive Patient Education  2017 [employer].

## 2018-06-02 NOTE — Progress Notes (Signed)
Subjective:   Luke [name] is a 72 y.o. male who presents for Medicare Annual/Subsequent preventive examination.  Review of Systems:  N/A  Cardiac Risk Factors include: advanced age (>49men, >40 women);dyslipidemia;hypertension;male gender     Objective:    Vitals: BP 132/68 (BP Location: Right Arm)   Pulse 62   Temp 99.2 F (37.3 C) (Oral)   Ht 6\' 1"  (1.854 m)   Wt 215 lb (97.5 kg)   BMI 28.37 kg/m   Body mass index is 28.37 kg/m.  Advanced Directives 06/02/2018 05/12/2017  Does Patient Have a Medical Advance Directive? No Yes  Type of Advance Directive - [place];Living will  Copy of [place] in Chart? - No - copy requested  Would patient like information on creating a medical advance directive? Yes (MAU/Ambulatory/Procedural Areas - Information given) -    Tobacco Social History   Tobacco Use  Smoking Status Never Smoker  Smokeless Tobacco Never Used  Tobacco Comment   smokes a Lexicographer with friends     Counseling given: Not Answered Comment: smokes a Lexicographer with friends   Clinical Intake:  Pre-visit preparation completed: Yes  Pain : No/denies pain Pain Score: 0-No pain     Nutritional Status: BMI 25 -29 Overweight Nutritional Risks: None Diabetes: No  How often do you need to have someone help you when you read instructions, pamphlets, or other written materials from your doctor or pharmacy?: 3 - Sometimes  Interpreter Needed?: No  Information entered by :: [facility], LPN  Past Medical History:  Diagnosis Date  . Acquired absence of kidney 12/29/1988  . Brachial neuritis 04/02/2009  . Claudication ([place]) 02/06/2016  . 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 Dysphagia 04/02/2009  . GERD (gastroesophageal reflux disease) 02/06/2016   Schatski ring seen on EGD 07-10-11   . History of MRSA infection 11/18/2007  . Hyperlipidemia, mixed 12/29/1998  . Hypertension 04/02/2009    . Hypogonadism male 02/06/2016  . Insomnia 02/06/2016  . LBP (low back pain) 07/13/2013  . Numbness of toes 02/06/2016  . Palpitations 02/06/2016  . Prediabetes 02/08/2016   A1c=6.2 02/08/16   . Prostate cancer ([place]) 2014   Seed Implants  . Single kidney   . Vitamin D deficiency 02/06/2016   Past Surgical History:  Procedure Laterality Date  . ABI  07/09/2011   Normal; Left= 1.21, Right=1.00. Triphasic waver forms  . Carotid Doppler Ultrasound  10/18/2012   Minimal soft plague both carotids. No hemodynamically significant stenosis  . CHOLECYSTECTOMY  2001  . Lumbar Spine X Ray  11/28/2011   Mild DDD L1- L2. Anterior heigt loss of T12-L1 vertebral bodies  . NEPHRECTOMY Right 2001   done by Dr. Eliberto Ivory for Angiomyolipoma  . PENILE PROSTHESIS  REMOVAL  11/28/2013   Dr. Eliberto Ivory  . PENILE PROSTHESIS IMPLANT  2008  . Photovaporization of prostate with green light laser  01/19/2012   Dr. Rogers Blocker  . [place]  . UPPER GI ENDOSCOPY  07/10/2011   Done by Dr. Sharen Counter. Revealed Schatski rings   Family History  Problem Relation Age of Onset  . Heart attack Mother   . Hypertension Father   . Prostate cancer Neg Hx   . Bladder Cancer Neg Hx   . Kidney cancer Neg Hx    Social History   Socioeconomic History  . Marital status: Married    Spouse name: Not on file  . Number of children: 2  . Years  of education: Not on file  . Highest education level: 10th grade  Occupational History  . Occupation: Retired  Scientific laboratory technician  . Financial resource strain: Not on file  . Food insecurity:    Worry: Never true    Inability: Never true  . Transportation needs:    Medical: No    Non-medical: No  Tobacco Use  . Smoking status: Never Smoker  . Smokeless tobacco: Never Used  . Tobacco comment: smokes a Lexicographer with friends  Substance and Sexual Activity  . Alcohol use: Yes    Alcohol/week: 1.2 oz    Types: 2 Cans of beer per week    Comment: occasional use  . Drug use: No  .  Sexual activity: Not Currently  Lifestyle  . Physical activity:    Days per week: Not on file    Minutes per session: Not on file  . Stress: Only a little  Relationships  . Social connections:    Talks on phone: Not on file    Gets together: Not on file    Attends religious service: Not on file    Active member of club or organization: Not on file    Attends meetings of clubs or organizations: Not on file    Relationship status: Not on file  Other Topics Concern  . Not on file  Social History Narrative  . Not on file    Outpatient Encounter Medications as of 06/02/2018  Medication Sig  . amLODipine (NORVASC) 10 MG tablet TAKE 1 TABLET BY MOUTH DAILY  . Cholecalciferol (VITAMIN D) 2000 units CAPS Take 1 capsule by mouth daily. Reported on 02/07/2016  . clotrimazole-betamethasone (LOTRISONE) cream Apply 1 application topically 2 (two) times daily. (Patient taking differently: Apply 1 application topically 2 (two) times daily. )  . losartan-hydrochlorothiazide (HYZAAR) 100-12.5 MG tablet TAKE 1 TABLET BY MOUTH EVERY DAY  . sertraline (ZOLOFT) 50 MG tablet Take 50 mg by mouth as needed.   . traMADol (ULTRAM) 50 MG tablet Take 1 tablet (50 mg total) by mouth every 12 (twelve) hours as needed for moderate pain.  Marland Kitchen diclofenac (CATAFLAM) 50 MG tablet Take 1 tablet (50 mg total) by mouth 3 (three) times daily as needed. (Patient not taking: Reported on 06/02/2018)  . Multiple Vitamins-Minerals (MULTIVITAMIN WITH MINERALS) tablet Take 1 tablet by mouth daily.    No facility-administered encounter medications on file as of 06/02/2018.     Activities of Daily Living In your present state of health, do you have any difficulty performing the following activities: 06/02/2018  Hearing? Y  Comment Wears a hearing aid in the right ear occasionally.   Vision? Y  Comment Just got new prescription glasses and readers.   Difficulty concentrating or making decisions? Y  Walking or climbing stairs? N    Dressing or bathing? N  Doing errands, shopping? N  Preparing Food and eating ? N  Using the Toilet? N  In the past six months, have you accidently leaked urine? N  Do you have problems with loss of bowel control? N  Managing your Medications? N  Managing your Finances? N  Housekeeping or managing your Housekeeping? N  Some recent data might be hidden    Patient Care Team: Birdie Sons, MD as PCP - General (Family Medicine) Stark Klein Bing Neighbors, NP as Nurse Practitioner (Neurology) Abbie Sons, MD as Consulting Physician (Urology)   Assessment:   This is a routine wellness examination for [place].  Exercise Activities and Dietary  recommendations Current Exercise Habits: Home exercise routine, Type of exercise: strength training/weights;stretching;Other - see comments(rides a stationary bicycle), Time (Minutes): 40, Frequency (Times/Week): 3, Weekly Exercise (Minutes/Week): 120, Intensity: Mild  Goals    . DIET - INCREASE WATER INTAKE     Recommend increasing water intake to 4 glasses a day.        Fall Risk Fall Risk  06/02/2018 05/12/2017 05/12/2017 02/07/2016  Falls in the past year? No No No No   Is the patient's home free of loose throw rugs in walkways, pet beds, electrical cords, etc?   yes      Grab bars in the bathroom? yes      Handrails on the stairs?   yes      Adequate lighting?   yes  Timed Get Up and Go Performed: N/A  Depression Screen PHQ 2/9 Scores 06/02/2018 05/12/2017 05/12/2017 02/07/2016  PHQ - 2 Score 4 1 1  0  PHQ- 9 Score 13 9 - -    Cognitive Function: Pt declines screening today. Pt has seen a neurologist for memory issues in the past.      6CIT Screen 05/12/2017  What Year? 4 points  What month? 3 points  What time? 0 points  Count back from 20 0 points  Months in reverse 4 points  Repeat phrase 10 points  Total Score 21    Immunization History  Administered Date(s) Administered  . Influenza, High Dose Seasonal PF 11/30/2015,  09/09/2016, 09/01/2017  . Influenza-Unspecified 11/28/2013  . Pneumococcal Conjugate-13 02/07/2016  . Pneumococcal Polysaccharide-23 04/30/2010, 05/12/2017  . Tdap 04/30/2010    Qualifies for Shingles Vaccine? Due for Shingles vaccine. Declined my offer to administer today. Education has been provided regarding the importance of this vaccine. Pt has been advised to call her insurance company to determine her out of pocket expense. Advised she may also receive this vaccine at her local pharmacy or Health Dept. Verbalized acceptance and understanding.  Screening Tests Health Maintenance  Topic Date Due  . FOOT EXAM  04/02/1956  . OPHTHALMOLOGY EXAM  04/02/1956  . HEMOGLOBIN A1C  03/02/2018  . INFLUENZA VACCINE  07/29/2018  . TETANUS/TDAP  04/30/2020  . COLONOSCOPY  04/07/2024  . Hepatitis C Screening  Completed  . PNA vac Low Risk Adult  Completed   Cancer Screenings: Lung: Low Dose CT Chest recommended if Age 71-80 years, 30 pack-year currently smoking OR have quit w/in 15years. Patient does not qualify. Colorectal: Up to date  Additional Screenings:  Hepatitis C Screening: Up to date      Plan:  I have personally reviewed and addressed the Medicare Annual Wellness questionnaire and have noted the following in the patient's chart:  A. Medical and social history B. Use of alcohol, tobacco or illicit drugs  C. Current medications and supplements D. Functional ability and status E.  Nutritional status F.  Physical activity G. Advance directives H. List of other physicians I.  Hospitalizations, surgeries, and ER visits in previous 12 months J.  [place] such as hearing and vision if needed, cognitive and depression L. Referrals and appointments - none  In addition, I have reviewed and discussed with patient certain preventive protocols, quality metrics, and best practice recommendations. A written personalized care plan for preventive services as well as general  preventive health recommendations were provided to patient.  See attached scanned questionnaire for additional information.   Signed,  Fabio Neighbors, LPN Nurse Health Advisor   Nurse Recommendations: Pt needs a diabetic foot  exam and Hgb A1c checked at next OV (CPE scheduled for 06/14/18). Will send [facility] to [facility] to retrieve previous eye exam notes and update HM.

## 2018-06-11 ENCOUNTER — Ambulatory Visit
Admission: RE | Admit: 2018-06-11 | Discharge: 2018-06-11 | Disposition: A | Discharge status: Home / Self Care | Payer: Medicare Other | Source: Ambulatory Visit | Attending: Urology | Admitting: Urology

## 2018-06-11 ENCOUNTER — Ambulatory Visit (INDEPENDENT_AMBULATORY_CARE_PROVIDER_SITE_OTHER): Payer: Medicare Other | Admitting: Family Medicine

## 2018-06-11 ENCOUNTER — Encounter: Payer: Self-pay | Admitting: Family Medicine

## 2018-06-11 VITALS — BP 162/108 | HR 56 | Temp 98.6°F | Resp 16 | Ht 73.0 in | Wt 216.0 lb

## 2018-06-11 DIAGNOSIS — N50819 Testicular pain, unspecified: Secondary | ICD-10-CM

## 2018-06-11 DIAGNOSIS — N5082 Scrotal pain: Secondary | ICD-10-CM

## 2018-06-11 DIAGNOSIS — R413 Other amnesia: Secondary | ICD-10-CM

## 2018-06-11 DIAGNOSIS — I10 Essential (primary) hypertension: Secondary | ICD-10-CM

## 2018-06-11 DIAGNOSIS — N433 Hydrocele, unspecified: Secondary | ICD-10-CM | POA: Insufficient documentation

## 2018-06-11 DIAGNOSIS — E782 Mixed hyperlipidemia: Secondary | ICD-10-CM

## 2018-06-11 DIAGNOSIS — E559 Vitamin D deficiency, unspecified: Secondary | ICD-10-CM

## 2018-06-11 DIAGNOSIS — R7303 Prediabetes: Secondary | ICD-10-CM | POA: Diagnosis not present

## 2018-06-11 DIAGNOSIS — N442 Benign cyst of testis: Secondary | ICD-10-CM | POA: Insufficient documentation

## 2018-06-11 DIAGNOSIS — I861 Scrotal varices: Secondary | ICD-10-CM | POA: Diagnosis not present

## 2018-06-11 DIAGNOSIS — N509 Disorder of male genital organs, unspecified: Secondary | ICD-10-CM | POA: Diagnosis not present

## 2018-06-11 MED ORDER — MEMANTINE HCL 5 MG PO TABS: 5.0000 mg | ORAL_TABLET | Freq: Two times a day (BID) | ORAL | 3 refills | Status: DC

## 2018-06-11 MED ORDER — DONEPEZIL HCL 5 MG PO TABS: 5.0000 mg | ORAL_TABLET | Freq: Every day | ORAL | 11 refills | Status: DC

## 2018-06-11 NOTE — Progress Notes (Signed)
Patient: Luke [name] Male    DOB: 29-Dec-1946   72 y.o.   MRN: [id number] Visit Date: 06/11/2018  Today's Provider: Lelon Huh, MD   Chief Complaint  Patient presents with  . Hypertension  . Hyperglycemia  . Hyperlipidemia   Subjective:   Patient saw Luke [name] for AWV on 06/02/2018.  HPI   Hypertension, follow-up:  BP Readings from Last 3 Encounters:  06/11/18 (!) 162/108  06/02/18 132/68  05/20/18 (!) 149/83    He was last seen for hypertension 1 years ago.  BP at that visit was 138/82. Management since that visit includes; no changes.He reports fair compliance with treatment. He is not having side effects.  He is exercising. He is adherent to low salt diet.   Outside blood pressures are not being checked. He is experiencing none.  Patient denies chest pain, chest pressure/discomfort, claudication, dyspnea, exertional chest pressure/discomfort, fatigue, irregular heart beat, lower extremity edema, near-syncope, orthopnea, palpitations, paroxysmal nocturnal dyspnea, syncope and tachypnea.   Cardiovascular risk factors include advanced age (older than 39 for men, 81 for women), dyslipidemia, hypertension and male gender.  Use of agents associated with hypertension: none.   ------------------------------------------------------------------------    Lipid/Cholesterol, Follow-up:   Last seen for this 05/12/2017 .  Management since that visit includes; no changes.  Last Lipid Panel:    Component Value Date/Time   CHOL 177 05/12/2017 1547   TRIG 308 (H) 05/12/2017 1547   HDL 40 05/12/2017 1547   CHOLHDL 4.4 05/12/2017 1547   LDLCALC 75 05/12/2017 1547    He reports fair compliance with treatment. He is not having side effects.   Wt Readings from Last 3 Encounters:  06/11/18 216 lb (98 kg)  06/02/18 215 lb (97.5 kg)  05/20/18 212 lb 9.6 oz (96.4 kg)    ------------------------------------------------------------------------  Prediabetes From  09/11/2017-labs checked, no changes. Hemoglobin A1c 5.8. Patient has not been watching his diet.   Vitamin D deficiency From 09/11/2017-labs checked. Recommended starting otc Vitamin D3 2,000 units qd. Patient reports fair compliance with treatment, good tolerance and good symptom control.  Lab Results  Component Value Date   VD25OH 21.7 (L) 09/02/2017     He continues to have difficulty with his memory which he and his wife both feel are getting worse. He did see neurology in September and diagnosed as alzheimer's. Aricept was increase to 10mg , but he ran out of medication several weeks ago. He states he is not going back to neurology clinic.    No Known Allergies   Current Outpatient Medications:  .  amLODipine (NORVASC) 10 MG tablet, TAKE 1 TABLET BY MOUTH DAILY, Disp: 90 tablet, Rfl: 4 .  Cholecalciferol (VITAMIN D) 2000 units CAPS, Take 1 capsule by mouth daily. Reported on 02/07/2016, Disp: , Rfl:  .  clotrimazole-betamethasone (LOTRISONE) cream, Apply 1 application topically 2 (two) times daily. (Patient taking differently: Apply 1 application topically 2 (two) times daily. ), Disp: 30 g, Rfl: 0 .  diclofenac (CATAFLAM) 50 MG tablet, Take 1 tablet (50 mg total) by mouth 3 (three) times daily as needed., Disp: 60 tablet, Rfl: 3 .  losartan-hydrochlorothiazide (HYZAAR) 100-12.5 MG tablet, TAKE 1 TABLET BY MOUTH EVERY DAY, Disp: 90 tablet, Rfl: 1 .  sertraline (ZOLOFT) 50 MG tablet, Take 50 mg by mouth as needed. , Disp: , Rfl:  .  traMADol (ULTRAM) 50 MG tablet, Take 1 tablet (50 mg total) by mouth every 12 (twelve) hours as needed for moderate pain., Disp:  10 tablet, Rfl: 0  Review of Systems  Constitutional: Negative for appetite change, chills, fatigue and fever.  HENT: Negative for congestion, ear pain, hearing loss, nosebleeds and trouble swallowing.   Eyes: Positive for visual disturbance. Negative for pain.  Respiratory: Negative for cough, chest tightness and shortness of  breath.   Cardiovascular: Negative for chest pain, palpitations and leg swelling.  Gastrointestinal: Negative for abdominal pain, blood in stool, constipation, diarrhea, nausea and vomiting.  Endocrine: Negative for polydipsia, polyphagia and polyuria.  Genitourinary: Positive for penile pain and testicular pain. Negative for dysuria and flank pain.  Musculoskeletal: Negative for arthralgias, back pain, joint swelling, myalgias and neck stiffness.  Skin: Negative for color change, rash and wound.  Neurological: Negative for dizziness, tremors, seizures, speech difficulty, weakness, light-headedness and headaches.  Psychiatric/Behavioral: Positive for confusion and sleep disturbance. Negative for behavioral problems, decreased concentration and dysphoric mood. The patient is not nervous/anxious.   All other systems reviewed and are negative.   Social History   Tobacco Use  . Smoking status: Never Smoker  . Smokeless tobacco: Never Used  . Tobacco comment: smokes a Lexicographer with friends  Substance Use Topics  . Alcohol use: Yes    Alcohol/week: 1.2 oz    Types: 2 Cans of beer per week    Comment: occasional use   Objective:   BP (!) 162/108 (BP Location: Right Arm, Cuff Size: Large)   Pulse (!) 56   Temp 98.6 F (37 C) (Oral)   Resp 16   Ht 6\' 1"  (1.854 m)   Wt 216 lb (98 kg)   SpO2 98% Comment: room air  BMI 28.50 kg/m  Vitals:   06/11/18 0922 06/11/18 0924  BP: (!) 158/100 (!) 162/108  Pulse: (!) 56   Resp: 16   Temp: 98.6 F (37 C)   TempSrc: Oral   SpO2: 98%   Weight: 216 lb (98 kg)   Height: 6\' 1"  (1.854 m)      Physical Exam   General Appearance:    Alert, cooperative, no distress  Eyes:    PERRL, conjunctiva/corneas clear, EOM's intact       Lungs:     Clear to auscultation bilaterally, respirations unlabored  Heart:    Regular rate and rhythm  Neurologic:   Awake, alert, oriented x 3. No apparent focal neurological           defect.            Assessment & Plan:     1. Memory impairment Felt to have alzheimer's per neurologist evaluation in September. Will start back on 5mg  donepezil and and namenda. Recheck in 3 months. He feels the donepezil keeps him up at night so will take in the morning instead of at night. Consider going back up to 10mg  at follow up.   2. Prediabetes  - Hemoglobin A1c  3. Vitamin D deficiency  - VITAMIN D 25 Hydroxy (Vit-D Deficiency, Fractures)  4. Essential hypertension BP up today but usually well controlled Continue current medications.  Reassess at follow up in 3 months.  - Comprehensive metabolic panel  5. Hyperlipidemia, mixed Not currently on statin.  - Comprehensive metabolic panel - Lipid panel - CBC  6. Persistent pain in testicle Is already scheduled for testicular ultrasound later today.        Luke Huh, MD  [facility]

## 2018-06-12 LAB — COMPREHENSIVE METABOLIC PANEL
A/G RATIO: 1.9 (ref 1.2–2.2)
ALT: 6 IU/L (ref 0–44)
AST: 8 IU/L (ref 0–40)
Albumin: 4.5 g/dL (ref 3.5–4.8)
Alkaline Phosphatase: 81 IU/L (ref 39–117)
BUN/Creatinine Ratio: 10 (ref 10–24)
BUN: 14 mg/dL (ref 8–27)
Bilirubin Total: 0.6 mg/dL (ref 0.0–1.2)
CO2: 23 mmol/L (ref 20–29)
Calcium: 9.6 mg/dL (ref 8.6–10.2)
Chloride: 103 mmol/L (ref 96–106)
Creatinine, Ser: 1.45 mg/dL — ABNORMAL HIGH (ref 0.76–1.27)
GFR, EST AFRICAN AMERICAN: 56 mL/min/{1.73_m2} — AB (ref >59)
GFR, EST NON AFRICAN AMERICAN: 48 mL/min/{1.73_m2} — AB (ref >59)
GLOBULIN, TOTAL: 2.4 g/dL (ref 1.5–4.5)
Glucose: 82 mg/dL (ref 65–99)
POTASSIUM: 4.4 mmol/L (ref 3.5–5.2)
SODIUM: 141 mmol/L (ref 134–144)
TOTAL PROTEIN: 6.9 g/dL (ref 6.0–8.5)

## 2018-06-12 LAB — CBC
HEMOGLOBIN: 13.2 g/dL (ref 13.0–17.7)
Hematocrit: 40.8 % (ref 37.5–51.0)
MCH: 21.6 pg — ABNORMAL LOW (ref 26.6–33.0)
MCHC: 32.4 g/dL (ref 31.5–35.7)
MCV: 67 fL — ABNORMAL LOW (ref 79–97)
PLATELETS: 269 10*3/uL (ref 150–450)
RBC: 6.1 x10E6/uL — ABNORMAL HIGH (ref 4.14–5.80)
RDW: 17.3 % — ABNORMAL HIGH (ref 12.3–15.4)
WBC: 5.7 10*3/uL (ref 3.4–10.8)

## 2018-06-12 LAB — LIPID PANEL
CHOL/HDL RATIO: 3.3 ratio (ref 0.0–5.0)
Cholesterol, Total: 154 mg/dL (ref 100–199)
HDL: 46 mg/dL (ref >39)
LDL CALC: 81 mg/dL (ref 0–99)
Triglycerides: 135 mg/dL (ref 0–149)
VLDL Cholesterol Cal: 27 mg/dL (ref 5–40)

## 2018-06-12 LAB — HEMOGLOBIN A1C
ESTIMATED AVERAGE GLUCOSE: 126 mg/dL
HEMOGLOBIN A1C: 6 % — AB (ref 4.8–5.6)

## 2018-06-12 LAB — VITAMIN D 25 HYDROXY (VIT D DEFICIENCY, FRACTURES): VIT D 25 HYDROXY: 38.1 ng/mL (ref 30.0–100.0)

## 2018-06-14 ENCOUNTER — Telehealth: Payer: Self-pay | Admitting: *Deleted

## 2018-06-14 NOTE — Telephone Encounter (Signed)
LMOVM for pt to return call 

## 2018-06-14 NOTE — Telephone Encounter (Signed)
-----   Message from Birdie Sons, MD sent at 06/14/2018  8:00 AM EDT ----- Blood sugar, cholesterol, kidney functions, electrolytes are all normal. Follow up 09-17-2018 as scheduled.

## 2018-06-15 ENCOUNTER — Telehealth: Payer: Self-pay

## 2018-06-15 NOTE — Telephone Encounter (Signed)
Wife advised of results and acknowledges understanding.

## 2018-06-15 NOTE — Telephone Encounter (Signed)
Called pt, no answer. LM for pt informing him of the information below, advised pt to call back for questions or concerns.

## 2018-06-15 NOTE — Telephone Encounter (Signed)
-----   Message from Abbie Sons, MD sent at 06/13/2018 11:27 AM EDT ----- Scrotal ultrasound showed no abnormalities which would be responsible for his scrotal pain.

## 2018-06-26 ENCOUNTER — Encounter: Payer: Self-pay | Admitting: Emergency Medicine

## 2018-06-26 ENCOUNTER — Emergency Department: Payer: Medicare Other

## 2018-06-26 ENCOUNTER — Emergency Department
Admission: EM | Admit: 2018-06-26 | Discharge: 2018-06-26 | Disposition: A | Discharge status: Home / Self Care | Payer: Medicare Other | Attending: Emergency Medicine | Admitting: Emergency Medicine

## 2018-06-26 ENCOUNTER — Other Ambulatory Visit: Payer: Self-pay

## 2018-06-26 DIAGNOSIS — R109 Unspecified abdominal pain: Secondary | ICD-10-CM | POA: Diagnosis not present

## 2018-06-26 DIAGNOSIS — R1030 Lower abdominal pain, unspecified: Secondary | ICD-10-CM | POA: Diagnosis present

## 2018-06-26 DIAGNOSIS — Z79899 Other long term (current) drug therapy: Secondary | ICD-10-CM | POA: Diagnosis not present

## 2018-06-26 DIAGNOSIS — Z8546 Personal history of malignant neoplasm of prostate: Secondary | ICD-10-CM | POA: Diagnosis not present

## 2018-06-26 DIAGNOSIS — N50819 Testicular pain, unspecified: Secondary | ICD-10-CM | POA: Diagnosis not present

## 2018-06-26 DIAGNOSIS — R638 Other symptoms and signs concerning food and fluid intake: Secondary | ICD-10-CM | POA: Insufficient documentation

## 2018-06-26 DIAGNOSIS — I10 Essential (primary) hypertension: Secondary | ICD-10-CM | POA: Diagnosis not present

## 2018-06-26 DIAGNOSIS — I700 Atherosclerosis of aorta: Secondary | ICD-10-CM | POA: Diagnosis not present

## 2018-06-26 DIAGNOSIS — R1084 Generalized abdominal pain: Secondary | ICD-10-CM | POA: Diagnosis not present

## 2018-06-26 LAB — COMPREHENSIVE METABOLIC PANEL
ALBUMIN: 4 g/dL (ref 3.5–5.0)
ALT: 11 U/L (ref 0–44)
AST: 20 U/L (ref 15–41)
Alkaline Phosphatase: 67 U/L (ref 38–126)
Anion gap: 8 (ref 5–15)
BUN: 17 mg/dL (ref 8–23)
CHLORIDE: 107 mmol/L (ref 98–111)
CO2: 23 mmol/L (ref 22–32)
CREATININE: 1.25 mg/dL — AB (ref 0.61–1.24)
Calcium: 9.4 mg/dL (ref 8.9–10.3)
GFR calc non Af Amer: 56 mL/min — ABNORMAL LOW (ref >60)
Glucose, Bld: 115 mg/dL — ABNORMAL HIGH (ref 70–99)
POTASSIUM: 3.6 mmol/L (ref 3.5–5.1)
SODIUM: 138 mmol/L (ref 135–145)
TOTAL PROTEIN: 7.2 g/dL (ref 6.5–8.1)
Total Bilirubin: 1 mg/dL (ref 0.3–1.2)

## 2018-06-26 LAB — URINALYSIS, COMPLETE (UACMP) WITH MICROSCOPIC
BACTERIA UA: NONE SEEN
BILIRUBIN URINE: NEGATIVE
Glucose, UA: NEGATIVE mg/dL
HGB URINE DIPSTICK: NEGATIVE
Ketones, ur: NEGATIVE mg/dL
LEUKOCYTES UA: NEGATIVE
Nitrite: NEGATIVE
PROTEIN: NEGATIVE mg/dL
Specific Gravity, Urine: 1.034 — ABNORMAL HIGH (ref 1.005–1.030)
pH: 5 (ref 5.0–8.0)

## 2018-06-26 LAB — CBC
HCT: 41.6 % (ref 40.0–52.0)
Hemoglobin: 13.5 g/dL (ref 13.0–18.0)
MCH: 21.9 pg — AB (ref 26.0–34.0)
MCHC: 32.4 g/dL (ref 32.0–36.0)
MCV: 67.6 fL — ABNORMAL LOW (ref 80.0–100.0)
PLATELETS: 265 10*3/uL (ref 150–440)
RBC: 6.15 MIL/uL — ABNORMAL HIGH (ref 4.40–5.90)
RDW: 16.2 % — AB (ref 11.5–14.5)
WBC: 5.4 10*3/uL (ref 3.8–10.6)

## 2018-06-26 LAB — LIPASE, BLOOD: LIPASE: 35 U/L (ref 11–51)

## 2018-06-26 MED ORDER — SODIUM CHLORIDE 0.9 % IV BOLUS
1000.0000 mL | Freq: Once | INTRAVENOUS | Status: AC
  Administered 2018-06-26: 1000 mL via INTRAVENOUS

## 2018-06-26 MED ORDER — IOHEXOL 350 MG/ML SOLN: 75.0000 mL | Freq: Once | INTRAVENOUS | Status: DC | PRN

## 2018-06-26 NOTE — ED Provider Notes (Signed)
[facility] Emergency Department Provider Note ____________________________________________   First MD Initiated Contact with Patient 06/26/18 1204     (approximate)  I have reviewed the triage vital signs and the nursing notes.   HISTORY  Chief Complaint Abdominal Pain  HPI Luke [name] is a 72 y.o. male with a history of hypertension, hypogonadism and chronic abdominal and testicular pain who was presented to the emergency department worsening abdominal testicular pain.  He says that he has had this issue ongoing for years.  He says that he has recently had an ultrasound of his testicles through Dr. Dene Gentry office.  He says that he was called with a report that there was no other intervention needed after the ultrasound.  He says that he also had a physical recently and was discharged with what he reports is a clean bill of health.  However, he says that over the past 1 to 2 days he has had worsening pain and crampy lower abdomen that he feels is radiating from the testicles.  He says that over the past weeks to months he is also had a decreased appetite and has not been eating as much as he used to.  Reports that this pain started after he had a "implant" removed to what appears to be his prostate.  Past Medical History:  Diagnosis Date  . Acquired absence of kidney 12/29/1988  . Brachial neuritis 04/02/2009  . Claudication ([place]) 02/06/2016  . 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 Dysphagia 04/02/2009  . GERD (gastroesophageal reflux disease) 02/06/2016   Schatski ring seen on EGD 07-10-11   . History of MRSA infection 11/18/2007  . Hyperlipidemia, mixed 12/29/1998  . Hypertension 04/02/2009  . Hypogonadism male 02/06/2016  . Insomnia 02/06/2016  . LBP (low back pain) 07/13/2013  . Numbness of toes 02/06/2016  . Palpitations 02/06/2016  . Prediabetes 02/08/2016   A1c=6.2 02/08/16   . Prostate cancer ([place]) 2014   Seed Implants  . Single kidney   . Vitamin  D deficiency 02/06/2016    Patient Active Problem List   Diagnosis Date Noted  . Hearing difficulty 05/12/2017  . Memory impairment 10/16/2016  . Inguinal pain 05/12/2016  . Erectile dysfunction after prostate brachytherapy 02/12/2016  . Solitary kidney 02/12/2016  . Prediabetes 02/08/2016  . Claudication ([place]) 02/06/2016  . GERD (gastroesophageal reflux disease) 02/06/2016  . Headache 02/06/2016  . Hypogonadism male 02/06/2016  . Insomnia 02/06/2016  . Numbness of toes 02/06/2016  . Palpitations 02/06/2016  . 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 Vitamin D deficiency 02/06/2016  . LBP (low back pain) 07/13/2013  . Prostate cancer ([place]) 02/01/2010  . Dysphagia 04/02/2009  . Hypertension 04/02/2009  . Brachial neuritis 04/02/2009  . History of MRSA infection 11/18/2007  . Hyperlipidemia, mixed 12/29/1998  . Acquired absence of kidney 12/29/1988    Past Surgical History:  Procedure Laterality Date  . ABI  07/09/2011   Normal; Left= 1.21, Right=1.00. Triphasic waver forms  . Carotid Doppler Ultrasound  10/18/2012   Minimal soft plague both carotids. No hemodynamically significant stenosis  . CHOLECYSTECTOMY  2001  . Lumbar Spine X Ray  11/28/2011   Mild DDD L1- L2. Anterior heigt loss of T12-L1 vertebral bodies  . NEPHRECTOMY Right 2001   done by Dr. Eliberto Ivory for Angiomyolipoma  . PENILE PROSTHESIS  REMOVAL  11/28/2013   Dr. Eliberto Ivory  . PENILE PROSTHESIS IMPLANT  2008  . Photovaporization of prostate with green light laser  01/19/2012  Dr. Rogers Blocker  . [place]  . UPPER GI ENDOSCOPY  07/10/2011   Done by Dr. Sharen Counter. Revealed Schatski rings    Prior to Admission medications   Medication Sig Start Date End Date Taking? Authorizing Provider  amLODipine (NORVASC) 10 MG tablet TAKE 1 TABLET BY MOUTH DAILY 09/01/17  Yes Birdie Sons, MD  Cholecalciferol (VITAMIN D) 2000 units CAPS Take 1 capsule by mouth daily. Reported on 02/07/2016   Yes [provider]  donepezil (ARICEPT) 5 MG tablet Take 1 tablet (5 mg total) by mouth daily. 06/11/18  Yes Birdie Sons, MD  losartan-hydrochlorothiazide [facility]) 100-12.5 MG tablet TAKE 1 TABLET BY MOUTH EVERY DAY 12/08/17  Yes Birdie Sons, MD  memantine (NAMENDA) 5 MG tablet Take 1 tablet (5 mg total) by mouth 2 (two) times daily. 06/11/18  Yes Birdie Sons, MD  sertraline (ZOLOFT) 50 MG tablet Take 50 mg by mouth as needed.    Yes [provider]  clotrimazole-betamethasone (LOTRISONE) cream Apply 1 application topically 2 (two) times daily. Patient taking differently: Apply 1 application topically 2 (two) times daily.  01/25/18   Birdie Sons, MD  diclofenac (CATAFLAM) 50 MG tablet Take 1 tablet (50 mg total) by mouth 3 (three) times daily as needed. Patient not taking: Reported on 06/26/2018 09/01/17   Birdie Sons, MD  traMADol (ULTRAM) 50 MG tablet Take 1 tablet (50 mg total) by mouth every 12 (twelve) hours as needed for moderate pain. Patient not taking: Reported on 06/26/2018 05/20/18   Abbie Sons, MD    Allergies Patient has no known allergies.  Family History  Problem Relation Age of Onset  . Heart attack Mother   . Hypertension Father   . Prostate cancer Neg Hx   . Bladder Cancer Neg Hx   . Kidney cancer Neg Hx     Social History Social History   Tobacco Use  . Smoking status: Never Smoker  . Smokeless tobacco: Never Used  . Tobacco comment: smokes a Lexicographer with friends  Substance Use Topics  . Alcohol use: Yes    Alcohol/week: 1.2 oz    Types: 2 Cans of beer per week    Comment: occasional use  . Drug use: No    Review of Systems  Constitutional: No fever/chills Eyes: No visual changes. ENT: No sore throat. Cardiovascular: Denies chest pain. Respiratory: Denies shortness of breath. Gastrointestinal:No nausea, no vomiting.  No diarrhea.  No constipation. Genitourinary: As above.  Denies burning or frequency with  urination. Musculoskeletal: Negative for back pain. Skin: Negative for rash. Neurological: Negative for headaches, focal weakness or numbness.   ____________________________________________   PHYSICAL EXAM:  VITAL SIGNS: ED Triage Vitals  Enc Vitals Group     BP 06/26/18 1052 (!) 182/101     Pulse Rate 06/26/18 1052 65     Resp 06/26/18 1052 18     Temp 06/26/18 1052 98.5 F (36.9 C)     Temp Source 06/26/18 1052 Oral     SpO2 06/26/18 1052 100 %     Weight 06/26/18 1053 216 lb (98 kg)     Height 06/26/18 1053 6\' 1"  (1.854 m)     Head Circumference --      Peak Flow --      Pain Score 06/26/18 1053 9     Pain Loc --      Pain Edu? --      Excl. in [place]? --  Constitutional: Alert and oriented. Well appearing and in no acute distress. Eyes: Conjunctivae are normal.  Head: Atraumatic. Nose: No congestion/rhinnorhea. Mouth/Throat: Mucous membranes are moist.  Neck: No stridor.   Cardiovascular: Normal rate, regular rhythm. Grossly normal heart sounds.  Good peripheral circulation. Respiratory: Normal respiratory effort.  No retractions. Lungs CTAB. Gastrointestinal: Soft with mild upper abdominal tenderness without rebound or guarding across the upper abdomen.  Also with mild to moderate lower abdominal tenderness across the lower abdomen without rebound or guarding.  No hernia masses identified. No distention. No CVA tenderness. Genitourinary: No swelling to the bilateral testicles or penis.  However, there is tenderness to palpation of the bilateral testicles as well as the spermatic cords and epididymis bilaterally.  No erythema, no induration, no exudate.  Grossly normal-appearing circumcised penis. Musculoskeletal: No lower extremity tenderness nor edema.  No joint effusions. Neurologic:  Normal speech and language. No gross focal neurologic deficits are appreciated. Skin:  Skin is warm, dry and intact. No rash noted. Psychiatric: Mood and affect are normal. Speech and  behavior are normal.  ____________________________________________   LABS (all labs ordered are listed, but only abnormal results are displayed)  Labs Reviewed  COMPREHENSIVE METABOLIC PANEL - Abnormal; Notable for the following components:      Result Value   Glucose, Bld 115 (*)    Creatinine, Ser 1.25 (*)    GFR calc non Af Amer 56 (*)    All other components within normal limits  CBC - Abnormal; Notable for the following components:   RBC 6.15 (*)    MCV 67.6 (*)    MCH 21.9 (*)    RDW 16.2 (*)    All other components within normal limits  URINALYSIS, COMPLETE (UACMP) WITH MICROSCOPIC - Abnormal; Notable for the following components:   Color, Urine AMBER (*)    APPearance CLEAR (*)    Specific Gravity, Urine 1.034 (*)    All other components within normal limits  LIPASE, BLOOD   ____________________________________________  EKG   ____________________________________________  RADIOLOGY  No acute findings on the CT scan of the abdomen and pelvis. ____________________________________________   PROCEDURES  Procedure(s) performed:   Procedures  Critical Care performed:   ____________________________________________   INITIAL IMPRESSION / ASSESSMENT AND PLAN / ED COURSE  Pertinent labs & imaging results that were available during my care of the patient were reviewed by me and considered in my medical decision making (see chart for details).  Differential diagnosis includes, but is not limited to, acute appendicitis, renal colic, testicular torsion, urinary tract infection/pyelonephritis, prostatitis,  epididymitis, diverticulitis, small bowel obstruction or ileus, colitis, abdominal aortic aneurysm, gastroenteritis, hernia, etc. As part of my medical decision making, I reviewed the following data within the electronic MEDICAL RECORD NUMBER Notes from prior ED visits as well as outpatient imaging.  ----------------------------------------- 3:00 PM on  06/26/2018 -----------------------------------------  Patient at this time without distress.  Symptoms ongoing for several years.  Has followed up with urology as well as his primary care doctor.  I offered him follow-up with surgery and he says that he would like to follow-up in the office.  I do not see an emergent/life-threatening issue at this time.  Patient will be discharged home.  We discussed the imaging results as well as the plan and he is willing to comply.  Testicular pain is chronic.  Did not think there is an acute testicular issue such as torsion or inflammation at this time. ____________________________________________   FINAL CLINICAL IMPRESSION(S) / ED  DIAGNOSES  Abdominal pain.  Testicular pain.    NEW MEDICATIONS STARTED DURING THIS VISIT:  New Prescriptions   No medications on file     Note:  This document was prepared using Dragon voice recognition software and may include unintentional dictation errors.     Orbie Pyo, MD 06/26/18 [phone]

## 2018-06-26 NOTE — ED Triage Notes (Signed)
R lower abdominal pain. Can not states how long he has had it but states has been worked up for it before.

## 2018-07-04 ENCOUNTER — Other Ambulatory Visit: Payer: Self-pay | Admitting: Family Medicine

## 2018-07-04 DIAGNOSIS — R413 Other amnesia: Secondary | ICD-10-CM

## 2018-07-15 DIAGNOSIS — N5201 Erectile dysfunction due to arterial insufficiency: Secondary | ICD-10-CM | POA: Diagnosis not present

## 2018-07-15 DIAGNOSIS — C61 Malignant neoplasm of prostate: Secondary | ICD-10-CM | POA: Diagnosis not present

## 2018-07-15 DIAGNOSIS — D400 Neoplasm of uncertain behavior of prostate: Secondary | ICD-10-CM | POA: Diagnosis not present

## 2018-07-15 DIAGNOSIS — Z85528 Personal history of other malignant neoplasm of kidney: Secondary | ICD-10-CM | POA: Diagnosis not present

## 2018-09-17 ENCOUNTER — Ambulatory Visit (INDEPENDENT_AMBULATORY_CARE_PROVIDER_SITE_OTHER): Payer: Medicare Other | Admitting: Family Medicine

## 2018-09-17 ENCOUNTER — Encounter: Payer: Self-pay | Admitting: Family Medicine

## 2018-09-17 VITALS — BP 158/90 | HR 76 | Temp 98.5°F | Resp 16 | Wt 213.0 lb

## 2018-09-17 DIAGNOSIS — Z23 Encounter for immunization: Secondary | ICD-10-CM

## 2018-09-17 DIAGNOSIS — F0390 Unspecified dementia without behavioral disturbance: Secondary | ICD-10-CM | POA: Diagnosis not present

## 2018-09-17 DIAGNOSIS — R102 Pelvic and perineal pain unspecified side: Secondary | ICD-10-CM

## 2018-09-17 DIAGNOSIS — G4700 Insomnia, unspecified: Secondary | ICD-10-CM | POA: Diagnosis not present

## 2018-09-17 DIAGNOSIS — I10 Essential (primary) hypertension: Secondary | ICD-10-CM | POA: Diagnosis not present

## 2018-09-17 MED ORDER — AMLODIPINE BESYLATE 10 MG PO TABS: 10.0000 mg | ORAL_TABLET | Freq: Every day | ORAL | 4 refills | Status: DC

## 2018-09-17 MED ORDER — TRAZODONE HCL 100 MG PO TABS
50.0000 mg | ORAL_TABLET | Freq: Every evening | ORAL | 3 refills | Status: DC | PRN
Start: 2018-09-17 — End: 2018-10-10

## 2018-09-17 MED ORDER — DONEPEZIL HCL 5 MG PO TABS: 10.0000 mg | ORAL_TABLET | Freq: Every day | ORAL | 0 refills | Status: DC

## 2018-09-17 MED ORDER — MEMANTINE HCL 5 MG PO TABS: 5.0000 mg | ORAL_TABLET | Freq: Two times a day (BID) | ORAL | 4 refills | Status: DC

## 2018-09-17 NOTE — Patient Instructions (Addendum)
Take 3 ibuprofen as needed for pelvic pain.

## 2018-09-17 NOTE — Progress Notes (Signed)
Patient: Luke [name] Male    DOB: 1946/05/19   72 y.o.   MRN: [id number] Visit Date: 09/17/2018  Today's Provider: Lelon Huh, MD   Chief Complaint  Patient presents with  . Hypertension  . Dementia  . Insomnia   Subjective:    HPI   Hypertension, follow-up:  BP Readings from Last 3 Encounters:  09/17/18 (!) 158/90  06/26/18 (!) 183/99  06/11/18 (!) 162/108    He was last seen for hypertension 2 months ago.  BP at that visit was 158/100. Management since that visit includes; no changes. Will reassess in 3 months.He reports fair compliance with treatment. Patients wife states he ran out of one of his blood pressure medication 1 month ago.  He is not having side effects.  He is not exercising. He is not adherent to low salt diet.   Outside blood pressures are not being checked. He is experiencing none.  Patient denies chest pain, chest pressure/discomfort, claudication, dyspnea, exertional chest pressure/discomfort, fatigue, irregular heart beat, lower extremity edema, near-syncope, orthopnea, palpitations, paroxysmal nocturnal dyspnea, syncope and tachypnea.   Cardiovascular risk factors include advanced age (older than 60 for men, 14 for women), hypertension and male gender.  Use of agents associated with hypertension: none.   ------------------------------------------------------------------------  Memory impairment From 06/11/2018-started back on 5mg  donepezil and and namenda. Recheck in 3 months. He feels the donepezil keeps him up at night so will take in the morning instead of at night. Consider going back up to 10mg  at follow up. Patients  Wife reports fair compliance with treatment. She states he ran out of Namenda about 1 week ago. She also reports this problem is unchanged since the last visit. Has had some issues with wandering.  Was previously evaluated at [facility]. Neurology in September 2018 and though to have late onset Alzheimers. Was advised to follow  up in 3 months, but patient has not been willing to return.   The patient is most concerned about possible penile deformity and intermittent episodes of pelvic pain. He saw Dr. Bernardo Heater for this in June, and Dr. Rogers Blocker for a second opinion on July 18th. Patient states Dr. Rogers Blocker told him his penis was broken, but doesn't recall details. He does have history of penile implant which was extracted after treatment for prostate cancer with seed implants several years ago. His wife was not able to accompany the patient to [facility] office and would like to know what he thought needed to be done.    No Known Allergies   Current Outpatient Medications:  .  Cholecalciferol (VITAMIN D) 2000 units CAPS, Take 1 capsule by mouth daily. Reported on 02/07/2016, Disp: , Rfl:  .  donepezil (ARICEPT) 5 MG tablet, Take 1 tablet (5 mg total) by mouth daily., Disp: 90 tablet, Rfl: 11 .  losartan-hydrochlorothiazide (HYZAAR) 100-12.5 MG tablet, TAKE 1 TABLET BY MOUTH EVERY DAY, Disp: 90 tablet, Rfl: 1 .  sertraline (ZOLOFT) 50 MG tablet, Take 50 mg by mouth as needed. , Disp: , Rfl:  .  traMADol (ULTRAM) 50 MG tablet, Take 1 tablet (50 mg total) by mouth every 12 (twelve) hours as needed for moderate pain., Disp: 10 tablet, Rfl: 0 .  amLODipine (NORVASC) 10 MG tablet, TAKE 1 TABLET BY MOUTH DAILY (Patient not taking: Reported on 09/17/2018), Disp: 90 tablet, Rfl: 4 .  clotrimazole-betamethasone (LOTRISONE) cream, Apply 1 application topically 2 (two) times daily. (Patient not taking: Reported on 09/17/2018), Disp: 30 g, Rfl: 0 .  diclofenac (CATAFLAM) 50 MG tablet, Take 1 tablet (50 mg total) by mouth 3 (three) times daily as needed. (Patient not taking: Reported on 06/26/2018), Disp: 60 tablet, Rfl: 3 .  memantine (NAMENDA) 5 MG tablet, TAKE 1 TABLET BY MOUTH TWICE A DAY (Patient not taking: Reported on 09/17/2018), Disp: 180 tablet, Rfl: 2  Review of Systems  Constitutional: Negative for appetite change, chills and fever.    Respiratory: Negative for chest tightness, shortness of breath and wheezing.   Cardiovascular: Negative for chest pain and palpitations.  Gastrointestinal: Negative for abdominal pain, nausea and vomiting.  Genitourinary:       Groin pain    Social History   Tobacco Use  . Smoking status: Never Smoker  . Smokeless tobacco: Never Used  . Tobacco comment: smokes a Lexicographer with friends  Substance Use Topics  . Alcohol use: Yes    Alcohol/week: 2.0 standard drinks    Types: 2 Cans of beer per week    Comment: occasional use   Objective:   BP (!) 158/90 (BP Location: Left Arm, Patient Position: Sitting, Cuff Size: Large)   Pulse 76   Temp 98.5 F (36.9 C) (Oral)   Resp 16   Wt 213 lb (96.6 kg)   SpO2 98% Comment: room air  BMI 28.10 kg/m  Vitals:   09/17/18 1112  BP: (!) 158/90  Pulse: 76  Resp: 16  Temp: 98.5 F (36.9 C)  TempSrc: Oral  SpO2: 98%  Weight: 213 lb (96.6 kg)     Physical Exam  General appearance: alert, well developed, well nourished, cooperative and in no distress Head: Normocephalic, without obvious abnormality, atraumatic Respiratory: Respirations even and unlabored, normal respiratory rate Extremities: No gross deformities Skin: Skin color, texture, turgor normal. No rashes seen  Psych: Appropriate mood and affect. Neurologic: Mental status: Alert, oriented to person, and place, thought content appropriate.     Assessment & Plan:     1. Dementia without behavioral disturbance, unspecified dementia type Increase - donepezil (ARICEPT) 5 MG tablet; to 2 tablets (10 mg total) by mouth daily.  Dispense: 3 tablet; Refill: 0 Get back on - memantine (NAMENDA) 5 MG tablet; Take 1 tablet (5 mg total) by mouth 2 (two) times daily.  Dispense: 180 tablet; Refill: 4  Patient has been resistant to going back to neurology. Counseled today we will need to refer back if not significantly improved at follow up.   2. Pelvic pain No clear  etiology. Unremarkable urology work up. He does have history of prostate cancer, seed implants, and extracted penile implant. His wife is asking what Dr. Eliberto Ivory recommended since patient cannot remember. Will send for records from 07-16-2018 visit. In the meantime can take prn OTC ibuprofen.   3. Need for influenza vaccination  - Flu vaccine HIGH DOSE PF (Fluzone High dose)  4. Essential hypertension Currently out of amlodipine which is refilled today.   5. Insomnia, unspecified type This is likely secondary to his dementia rather than side effect of medication. Will try trazodone 100mg  1/2 =1 qhs and increase Aricept as above.   Follow up in 3 months.         Lelon Huh, MD  [facility]

## 2018-09-28 ENCOUNTER — Telehealth: Payer: Self-pay | Admitting: Family Medicine

## 2018-09-28 NOTE — Telephone Encounter (Signed)
Pt's wife called wanting to know if we have gotten in touch Dr Rogers Blocker in urology yet.  They are waiting to hear back from [place] if Dr. Caryn Section has spoken with Dr. Rogers Blocker about Mr. Schirmer's last visit.  Pt' CB# [phone]  Thanks Con Memos

## 2018-09-28 NOTE — Telephone Encounter (Signed)
Please advise 

## 2018-09-29 NOTE — Telephone Encounter (Signed)
Dr. Yves Dill just checked his testosterone and PSA levels, which were normal, and prescribed a new vacuum device to try for ED.  (patient said that Dr. Yves Dill told him he had broken penis, but there is no mention of anything like that in Dr. Letta Kocher notes) Dr. Yves Dill wanted Mr. Kenton to follow up in six months, which would be in [facility] January.

## 2018-09-29 NOTE — Telephone Encounter (Signed)
Mrs. Millirons advised.   Thanks,   -Mickel Baas

## 2018-10-10 ENCOUNTER — Other Ambulatory Visit: Payer: Self-pay | Admitting: Family Medicine

## 2018-10-10 DIAGNOSIS — G4700 Insomnia, unspecified: Secondary | ICD-10-CM

## 2018-10-28 DIAGNOSIS — N5201 Erectile dysfunction due to arterial insufficiency: Secondary | ICD-10-CM | POA: Diagnosis not present

## 2018-10-28 DIAGNOSIS — C61 Malignant neoplasm of prostate: Secondary | ICD-10-CM | POA: Diagnosis not present

## 2018-10-28 DIAGNOSIS — D400 Neoplasm of uncertain behavior of prostate: Secondary | ICD-10-CM | POA: Diagnosis not present

## 2018-12-15 ENCOUNTER — Other Ambulatory Visit: Payer: Self-pay | Admitting: Family Medicine

## 2018-12-17 ENCOUNTER — Encounter: Payer: Self-pay | Admitting: Family Medicine

## 2018-12-17 ENCOUNTER — Ambulatory Visit (INDEPENDENT_AMBULATORY_CARE_PROVIDER_SITE_OTHER): Payer: Medicare Other | Admitting: Family Medicine

## 2018-12-17 VITALS — BP 140/82 | HR 56 | Temp 98.8°F | Resp 16 | Wt 215.0 lb

## 2018-12-17 DIAGNOSIS — R7303 Prediabetes: Secondary | ICD-10-CM

## 2018-12-17 DIAGNOSIS — R200 Anesthesia of skin: Secondary | ICD-10-CM | POA: Diagnosis not present

## 2018-12-17 DIAGNOSIS — F0390 Unspecified dementia without behavioral disturbance: Secondary | ICD-10-CM

## 2018-12-17 DIAGNOSIS — G4700 Insomnia, unspecified: Secondary | ICD-10-CM | POA: Diagnosis not present

## 2018-12-17 LAB — POCT GLYCOSYLATED HEMOGLOBIN (HGB A1C)
ESTIMATED AVERAGE GLUCOSE: 126
HEMOGLOBIN A1C: 6 % — AB (ref 4.0–5.6)

## 2018-12-17 MED ORDER — RIVASTIGMINE TARTRATE 1.5 MG PO CAPS: 1.5000 mg | ORAL_CAPSULE | Freq: Two times a day (BID) | ORAL | 4 refills | Status: DC

## 2018-12-17 NOTE — Progress Notes (Signed)
Patient: Luke [name] Male    DOB: [birth year]   72 y.o.   MRN: [id number] Visit Date: 12/17/2018  Today's Provider: Lelon Huh, MD   Chief Complaint  Patient presents with  . Dementia  . Hypertension  . Insomnia   Subjective:     HPI  Dementia: Patient was last seen for this problem 3 months ago. Changes made during that visit includes increasing Aricept to 10mg  daily. Namenda was also restarted. Today patients wife comes in reporting that she stopping giving patient the Aricept, due to it causing Hallucinations. He is also not taking Namenda. Patient's wife reports his memory problems are unchanged since the last visit.    Hypertension, follow-up:  BP Readings from Last 3 Encounters:  12/17/18 140/82  09/17/18 (!) 158/90  06/26/18 (!) 183/99    He was last seen for hypertension 3 months ago.  BP at that visit was 158/0. Management since that visit includes no changes. He reports good compliance with treatment. He is not having side effects.  He is not exercising. He is adherent to low salt diet.   Outside blood pressures are not being checked. He is experiencing none.  Patient denies chest pain, chest pressure/discomfort, claudication, dyspnea, exertional chest pressure/discomfort, fatigue, irregular heart beat, lower extremity edema, near-syncope, orthopnea, palpitations, paroxysmal nocturnal dyspnea, syncope and tachypnea.   Cardiovascular risk factors include advanced age (older than 19 for men, 59 for women), hypertension and male gender.  Use of agents associated with hypertension: none.     Weight trend: fluctuating a bit Wt Readings from Last 3 Encounters:  12/17/18 215 lb (97.5 kg)  09/17/18 213 lb (96.6 kg)  06/26/18 216 lb (98 kg)    Current diet: in general, an "unhealthy" diet  ------------------------------------------------------------------------ Insomnia: Patient was last seen for this problem 3 months ago. Management during that  visit includes starting Trazodone 100mg  . Aricept was also increased. His wife states that since Aricept and Namenda were discontinued that he has been sleeping better and has not been taking trazodone.    No Known Allergies   Current Outpatient Medications:  .  amLODipine (NORVASC) 10 MG tablet, Take 1 tablet (10 mg total) by mouth daily., Disp: 90 tablet, Rfl: 4 .  losartan-hydrochlorothiazide (HYZAAR) 100-12.5 MG tablet, TAKE 1 TABLET BY MOUTH EVERY DAY, Disp: 90 tablet, Rfl: 4 .  Multiple Vitamin (MULTIVITAMIN) tablet, Take 1 tablet by mouth daily., Disp: , Rfl:  .  sertraline (ZOLOFT) 50 MG tablet, Take 50 mg by mouth as needed. , Disp: , Rfl:  .  Cholecalciferol (VITAMIN D) 2000 units CAPS, Take 1 capsule by mouth daily. Reported on 02/07/2016, Disp: , Rfl:  .  donepezil (ARICEPT) 5 MG tablet, Take 2 tablets (10 mg total) by mouth daily. (Patient not taking: Reported on 12/17/2018), Disp: 3 tablet, Rfl: 0 .  memantine (NAMENDA) 5 MG tablet, Take 1 tablet (5 mg total) by mouth 2 (two) times daily. (Patient not taking: Reported on 12/17/2018), Disp: 180 tablet, Rfl: 4 .  traMADol (ULTRAM) 50 MG tablet, Take 1 tablet (50 mg total) by mouth every 12 (twelve) hours as needed for moderate pain. (Patient not taking: Reported on 12/17/2018), Disp: 10 tablet, Rfl: 0 .  traZODone (DESYREL) 100 MG tablet, TAKE 0.5-1 TABLETS (50-100 MG TOTAL) BY MOUTH AT BEDTIME AS NEEDED FOR SLEEP. (Patient not taking: Reported on 12/17/2018), Disp: 90 tablet, Rfl: 4  Review of Systems  Constitutional: Negative for appetite change, chills and fever.  Respiratory: Negative for chest tightness, shortness of breath and wheezing.   Cardiovascular: Negative for chest pain and palpitations.  Gastrointestinal: Negative for abdominal pain, nausea and vomiting.  Psychiatric/Behavioral: Positive for sleep disturbance.    Social History   Tobacco Use  . Smoking status: Never Smoker  . Smokeless tobacco: Never Used  .  Tobacco comment: smokes a Lexicographer with friends  Substance Use Topics  . Alcohol use: Yes    Alcohol/week: 2.0 standard drinks    Types: 2 Cans of beer per week    Comment: occasional use      Objective:   BP 140/82 (BP Location: Left Arm, Patient Position: Sitting, Cuff Size: Large)   Pulse (!) 56   Temp 98.8 F (37.1 C) (Oral)   Resp 16   Wt 215 lb (97.5 kg)   SpO2 97% Comment: room air  BMI 28.37 kg/m  Vitals:   12/17/18 0950  BP: 140/82  Pulse: (!) 56  Resp: 16  Temp: 98.8 F (37.1 C)  TempSrc: Oral  SpO2: 97%  Weight: 215 lb (97.5 kg)     Physical Exam   General Appearance:    Alert, cooperative, no distress  Eyes:    PERRL, conjunctiva/corneas clear, EOM's intact       Lungs:     Clear to auscultation bilaterally, respirations unlabored  Heart:    Regular rate and rhythm  Neurologic:   Awake, alert, oriented x 3. No apparent focal neurological           defect.       Results for orders placed or performed in visit on 12/17/18  POCT HgB A1C  Result Value Ref Range   Hemoglobin A1C 6.0 (A) 4.0 - 5.6 %   HbA1c POC (<> result, manual entry)     HbA1c, POC (prediabetic range)     HbA1c, POC (controlled diabetic range)     Est. average glucose Bld gHb Est-mCnc 126        Assessment & Plan    1. Prediabetes Stable. Check a1c twice a year.  - POCT HgB A1C  2. Dementia without behavioral disturbance, unspecified dementia type ([place]) Off of Aricept and Namenda due to hallucinations and insomnia, which have since resolved. Discussed options for treatment and potential adverse effects of other medications, Will try low dose of Exelon. If tolerating well consider increase dose at follow up.   3. Numbness of toes No with some burning on the bottoms of his feet, likely due to neuropathy. Consider referral to podiatry and neurology if sx progress.   4. Insomnia, unspecified type Better since stopping Aricept and Namenda. No need to take trazodone  anymore.      Lelon Huh, MD  [facility]

## 2018-12-17 NOTE — Patient Instructions (Signed)
.   PLEASE BRING ALL OF YOUR MEDICATIONS TO EVERY APPOINTMENT TO MAKE SURE OUR MEDICATION LIST IS THE SAME AS YOURS   

## 2019-01-12 ENCOUNTER — Other Ambulatory Visit: Payer: Self-pay | Admitting: Family Medicine

## 2019-01-12 MED ORDER — HYDROCHLOROTHIAZIDE 12.5 MG PO TABS: 12.5000 mg | ORAL_TABLET | Freq: Every day | ORAL | 3 refills | Status: DC

## 2019-01-12 NOTE — Telephone Encounter (Signed)
Please review

## 2019-03-01 ENCOUNTER — Emergency Department: Payer: PPO

## 2019-03-01 ENCOUNTER — Emergency Department
Admission: EM | Admit: 2019-03-01 | Discharge: 2019-03-01 | Disposition: A | Discharge status: Home / Self Care | Payer: PPO | Attending: Student in an Organized Health Care Education/Training Program | Admitting: Student in an Organized Health Care Education/Training Program

## 2019-03-01 ENCOUNTER — Other Ambulatory Visit: Payer: Self-pay

## 2019-03-01 DIAGNOSIS — F0390 Unspecified dementia without behavioral disturbance: Secondary | ICD-10-CM | POA: Diagnosis not present

## 2019-03-01 DIAGNOSIS — Z9049 Acquired absence of other specified parts of digestive tract: Secondary | ICD-10-CM | POA: Diagnosis not present

## 2019-03-01 DIAGNOSIS — Z79899 Other long term (current) drug therapy: Secondary | ICD-10-CM | POA: Diagnosis not present

## 2019-03-01 DIAGNOSIS — R079 Chest pain, unspecified: Secondary | ICD-10-CM | POA: Diagnosis not present

## 2019-03-01 DIAGNOSIS — R0789 Other chest pain: Secondary | ICD-10-CM

## 2019-03-01 DIAGNOSIS — I10 Essential (primary) hypertension: Secondary | ICD-10-CM | POA: Insufficient documentation

## 2019-03-01 DIAGNOSIS — R0602 Shortness of breath: Secondary | ICD-10-CM | POA: Diagnosis not present

## 2019-03-01 DIAGNOSIS — R42 Dizziness and giddiness: Secondary | ICD-10-CM | POA: Diagnosis not present

## 2019-03-01 LAB — BASIC METABOLIC PANEL
Anion gap: 8 (ref 5–15)
BUN: 17 mg/dL (ref 8–23)
CALCIUM: 9.4 mg/dL (ref 8.9–10.3)
CO2: 24 mmol/L (ref 22–32)
CREATININE: 1.2 mg/dL (ref 0.61–1.24)
Chloride: 107 mmol/L (ref 98–111)
GFR calc Af Amer: >60 mL/min (ref >60)
Glucose, Bld: 101 mg/dL — ABNORMAL HIGH (ref 70–99)
Potassium: 4 mmol/L (ref 3.5–5.1)
SODIUM: 139 mmol/L (ref 135–145)

## 2019-03-01 LAB — TROPONIN I: Troponin I: <0.03 ng/mL (ref <0.03)

## 2019-03-01 LAB — CBC
HCT: 40.1 % (ref 39.0–52.0)
HEMOGLOBIN: 12.7 g/dL — AB (ref 13.0–17.0)
MCH: 22 pg — AB (ref 26.0–34.0)
MCHC: 31.7 g/dL (ref 30.0–36.0)
MCV: 69.6 fL — ABNORMAL LOW (ref 80.0–100.0)
PLATELETS: 298 10*3/uL (ref 150–400)
RBC: 5.76 MIL/uL (ref 4.22–5.81)
RDW: 16.6 % — AB (ref 11.5–15.5)
WBC: 6.5 10*3/uL (ref 4.0–10.5)
nRBC: 0 % (ref 0.0–0.2)

## 2019-03-01 NOTE — ED Notes (Signed)
Dispo signature hard copy in chart d/t no signature pad

## 2019-03-01 NOTE — ED Triage Notes (Signed)
C/o chest pain, sob and dizziness that began approx 2 hours ago while at home sitting down.  Pt alert and oriented X4, active, cooperative, pt in NAD. RR even and unlabored, color WNL.  Pt able to ambulate no stroke sx present at this time.

## 2019-03-01 NOTE — ED Provider Notes (Signed)
[facility] Emergency Department Provider Note    First MD Initiated Contact with Patient 03/01/19 2034     (approximate)  I have reviewed the triage vital signs and the nursing notes.   HISTORY  Chief Complaint Chest Pain; Shortness of Breath; and Dizziness    HPI Luke [name] is a 73 y.o. male presents the ER for chest pain shortness of breath lightheadedness for the patient was walking around the block this evening.  Is currently pain-free.  Denies any shortness of breath previously.  Denies any history of CAD.  Does have a history of high blood pressure.  He is accompanied by his wife he states that he is developing Alzheimer's.  He denies any nausea or vomiting.  No recent fevers.  They have noticed that he is been having some congestion particular at night.    Past Medical History:  Diagnosis Date  . Acquired absence of kidney 12/29/1988  . Brachial neuritis 04/02/2009  . Claudication ([place]) 02/06/2016  . 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 Dysphagia 04/02/2009  . GERD (gastroesophageal reflux disease) 02/06/2016   Schatski ring seen on EGD 07-10-11   . History of MRSA infection 11/18/2007  . Hyperlipidemia, mixed 12/29/1998  . Hypertension 04/02/2009  . Hypogonadism male 02/06/2016  . Insomnia 02/06/2016  . LBP (low back pain) 07/13/2013  . Numbness of toes 02/06/2016  . Palpitations 02/06/2016  . Prediabetes 02/08/2016   A1c=6.2 02/08/16   . Prostate cancer ([place]) 2014   Seed Implants  . Single kidney   . Vitamin D deficiency 02/06/2016   Family History  Problem Relation Age of Onset  . Heart attack Mother   . Hypertension Father   . Prostate cancer Neg Hx   . Bladder Cancer Neg Hx   . Kidney cancer Neg Hx    Past Surgical History:  Procedure Laterality Date  . ABI  07/09/2011   Normal; Left= 1.21, Right=1.00. Triphasic waver forms  . Carotid Doppler Ultrasound  10/18/2012   Minimal soft plague both carotids. No hemodynamically  significant stenosis  . CHOLECYSTECTOMY  2001  . Lumbar Spine X Ray  11/28/2011   Mild DDD L1- L2. Anterior heigt loss of T12-L1 vertebral bodies  . NEPHRECTOMY Right 2001   done by Dr. Eliberto Ivory for Angiomyolipoma  . PENILE PROSTHESIS  REMOVAL  11/28/2013   Dr. Eliberto Ivory  . PENILE PROSTHESIS IMPLANT  2008  . Photovaporization of prostate with green light laser  01/19/2012   Dr. Rogers Blocker  . [place]  . UPPER GI ENDOSCOPY  07/10/2011   Done by Dr. Sharen Counter. Revealed Schatski rings   Patient Active Problem List   Diagnosis Date Noted  . Hearing difficulty 05/12/2017  . Dementia ([place]) 10/16/2016  . Inguinal pain 05/12/2016  . Erectile dysfunction after prostate brachytherapy 02/12/2016  . Solitary kidney 02/12/2016  . Prediabetes 02/08/2016  . GERD (gastroesophageal reflux disease) 02/06/2016  . Headache 02/06/2016  . Hypogonadism male 02/06/2016  . Insomnia 02/06/2016  . Numbness of toes 02/06/2016  . Palpitations 02/06/2016  . 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 Vitamin D deficiency 02/06/2016  . LBP (low back pain) 07/13/2013  . Dysphagia 04/02/2009  . Hypertension 04/02/2009  . Brachial neuritis 04/02/2009  . History of MRSA infection 11/18/2007  . Hyperlipidemia, mixed 12/29/1998  . Acquired absence of kidney 12/29/1988      Prior to Admission medications   Medication Sig Start Date End Date Taking? Authorizing Provider  amLODipine (  NORVASC) 10 MG tablet Take 1 tablet (10 mg total) by mouth daily. 09/17/18  Yes Birdie Sons, MD  Cholecalciferol (VITAMIN D) 2000 units CAPS Take 1 capsule by mouth daily. Reported on 02/07/2016   Yes [provider]  losartan-hydrochlorothiazide (HYZAAR) 100-12.5 MG tablet Take 1 tablet by mouth daily.   Yes [provider]  Multiple Vitamin (MULTIVITAMIN) tablet Take 1 tablet by mouth daily.   Yes [provider]  rivastigmine (EXELON) 1.5 MG capsule Take 1 capsule (1.5 mg total) by mouth 2  (two) times daily. 12/17/18  Yes Birdie Sons, MD  sertraline (ZOLOFT) 50 MG tablet Take 50 mg by mouth as needed.    Yes [provider]  traMADol (ULTRAM) 50 MG tablet Take 1 tablet (50 mg total) by mouth every 12 (twelve) hours as needed for moderate pain. 05/20/18  Yes Stoioff, Ronda Fairly, MD  hydrochlorothiazide (HYDRODIURIL) 12.5 MG tablet Take 1 tablet (12.5 mg total) by mouth daily. Patient not taking: Reported on 03/01/2019 01/12/19   Birdie Sons, MD  losartan (COZAAR) 100 MG tablet Please specify directions, refills and quantity Patient not taking: Reported on 03/01/2019 01/12/19   Birdie Sons, MD  sildenafil (VIAGRA) 100 MG tablet Take 100 mg by mouth as directed. 02/15/19   [provider]    Allergies Patient has no known allergies.    Social History Social History   Tobacco Use  . Smoking status: Never Smoker  . Smokeless tobacco: Never Used  . Tobacco comment: smokes a Lexicographer with friends  Substance Use Topics  . Alcohol use: Yes    Alcohol/week: 2.0 standard drinks    Types: 2 Cans of beer per week    Comment: occasional use  . Drug use: No    Review of Systems Patient denies headaches, rhinorrhea, blurry vision, numbness, shortness of breath, chest pain, edema, cough, abdominal pain, nausea, vomiting, diarrhea, dysuria, fevers, rashes or hallucinations unless otherwise stated above in HPI. ____________________________________________   PHYSICAL EXAM:  VITAL SIGNS: Vitals:   03/01/19 2034 03/01/19 2237  BP: (!) 160/100 (!) 173/97  Pulse: (!) 58 (!) 54  Resp: 18 17  Temp:    SpO2: 100% 100%    Constitutional: Alert and oriented.  Eyes: Conjunctivae are normal.  Head: Atraumatic. Nose: No congestion/rhinnorhea. Mouth/Throat: Mucous membranes are moist.   Neck: No stridor. Painless ROM.  Cardiovascular: Normal rate, regular rhythm. Grossly normal heart sounds.  Good peripheral circulation. Respiratory: Normal  respiratory effort.  No retractions. Lungs CTAB. Gastrointestinal: Soft and nontender. No distention. No abdominal bruits. No CVA tenderness. Genitourinary:  Musculoskeletal: No lower extremity tenderness nor edema.  No joint effusions. Neurologic:  Normal speech and language. No gross focal neurologic deficits are appreciated. No facial droop Skin:  Skin is warm, dry and intact. No rash noted. Psychiatric: Mood and affect are normal. Speech and behavior are normal.  ____________________________________________   LABS (all labs ordered are listed, but only abnormal results are displayed)  Results for orders placed or performed during the hospital encounter of 03/01/19 (from the past 24 hour(s))  Basic metabolic panel     Status: Abnormal   Collection Time: 03/01/19  4:18 PM  Result Value Ref Range   Sodium 139 135 - 145 mmol/L   Potassium 4.0 3.5 - 5.1 mmol/L   Chloride 107 98 - 111 mmol/L   CO2 24 22 - 32 mmol/L   Glucose, Bld 101 (H) 70 - 99 mg/dL   BUN 17 8 -  23 mg/dL   Creatinine, Ser 1.20 0.61 - 1.24 mg/dL   Calcium 9.4 8.9 - 10.3 mg/dL   GFR calc non Af Amer >60 >60 mL/min   GFR calc Af Amer >60 >60 mL/min   Anion gap 8 5 - 15  CBC     Status: Abnormal   Collection Time: 03/01/19  4:18 PM  Result Value Ref Range   WBC 6.5 4.0 - 10.5 K/uL   RBC 5.76 4.22 - 5.81 MIL/uL   Hemoglobin 12.7 (L) 13.0 - 17.0 g/dL   HCT 40.1 39.0 - 52.0 %   MCV 69.6 (L) 80.0 - 100.0 fL   MCH 22.0 (L) 26.0 - 34.0 pg   MCHC 31.7 30.0 - 36.0 g/dL   RDW 16.6 (H) 11.5 - 15.5 %   Platelets 298 150 - 400 K/uL   nRBC 0.0 0.0 - 0.2 %  Troponin I - ONCE - STAT     Status: None   Collection Time: 03/01/19  4:18 PM  Result Value Ref Range   Troponin I <0.03 <0.03 ng/mL  Troponin I - ONCE - STAT     Status: None   Collection Time: 03/01/19  9:37 PM  Result Value Ref Range   Troponin I <0.03 <0.03 ng/mL   ____________________________________________  EKG  My review and personal interpretation at  Time: 16:18   Indication: chest pain  Rate: 60  Rhythm: sinus Axis: normal Other: normal intervals, occasional pac, nonspecific st abnml likely motion artifact in 1 and avl as there are no reciprocal changes   My review and personal interpretation at Time: 16:22   Indication: chest pain  Rate: 60  Rhythm: sinus Axis: normal Other: normal intervals ____________________________________________  RADIOLOGY  I personally reviewed all radiographic images ordered to evaluate for the above acute complaints and reviewed radiology reports and findings.  These findings were personally discussed with the patient.  Please see medical record for radiology report.  ____________________________________________   PROCEDURES  Procedure(s) performed:  Procedures    Critical Care performed: no ____________________________________________   INITIAL IMPRESSION / ASSESSMENT AND PLAN / ED COURSE  Pertinent labs & imaging results that were available during my care of the patient were reviewed by me and considered in my medical decision making (see chart for details).   DDX: ACS, pericarditis, esophagitis, boerhaaves, pe, dissection, pna, bronchitis, costochondritis   HOYT LEANOS is a 73 y.o. who presents to the ED with symptoms as described above.  Patient well-appearing in no acute distress.  Exam is reassuring.  Initial EKG shows some nonspecific changes that appeared likely secondary to motion artifact therefore it was repeated immediately and shows a normal EKG.  Does have some mild hypertension but no other significant cardiac risk factors.  Initial troponin is negative.  Patient will be observed in the ER to further re-stratify for ACS.  Does not seem clinically consistent with dissection or PE.  Doubt pneumonia.  No findings to suggest bronchitis or CHF.  Clinical Course as of Feb 28 2250  Tue Mar 01, 2019  2226 Repeat troponin is negative.  At this point do believe patient stable and  appropriate for outpatient follow-up.   [PR]    Clinical Course User Index [PR] Merlyn Lot, MD     As part of my medical decision making, I reviewed the following data within the [place] notes reviewed and incorporated, Labs reviewed, notes from prior ED visits and [place] Controlled Substance Database   ____________________________________________  FINAL CLINICAL IMPRESSION(S) / ED DIAGNOSES  Final diagnoses:  Atypical chest pain      NEW MEDICATIONS STARTED DURING THIS VISIT:  Discharge Medication List as of 03/01/2019 10:26 PM       Note:  This document was prepared using Dragon voice recognition software and may include unintentional dictation errors.    Merlyn Lot, MD 03/01/19 2251

## 2019-03-16 ENCOUNTER — Encounter: Payer: Self-pay | Admitting: Family Medicine

## 2019-03-16 ENCOUNTER — Other Ambulatory Visit: Payer: Self-pay

## 2019-03-16 ENCOUNTER — Ambulatory Visit (INDEPENDENT_AMBULATORY_CARE_PROVIDER_SITE_OTHER): Payer: PPO | Admitting: Family Medicine

## 2019-03-16 VITALS — BP 142/80 | HR 65 | Temp 98.4°F | Resp 16 | Wt 216.0 lb

## 2019-03-16 DIAGNOSIS — I10 Essential (primary) hypertension: Secondary | ICD-10-CM

## 2019-03-16 DIAGNOSIS — R079 Chest pain, unspecified: Secondary | ICD-10-CM | POA: Diagnosis not present

## 2019-03-16 DIAGNOSIS — R1312 Dysphagia, oropharyngeal phase: Secondary | ICD-10-CM

## 2019-03-16 DIAGNOSIS — F410 Panic disorder [episodic paroxysmal anxiety] without agoraphobia: Secondary | ICD-10-CM | POA: Diagnosis not present

## 2019-03-16 DIAGNOSIS — F0390 Unspecified dementia without behavioral disturbance: Secondary | ICD-10-CM | POA: Diagnosis not present

## 2019-03-16 MED ORDER — RIVASTIGMINE TARTRATE 3 MG PO CAPS: ORAL_CAPSULE | ORAL | 1 refills | Status: DC

## 2019-03-16 MED ORDER — SERTRALINE HCL 100 MG PO TABS: 100.0000 mg | ORAL_TABLET | ORAL | 3 refills | Status: DC | PRN

## 2019-03-16 MED ORDER — HYDROCHLOROTHIAZIDE 25 MG PO TABS: 25.0000 mg | ORAL_TABLET | Freq: Every day | ORAL | 2 refills | Status: DC

## 2019-03-16 NOTE — Progress Notes (Signed)
Patient: Luke [name] Male    DOB: 1946-07-02   73 y.o.   MRN: [id number] Visit Date: 03/16/2019  Today's Provider: Lelon Huh, MD   Chief Complaint  Patient presents with  . Follow-up   Subjective:     HPI  Follow Up ER Visit  Patient is here for ER follow up.  He was recently seen at [facility] for Atypical Chest pain and shortness of breath on 03/01/2019. Had normal cardiac work up He reports good compliance with treatment. He reports this condition is Improved. His wife states she thinks it was due to panic attack. He has had a few anxiety attacks over the last couple of months. He also reports has sensation of throat swelling every once in a while and feels like food gets stuck in lower throat sometimes. He does have hstory of schatski ring dilated by Dr. Dionne Milo in 2012.  ------------------------------------------------------------------------------------  He also is here to follow up dementia. D/c donepzil and namenda at last visit in December and started on low dose of rivastigmine. Is supposed to be twice twice a day, but his wife reports that usually forget to take the second dose. Has had no adverse effects but has no improved memory at all.   He also needs BP medications refilled. He had  Been on losartan/hctz for years but was discontinue due to unavailability of medication. Was changed to separate prescription of hctz and losartan, but he was not able to fill losartan, but is taking 12.5mg  hctz every day.   No Known Allergies   Current Outpatient Medications:  .  hydrochlorothiazide (HYDRODIURIL) 12.5 MG tablet, Take 1 tablet (12.5 mg total) by mouth daily., Disp: 90 tablet, Rfl: 3 .  Multiple Vitamin (MULTIVITAMIN) tablet, Take 1 tablet by mouth daily., Disp: , Rfl:  .  rivastigmine (EXELON) 1.5 MG capsule, Take 1 capsule (1.5 mg total) by mouth 2 (two) times daily., (Taking 1 tablet daily) Disp: 60 capsule, Rfl: 4 .  sertraline (ZOLOFT) 50 MG tablet, Take  50 mg by mouth as needed. , Disp: , Rfl:  .  sildenafil (VIAGRA) 100 MG tablet, Take 100 mg by mouth as directed., Disp: , Rfl:  .  traMADol (ULTRAM) 50 MG tablet, Take 1 tablet (50 mg total) by mouth every 12 (twelve) hours as needed for moderate pain., Disp: 10 tablet, Rfl: 0 .  amLODipine (NORVASC) 10 MG tablet, Take 1 tablet (10 mg total) by mouth daily., Disp: 90 tablet, Rfl: 4 .  Cholecalciferol (VITAMIN D) 2000 units CAPS, Take 1 capsule by mouth daily. Reported on 02/07/2016, Disp: , Rfl:  .  losartan (COZAAR) 100 MG tablet, Please specify directions, refills and quantity (Patient not taking: Reported on 03/01/2019), Disp: 90 tablet, Rfl: 3 .  losartan-hydrochlorothiazide (HYZAAR) 100-12.5 MG tablet, Take 1 tablet by mouth daily., Disp: , Rfl:   Review of Systems  Constitutional: Negative for appetite change, chills and fever.  Respiratory: Negative for chest tightness, shortness of breath and wheezing.   Cardiovascular: Negative for chest pain and palpitations.  Gastrointestinal: Negative for abdominal pain, nausea and vomiting.  Psychiatric/Behavioral: Positive for dysphoric mood.    Social History   Tobacco Use  . Smoking status: Never Smoker  . Smokeless tobacco: Never Used  . Tobacco comment: smokes a Lexicographer with friends  Substance Use Topics  . Alcohol use: Yes    Alcohol/week: 2.0 standard drinks    Types: 2 Cans of beer per week    Comment:  occasional use      Objective:   BP (!) 142/80 (BP Location: Left Arm, Patient Position: Sitting, Cuff Size: Large)   Pulse 65   Temp 98.4 F (36.9 C) (Oral)   Resp 16   Wt 216 lb (98 kg)   SpO2 99%   BMI 29.29 kg/m  Vitals:   03/16/19 1511  BP: (!) 142/80  Pulse: 65  Resp: 16  Temp: 98.4 F (36.9 C)  TempSrc: Oral  SpO2: 99%  Weight: 216 lb (98 kg)     Physical Exam   General Appearance:    Alert, cooperative, no distress  Eyes:    PERRL, conjunctiva/corneas clear, EOM's intact       Lungs:      Clear to auscultation bilaterally, respirations unlabored  Heart:    Regular rate and rhythm  Neurologic:   Awake, alert, oriented x 2. No apparent focal neurological           defect.          Assessment & Plan    1. Chest pain, unspecified type Resolved, non cardiac. Possible anxiety attack, possible reflux.   2. Oropharyngeal dysphagia History of Schatzki ring last dilated in 2012 Will likely need to go back PPI, may need GI referral.  - DG UGI W DOUBLE CM (HD BA); Future  3. Anxiety attack Increase - sertraline (ZOLOFT) (previously prescribed by Dr. Eliberto Ivory) to 100 MG tablet; Take 1 tablet (100 mg total) by mouth as needed.  Dispense: 90 tablet; Refill: 3  4. Dementia without behavioral disturbance, unspecified dementia type ([place]) Only taking rivastigmine one a day, will change to 3mg  tablet, - rivastigmine (EXELON) 3 MG capsule; Take one tablet daily 14 days, then increase to two tablets daily  Dispense: 60 capsule; Refill: 1  5. Essential hypertension Off losartan/hctz and losartan due to national backorder. Increase - hydrochlorothiazide (HYDRODIURIL) from 12.5 to 25 MG tablet; Take 1 tablet (25 mg total) by mouth daily.  Dispense: 90 tablet; Refill: 2  Follow up in April as previously scheduled.     Lelon Huh, MD  [facility]

## 2019-03-16 NOTE — Patient Instructions (Signed)
.   Please review the attached list of medications and notify my office if there are any errors.   . Please bring all of your medications to every appointment so we can make sure that our medication list is the same as yours.   

## 2019-03-22 ENCOUNTER — Telehealth: Payer: Self-pay | Admitting: Family Medicine

## 2019-03-22 NOTE — Telephone Encounter (Signed)
Pt's appointment for upper GI is scheduled 04/15/19.Please let me know if this needs to be done sooner.[facility] is putting all appointments out until April unless MD thinks patient needs sooner appointment

## 2019-03-22 NOTE — Telephone Encounter (Signed)
It's not urgent. Can wait til April 17th. Thanks!

## 2019-04-08 ENCOUNTER — Other Ambulatory Visit: Payer: Self-pay | Admitting: Family Medicine

## 2019-04-08 DIAGNOSIS — F0390 Unspecified dementia without behavioral disturbance: Secondary | ICD-10-CM

## 2019-04-15 ENCOUNTER — Other Ambulatory Visit: Payer: Self-pay

## 2019-04-15 ENCOUNTER — Ambulatory Visit
Admission: RE | Admit: 2019-04-15 | Discharge: 2019-04-15 | Disposition: A | Discharge status: Home / Self Care | Payer: PPO | Source: Ambulatory Visit | Attending: Family Medicine | Admitting: Family Medicine

## 2019-04-15 DIAGNOSIS — K219 Gastro-esophageal reflux disease without esophagitis: Secondary | ICD-10-CM | POA: Diagnosis not present

## 2019-04-15 DIAGNOSIS — R1312 Dysphagia, oropharyngeal phase: Secondary | ICD-10-CM | POA: Diagnosis not present

## 2019-04-19 ENCOUNTER — Encounter: Payer: Self-pay | Admitting: Family Medicine

## 2019-04-19 ENCOUNTER — Other Ambulatory Visit: Payer: Self-pay

## 2019-04-19 ENCOUNTER — Ambulatory Visit (INDEPENDENT_AMBULATORY_CARE_PROVIDER_SITE_OTHER): Payer: PPO | Admitting: Family Medicine

## 2019-04-19 VITALS — BP 133/93 | HR 61 | Temp 97.5°F

## 2019-04-19 DIAGNOSIS — N433 Hydrocele, unspecified: Secondary | ICD-10-CM | POA: Insufficient documentation

## 2019-04-19 DIAGNOSIS — N50819 Testicular pain, unspecified: Secondary | ICD-10-CM | POA: Diagnosis not present

## 2019-04-19 DIAGNOSIS — R1310 Dysphagia, unspecified: Secondary | ICD-10-CM

## 2019-04-19 DIAGNOSIS — K222 Esophageal obstruction: Secondary | ICD-10-CM

## 2019-04-19 DIAGNOSIS — K219 Gastro-esophageal reflux disease without esophagitis: Secondary | ICD-10-CM

## 2019-04-19 DIAGNOSIS — F0390 Unspecified dementia without behavioral disturbance: Secondary | ICD-10-CM

## 2019-04-19 MED ORDER — OMEPRAZOLE 40 MG PO CPDR: 40.0000 mg | DELAYED_RELEASE_CAPSULE | Freq: Every day | ORAL | 3 refills | Status: DC

## 2019-04-19 MED ORDER — DOXYCYCLINE HYCLATE 100 MG PO TABS: 100.0000 mg | ORAL_TABLET | Freq: Two times a day (BID) | ORAL | 0 refills | Status: AC

## 2019-04-19 MED ORDER — RIVASTIGMINE TARTRATE 6 MG PO CAPS: 6.0000 mg | ORAL_CAPSULE | Freq: Two times a day (BID) | ORAL | 2 refills | Status: DC

## 2019-04-19 NOTE — Progress Notes (Signed)
Patient: Luke [name] Male    DOB: [birth year]   73 y.o.   MRN: [id number] Visit Date: 04/19/2019  Today's Provider: Lelon Huh, MD   Chief Complaint  Patient presents with  . Follow-up  Virtual Visit via Video Note  I connected with Luke [name] on 04/19/19 at 10:20 AM EDT by a video enabled telemedicine application and verified that I am speaking with the correct person using two identifiers.   I discussed the limitations of evaluation and management by telemedicine and the availability of in person appointments. The patient expressed understanding and agreed to proceed.    Subjective:    VIRTUAL VISIT  HPI  Follow up for Anxiety Attack:  The patient was last seen for this 1 months ago. Changes made at last visit include increasing Sertraline.  He reports fair compliance with treatment. He feels that condition is Unchanged. He is not having side effects. We also increased dose of Exelon to 3mg  twice a day. He is tolerating medication swell with no adverse effects, but memory is about the same.   ------------------------------------------------------------------------------------  Hypertension, follow-up:  BP Readings from Last 3 Encounters:  04/19/19 (!) 133/93  03/16/19 (!) 142/80  03/01/19 (!) 173/97    He was last seen for hypertension 1 months ago.  BP at that visit was 142/80. Management since that visit includes increasing HCTZ to 25mg  daily. He reports good compliance with treatment. He is not having side effects.  He is not exercising. He is adherent to low salt diet.   Outside blood pressures are better. .     Weight trend: stable Wt Readings from Last 3 Encounters:  03/16/19 216 lb (98 kg)  03/01/19 185 lb (83.9 kg)  12/17/18 215 lb (97.5 kg)     ------------------------------------------------------------------------ Follow up for dysphagia:  He reported last month that he was having trouble with food getting stuck when he  swallows. He has barium swallow last week and found to have reflux with suspected Schatzki ring causing delay in passage of tablet through distal esophagus. He reports he had seen Luke [name] in the past and had to have esophagus stretched. He states he is still having trouble food getting stuck when he swallows.   Testicular pain He also has long history of recurrent testicular pain. Was seen by Dr. Lissa [name] and Luke [name] in the past. Had ultrasound last year shwoing bilateral hydroceles. Luke [name] been treated with antibiotic in the past which seemed to have provided some improvement.   No Known Allergies   Current Outpatient Medications:  .  hydrochlorothiazide (HYDRODIURIL) 25 MG tablet, Take 1 tablet (25 mg total) by mouth daily., Disp: 90 tablet, Rfl: 2 .  Multiple Vitamin (MULTIVITAMIN) tablet, Take 1 tablet by mouth daily., Disp: , Rfl:  .  sertraline (ZOLOFT) 100 MG tablet, Take 1 tablet (100 mg total) by mouth as needed., Disp: 90 tablet, Rfl: 3 .  amLODipine (NORVASC) 10 MG tablet, Take 1 tablet (10 mg total) by mouth daily. (Patient reports not taking: Reported on 04/19/2019), Disp: 90 tablet, Rfl: 4. Had 90 dispensed on 04/09/2019 .  Cholecalciferol (VITAMIN D) 2000 units CAPS, Take 1 capsule by mouth daily. Reported on 02/07/2016, Disp: , Rfl:  .  doxycycline (VIBRA-TABS) 100 MG tablet, Take 1 tablet (100 mg total) by mouth 2 (two) times daily for 14 days., Disp: 28 tablet, Rfl: 0 .  losartan (COZAAR) 100 MG tablet, Please specify directions, refills and quantity (Patient not taking: Reported on  03/01/2019), Disp: 90 tablet, Rfl: 3 .  losartan-hydrochlorothiazide (HYZAAR) 100-12.5 MG tablet, Take 1 tablet by mouth daily., Disp: , Rfl:   (no dispense history) .  omeprazole (PRILOSEC) 40 MG capsule, Take 1 capsule (40 mg total) by mouth daily., Disp: 30 capsule, Rfl: 3 .  rivastigmine (EXELON) 6 MG capsule, Take 1 capsule (6 mg total) by mouth 2 (two) times daily. Take one tablet daily 14 days, then  increase to two tablets daily, Disp: 60 capsule, Rfl: 2 .  sildenafil (VIAGRA) 100 MG tablet, Take 100 mg by mouth as directed., Disp: , Rfl:  .  traMADol (ULTRAM) 50 MG tablet, Take 1 tablet (50 mg total) by mouth every 12 (twelve) hours as needed for moderate pain. (Patient not taking: Reported on 04/19/2019), Disp: 10 tablet, Rfl: 0  Review of Systems  Constitutional: Negative for appetite change, chills and fever.  Respiratory: Negative for chest tightness, shortness of breath and wheezing.   Cardiovascular: Negative for chest pain and palpitations.  Gastrointestinal: Negative for abdominal pain, nausea and vomiting.    Social History   Tobacco Use  . Smoking status: Never Smoker  . Smokeless tobacco: Never Used  . Tobacco comment: smokes a Luke [name] with friends  Substance Use Topics  . Alcohol use: Yes    Alcohol/week: 2.0 standard drinks    Types: 2 Cans of beer per week    Comment: occasional use      Objective:   BP (!) 133/93   Pulse 61   Temp (!) 97.5 F (36.4 C) (Oral)   General appearance: alert, well developed, well nourished, cooperative and in no distress Head: Normocephalic, without obvious abnormality, atraumatic Respiratory: Respirations even and unlabored, normal respiratory rate Extremities: No gross deformities Skin: Skin color, texture, turgor normal. No rashes seen  Psych: Appropriate mood and affect. Neurologic: Mental status: Alert, oriented to person and place     Assessment & Plan    1. Dementia without behavioral disturbance, unspecified dementia type (HCC) increase - rivastigmine (EXELON) 6 MG capsule; Take 1 capsule (6 mg total) by mouth 2 (two) times daily. Take one tablet daily 14 days, then increase to two tablets daily  Dispense: 60 capsule; Refill: 2  2. Dysphagia, unspecified type start- omeprazole (PRILOSEC) 40 MG capsule; Take 1 capsule (40 mg total) by mouth daily.  Dispense: 30 capsule; Refill: 3 - Ambulatory  referral to Gastroenterology  3. Lower esophageal ring (Schatzki)  - Ambulatory referral to Gastroenterology  4. Gastroesophageal reflux disease, esophagitis presence not specified start- omeprazole (PRILOSEC) 40 MG capsule; Take 1 capsule (40 mg total) by mouth daily.  Dispense: 30 capsule; Refill: 3 - Ambulatory referral to Gastroenterology  5. Hydrocele in adult Consider referral to another urologist if not improving on antibiotic as below.   6. Testicular pain, unspecified  - doxycycline (VIBRA-TABS) 100 MG tablet; Take 1 tablet (100 mg total) by mouth 2 (two) times daily for 14 days.  Dispense: 28 tablet; Refill: 0       The patient was advised to call back or seek an in-person evaluation if the symptoms worsen or if the condition fails to improve as anticipated.  I provided 16 minutes of non-face-to-face time during this encounter.   Luke Huh, MD   Luke Huh, MD  [facility]

## 2019-04-19 NOTE — Patient Instructions (Signed)
.   Please review the attached list of medications and notify my office if there are any errors.   . Please bring all of your medications to every appointment so we can make sure that our medication list is the same as yours.   

## 2019-04-25 ENCOUNTER — Encounter: Payer: Self-pay | Admitting: Gastroenterology

## 2019-04-25 ENCOUNTER — Ambulatory Visit (INDEPENDENT_AMBULATORY_CARE_PROVIDER_SITE_OTHER): Payer: PPO | Admitting: Gastroenterology

## 2019-04-25 ENCOUNTER — Other Ambulatory Visit: Payer: Self-pay

## 2019-04-25 DIAGNOSIS — R4702 Dysphasia: Secondary | ICD-10-CM

## 2019-04-25 NOTE — Progress Notes (Signed)
Luke Lame, MD [address]  [place]  [place], [place] [address]  Main: [phone]  Fax: [phone]    Gastroenterology Virtual/Video Visit  Referring Provider:     Birdie Sons, MD Primary Care Physician:  Luke Sons, MD Primary Gastroenterologist:  Dr.Aileene Lanum [place] Reason for Consultation:     Dysphasia        HPI:    Virtual Visit via Video Note Location of the patient: Home Location of provider: Office  Participating persons: The patient myself and Luke [name].  I connected with Luke [name] on 04/25/19 at  9:30 AM EDT by a video enabled telemedicine application and verified that I am speaking with the correct person using two identifiers.   I discussed the limitations of evaluation and management by telemedicine and the availability of in person appointments. The patient expressed understanding and agreed to proceed.  Verbal consent to proceed obtained.  History of Present Illness: Luke [name] is a 73 y.o. male referred by Dr. Birdie Sons, MD  for consultation & management of dysphasia.  This patient had seen me back in 2015 for a colonoscopy.  At that time it was reported in the chart that the patient had a adenoma of the colon.  He has had recent dysphasia with a upper GI showing:  IMPRESSION: 1. Mild tertiary contractions of the distal esophagus without significant dysmotility. 2. Small hiatal hernia and probable mild stricture of the distal esophagus with probable Schatzki's ring. No gastroesophageal reflux visualized during the exam. A barium tablet initially showed mild delay in passage through the distal esophagus/GE junction which cleared with additional swallowing of water.  The patient had also reported that he had a dilation in the past of his esophagus for dysphasia.  On the upper GI the patient was given a barium tablet that initially showed delay in passage but eventually cleared with additional water.  The patient  has been taking Prilosec 40 mg/day given by his primary care provider Dr. Caryn [name].  The patient's polyps at his last colonoscopy were quite large and he was recommended to have a repeat colonoscopy in 1 year.  The patient had been notified at that time but never followed up with a repeat colonoscopy.  The patient also reports approximately 10 pound weight loss since December of last year.  There is no report of any black stools or bloody stools.   Past Medical History:  Diagnosis Date   Acquired absence of kidney 12/29/1988   Brachial neuritis 04/02/2009   Claudication ([place]) 02/06/2016   Congenital renal agenesis and dysgenesis 02/06/2016   Dysphagia 04/02/2009   GERD (gastroesophageal reflux disease) 02/06/2016   Schatski ring seen on EGD 07-10-11    History of MRSA infection 11/18/2007   Hyperlipidemia, mixed 12/29/1998   Hypertension 04/02/2009   Hypogonadism male 02/06/2016   Insomnia 02/06/2016   LBP (low back pain) 07/13/2013   Numbness of toes 02/06/2016   Palpitations 02/06/2016   Prediabetes 02/08/2016   A1c=6.2 02/08/16    Prostate cancer ([place]) 2014   Seed Implants   Single kidney    Vitamin D deficiency 02/06/2016    Past Surgical History:  Procedure Laterality Date   ABI  07/09/2011   Normal; Left= 1.21, Right=1.00. Triphasic waver forms   Carotid Doppler Ultrasound  10/18/2012   Minimal soft plague both carotids. No hemodynamically significant stenosis   CHOLECYSTECTOMY  2001   Lumbar Spine X Ray  11/28/2011   Mild DDD L1- L2.  Anterior heigt loss of T12-L1 vertebral bodies   NEPHRECTOMY Right 2001   done by Dr. Eliberto [name] for Angiomyolipoma   PENILE PROSTHESIS  REMOVAL  11/28/2013   Dr. Eliberto [name]   PENILE PROSTHESIS IMPLANT  2008   Photovaporization of prostate with green light laser  01/19/2012   Dr. Rogers [name]   SPINE SURGERY  1991   UPPER GI ENDOSCOPY  07/10/2011   Done by Dr. Sharen Counter. Revealed Schatski rings    Prior to Admission medications   Medication Sig Start  Date End Date Taking? Authorizing Provider  amLODipine (NORVASC) 10 MG tablet Take 1 tablet (10 mg total) by mouth daily. Patient not taking: Reported on 04/19/2019 09/17/18   Luke Sons, MD  Cholecalciferol (VITAMIN D) 2000 units CAPS Take 1 capsule by mouth daily. Reported on 02/07/2016    [provider]  doxycycline (VIBRA-TABS) 100 MG tablet Take 1 tablet (100 mg total) by mouth 2 (two) times daily for 14 days. 04/19/19 05/03/19  Luke Sons, MD  hydrochlorothiazide (HYDRODIURIL) 25 MG tablet Take 1 tablet (25 mg total) by mouth daily. 03/16/19   Luke Sons, MD  losartan (COZAAR) 100 MG tablet Please specify directions, refills and quantity Patient not taking: Reported on 03/01/2019 01/12/19   Luke Sons, MD  losartan-hydrochlorothiazide (HYZAAR) 100-12.5 MG tablet Take 1 tablet by mouth daily.    [provider]  Multiple Vitamin (MULTIVITAMIN) tablet Take 1 tablet by mouth daily.    [provider]  omeprazole (PRILOSEC) 40 MG capsule Take 1 capsule (40 mg total) by mouth daily. 04/19/19   Luke Sons, MD  rivastigmine (EXELON) 6 MG capsule Take 1 capsule (6 mg total) by mouth 2 (two) times daily. Take one tablet daily 14 days, then increase to two tablets daily 04/19/19   Luke Sons, MD  sertraline (ZOLOFT) 100 MG tablet Take 1 tablet (100 mg total) by mouth as needed. 03/16/19   Luke Sons, MD  sildenafil (VIAGRA) 100 MG tablet Take 100 mg by mouth as directed. 02/15/19   [provider]  traMADol (ULTRAM) 50 MG tablet Take 1 tablet (50 mg total) by mouth every 12 (twelve) hours as needed for moderate pain. Patient not taking: Reported on 04/19/2019 05/20/18   Abbie Sons, MD    Family History  Problem Relation Age of Onset   Heart attack Mother    Hypertension Father    Prostate cancer Neg Hx    Bladder Cancer Neg Hx    Kidney cancer Neg Hx      Social History   Tobacco Use   Smoking status: Never Smoker    Smokeless tobacco: Never Used   Tobacco comment: smokes a Lexicographer with friends  Substance Use Topics   Alcohol use: Yes    Alcohol/week: 2.0 standard drinks    Types: 2 Cans of beer per week    Comment: occasional use   Drug use: No    Allergies as of 04/25/2019   (No Known Allergies)    Review of Systems:    All systems reviewed and negative except where noted in HPI.   Observations/Objective:  Labs: CBC    Component Value Date/Time   WBC 6.5 03/01/2019 1618   RBC 5.76 03/01/2019 1618   HGB 12.7 (L) 03/01/2019 1618   HGB 13.2 06/11/2018 1002   HCT 40.1 03/01/2019 1618   HCT 40.8 06/11/2018 1002   PLT 298 03/01/2019 1618   PLT 269 06/11/2018 1002   MCV  69.6 (L) 03/01/2019 1618   MCV 67 (L) 06/11/2018 1002   MCV 66 (L) 12/24/2013 2103   MCH 22.0 (L) 03/01/2019 1618   MCHC 31.7 03/01/2019 1618   RDW 16.6 (H) 03/01/2019 1618   RDW 17.3 (H) 06/11/2018 1002   RDW 15.0 (H) 12/24/2013 2103   LYMPHSABS 2.2 12/24/2013 2103   MONOABS 0.8 12/24/2013 2103   EOSABS 0.0 12/24/2013 2103   BASOSABS 0.1 12/24/2013 2103   CMP     Component Value Date/Time   NA 139 03/01/2019 1618   NA 141 06/11/2018 1002   NA 138 12/24/2013 2103   K 4.0 03/01/2019 1618   K 3.5 12/24/2013 2103   CL 107 03/01/2019 1618   CL 104 12/24/2013 2103   CO2 24 03/01/2019 1618   CO2 29 12/24/2013 2103   GLUCOSE 101 (H) 03/01/2019 1618   GLUCOSE 93 12/24/2013 2103   BUN 17 03/01/2019 1618   BUN 14 06/11/2018 1002   BUN 20 (H) 12/24/2013 2103   CREATININE 1.20 03/01/2019 1618   CREATININE 1.55 (H) 12/24/2013 2103   CALCIUM 9.4 03/01/2019 1618   CALCIUM 9.5 12/24/2013 2103   PROT 7.2 06/26/2018 1100   PROT 6.9 06/11/2018 1002   PROT 7.6 07/27/2013 1512   ALBUMIN 4.0 06/26/2018 1100   ALBUMIN 4.5 06/11/2018 1002   ALBUMIN 3.6 07/27/2013 1512   AST 20 06/26/2018 1100   AST 15 07/27/2013 1512   ALT 11 06/26/2018 1100   ALT 18 07/27/2013 1512   ALKPHOS 67 06/26/2018 1100    ALKPHOS 80 07/27/2013 1512   BILITOT 1.0 06/26/2018 1100   BILITOT 0.6 06/11/2018 1002   BILITOT 0.6 07/27/2013 1512   GFRNONAA >60 03/01/2019 1618   GFRNONAA 46 (L) 12/24/2013 2103   GFRAA >60 03/01/2019 1618   GFRAA 53 (L) 12/24/2013 2103    Imaging Studies: Dg Ugi W Double Cm (hd Ba)  Result Date: 04/15/2019 CLINICAL DATA:  Oropharyngeal dysphagia. EXAM: UPPER GI SERIES WITHOUT KUB TECHNIQUE: Routine upper GI series was performed with thin and high density barium. FLUOROSCOPY TIME:  Fluoroscopy Time:  3 minutes and 6 seconds. Radiation Exposure Index (if provided by the fluoroscopic device): 80.6 mGy COMPARISON:  None. FINDINGS: The esophagus demonstrated minimal tertiary contractions in the distal esophagus in the upright position. On both upright and prone drinking examination, there is evidence of a small hiatal hernia. Associated mild narrowing of the distal esophagus with probable Schatzki's ring. No active gastroesophageal reflux was identified during the exam. A barium tablet was able to be swallowed and did initially show mild delay in passage through the distal esophagus/GE junction which cleared with additional swallowing of water. The stomach and duodenum show a normal appearance without evidence of visible mass, ulceration or other abnormality. The ligament of Treitz is normally positioned to the left of the spine. IMPRESSION: 1. Mild tertiary contractions of the distal esophagus without significant dysmotility. 2. Small hiatal hernia and probable mild stricture of the distal esophagus with probable Schatzki's ring. No gastroesophageal reflux visualized during the exam. A barium tablet initially showed mild delay in passage through the distal esophagus/GE junction which cleared with additional swallowing of water. Electronically Signed   By: Aletta Edouard M.D.   On: 04/15/2019 14:47    Assessment and Plan:   MCKAY TEGTMEYER is a 73 y.o. y/o male has been referred for dysphasia.   The patient also has a history of polyps with one polyp being 20 mm and pathology showing multiple polyps  to be tubulovillous adenomas.  The patient did not follow-up with his repeat colonoscopy in 1 year as he was requested.  The patient now has dysphasia to solids with meats chicken and bread being more bothersome.  He has had to put his finger down his throat to bring up the food.  The patient will be set up for a EGD and colonoscopy.  This will need to be done on a urgent basis.  The patient has been explained the plan and agrees with it.  Follow Up Instructions:  I discussed the assessment and treatment plan with the patient. The patient was provided an opportunity to ask questions and all were answered. The patient agreed with the plan and demonstrated an understanding of the instructions.   The patient was advised to call back or seek an in-person evaluation if the symptoms worsen or if the condition fails to improve as anticipated.  I provided 20 minutes of non-face-to-face time during this encounter.   Luke Lame, MD  Speech recognition software was used to dictate the above note.

## 2019-05-19 ENCOUNTER — Encounter: Payer: Self-pay | Admitting: Gastroenterology

## 2019-06-07 ENCOUNTER — Other Ambulatory Visit: Payer: Self-pay

## 2019-06-07 ENCOUNTER — Encounter: Payer: Self-pay | Admitting: *Deleted

## 2019-06-07 DIAGNOSIS — R4702 Dysphasia: Secondary | ICD-10-CM

## 2019-06-07 DIAGNOSIS — Z86010 Personal history of colonic polyps: Secondary | ICD-10-CM

## 2019-06-07 MED ORDER — NA SULFATE-K SULFATE-MG SULF 17.5-3.13-1.6 GM/177ML PO SOLN: 1.0000 | ORAL | 0 refills | Status: DC

## 2019-06-09 ENCOUNTER — Other Ambulatory Visit
Admission: RE | Admit: 2019-06-09 | Discharge: 2019-06-09 | Disposition: A | Discharge status: Home / Self Care | Payer: PPO | Source: Ambulatory Visit | Attending: Gastroenterology | Admitting: Gastroenterology

## 2019-06-09 ENCOUNTER — Other Ambulatory Visit: Payer: Self-pay

## 2019-06-09 DIAGNOSIS — Z1159 Encounter for screening for other viral diseases: Secondary | ICD-10-CM | POA: Insufficient documentation

## 2019-06-10 ENCOUNTER — Ambulatory Visit: Payer: Medicare Other

## 2019-06-10 LAB — NOVEL CORONAVIRUS, NAA (HOSP ORDER, SEND-OUT TO REF LAB; TAT 18-24 HRS): SARS-CoV-2, NAA: NOT DETECTED

## 2019-06-10 NOTE — Discharge Instructions (Signed)
General Anesthesia, Adult, Care After  This sheet gives you information about how to care for yourself after your procedure. Your health care provider may also give you more specific instructions. If you have problems or questions, contact your health care provider.  What can I expect after the procedure?  After the procedure, the following side effects are common:  Pain or discomfort at the IV site.  Nausea.  Vomiting.  Sore throat.  Trouble concentrating.  Feeling cold or chills.  Weak or tired.  Sleepiness and fatigue.  Soreness and body aches. These side effects can affect parts of the body that were not involved in surgery.  Follow these instructions at home:    For at least 24 hours after the procedure:  Have a responsible adult stay with you. It is important to have someone help care for you until you are awake and alert.  Rest as needed.  Do not:  Participate in activities in which you could fall or become injured.  Drive.  Use heavy machinery.  Drink alcohol.  Take sleeping pills or medicines that cause drowsiness.  Make important decisions or sign legal documents.  Take care of children on your own.  Eating and drinking  Follow any instructions from your health care provider about eating or drinking restrictions.  When you feel hungry, start by eating small amounts of foods that are soft and easy to digest (bland), such as toast. Gradually return to your regular diet.  Drink enough fluid to keep your urine pale yellow.  If you vomit, rehydrate by drinking water, juice, or clear broth.  General instructions  If you have sleep apnea, surgery and certain medicines can increase your risk for breathing problems. Follow instructions from your health care provider about wearing your sleep device:  Anytime you are sleeping, including during daytime naps.  While taking prescription pain medicines, sleeping medicines, or medicines that make you drowsy.  Return to your normal activities as told by your health care  provider. Ask your health care provider what activities are safe for you.  Take over-the-counter and prescription medicines only as told by your health care provider.  If you smoke, do not smoke without supervision.  Keep all follow-up visits as told by your health care provider. This is important.  Contact a health care provider if:  You have nausea or vomiting that does not get better with medicine.  You cannot eat or drink without vomiting.  You have pain that does not get better with medicine.  You are unable to pass urine.  You develop a skin rash.  You have a fever.  You have redness around your IV site that gets worse.  Get help right away if:  You have difficulty breathing.  You have chest pain.  You have blood in your urine or stool, or you vomit blood.  Summary  After the procedure, it is common to have a sore throat or nausea. It is also common to feel tired.  Have a responsible adult stay with you for the first 24 hours after general anesthesia. It is important to have someone help care for you until you are awake and alert.  When you feel hungry, start by eating small amounts of foods that are soft and easy to digest (bland), such as toast. Gradually return to your regular diet.  Drink enough fluid to keep your urine pale yellow.  Return to your normal activities as told by your health care provider. Ask your health care   provider what activities are safe for you.  This information is not intended to replace advice given to you by your health care provider. Make sure you discuss any questions you have with your health care provider.  Document Released: 03/23/2001 Document Revised: 07/31/2017 Document Reviewed: 07/31/2017  Elsevier Interactive Patient Education  2019 [employer]

## 2019-06-13 ENCOUNTER — Encounter
Admission: RE | Disposition: A | Discharge status: Home / Self Care | Payer: Self-pay | Source: Home / Self Care | Attending: Gastroenterology

## 2019-06-13 ENCOUNTER — Ambulatory Visit: Payer: PPO | Admitting: Anesthesiology

## 2019-06-13 ENCOUNTER — Ambulatory Visit
Admission: RE | Admit: 2019-06-13 | Discharge: 2019-06-13 | Disposition: A | Discharge status: Home / Self Care | Payer: PPO | Attending: Gastroenterology | Admitting: Gastroenterology

## 2019-06-13 ENCOUNTER — Other Ambulatory Visit: Payer: Self-pay

## 2019-06-13 DIAGNOSIS — K635 Polyp of colon: Secondary | ICD-10-CM

## 2019-06-13 DIAGNOSIS — Z8614 Personal history of Methicillin resistant Staphylococcus aureus infection: Secondary | ICD-10-CM | POA: Insufficient documentation

## 2019-06-13 DIAGNOSIS — D123 Benign neoplasm of transverse colon: Secondary | ICD-10-CM | POA: Diagnosis not present

## 2019-06-13 DIAGNOSIS — Z1211 Encounter for screening for malignant neoplasm of colon: Secondary | ICD-10-CM | POA: Diagnosis not present

## 2019-06-13 DIAGNOSIS — K222 Esophageal obstruction: Secondary | ICD-10-CM | POA: Diagnosis not present

## 2019-06-13 DIAGNOSIS — Z8546 Personal history of malignant neoplasm of prostate: Secondary | ICD-10-CM | POA: Insufficient documentation

## 2019-06-13 DIAGNOSIS — E559 Vitamin D deficiency, unspecified: Secondary | ICD-10-CM | POA: Diagnosis not present

## 2019-06-13 DIAGNOSIS — I10 Essential (primary) hypertension: Secondary | ICD-10-CM | POA: Diagnosis not present

## 2019-06-13 DIAGNOSIS — R7303 Prediabetes: Secondary | ICD-10-CM | POA: Insufficient documentation

## 2019-06-13 DIAGNOSIS — R002 Palpitations: Secondary | ICD-10-CM | POA: Diagnosis not present

## 2019-06-13 DIAGNOSIS — K219 Gastro-esophageal reflux disease without esophagitis: Secondary | ICD-10-CM | POA: Diagnosis not present

## 2019-06-13 DIAGNOSIS — Z860100 Personal history of colon polyps, unspecified: Secondary | ICD-10-CM

## 2019-06-13 DIAGNOSIS — R51 Headache: Secondary | ICD-10-CM | POA: Diagnosis not present

## 2019-06-13 DIAGNOSIS — K449 Diaphragmatic hernia without obstruction or gangrene: Secondary | ICD-10-CM | POA: Insufficient documentation

## 2019-06-13 DIAGNOSIS — Z8249 Family history of ischemic heart disease and other diseases of the circulatory system: Secondary | ICD-10-CM | POA: Insufficient documentation

## 2019-06-13 DIAGNOSIS — R1310 Dysphagia, unspecified: Secondary | ICD-10-CM | POA: Insufficient documentation

## 2019-06-13 DIAGNOSIS — K5730 Diverticulosis of large intestine without perforation or abscess without bleeding: Secondary | ICD-10-CM | POA: Insufficient documentation

## 2019-06-13 DIAGNOSIS — R933 Abnormal findings on diagnostic imaging of other parts of digestive tract: Secondary | ICD-10-CM | POA: Diagnosis not present

## 2019-06-13 DIAGNOSIS — K640 First degree hemorrhoids: Secondary | ICD-10-CM | POA: Insufficient documentation

## 2019-06-13 DIAGNOSIS — F0390 Unspecified dementia without behavioral disturbance: Secondary | ICD-10-CM | POA: Diagnosis not present

## 2019-06-13 DIAGNOSIS — G4700 Insomnia, unspecified: Secondary | ICD-10-CM | POA: Insufficient documentation

## 2019-06-13 DIAGNOSIS — Z79899 Other long term (current) drug therapy: Secondary | ICD-10-CM | POA: Diagnosis not present

## 2019-06-13 DIAGNOSIS — M545 Low back pain: Secondary | ICD-10-CM | POA: Insufficient documentation

## 2019-06-13 DIAGNOSIS — E785 Hyperlipidemia, unspecified: Secondary | ICD-10-CM | POA: Insufficient documentation

## 2019-06-13 DIAGNOSIS — Z905 Acquired absence of kidney: Secondary | ICD-10-CM | POA: Insufficient documentation

## 2019-06-13 DIAGNOSIS — E782 Mixed hyperlipidemia: Secondary | ICD-10-CM | POA: Insufficient documentation

## 2019-06-13 HISTORY — PX: ESOPHAGOGASTRODUODENOSCOPY (EGD) WITH PROPOFOL: SHX5813

## 2019-06-13 HISTORY — PX: POLYPECTOMY: SHX5525

## 2019-06-13 HISTORY — PX: COLONOSCOPY WITH PROPOFOL: SHX5780

## 2019-06-13 HISTORY — PX: BALLOON DILATION: SHX5330

## 2019-06-13 SURGERY — COLONOSCOPY WITH PROPOFOL
Anesthesia: Monitor Anesthesia Care | Site: Rectum

## 2019-06-13 MED ORDER — GLYCOPYRROLATE 0.2 MG/ML IJ SOLN
INTRAMUSCULAR | Status: DC | PRN
  Administered 2019-06-13: .2 mg via INTRAVENOUS

## 2019-06-13 MED ORDER — LACTATED RINGERS IV SOLN
INTRAVENOUS | Status: DC
  Administered 2019-06-13: 10:00:00 via INTRAVENOUS

## 2019-06-13 MED ORDER — STERILE WATER FOR IRRIGATION IR SOLN
Status: DC | PRN
  Administered 2019-06-13: 15 mL

## 2019-06-13 MED ORDER — PROPOFOL 10 MG/ML IV BOLUS
INTRAVENOUS | Status: DC | PRN
  Administered 2019-06-13 (×3): 20 mg via INTRAVENOUS
  Administered 2019-06-13: 100 mg via INTRAVENOUS
  Administered 2019-06-13: 50 mg via INTRAVENOUS
  Administered 2019-06-13 (×6): 20 mg via INTRAVENOUS

## 2019-06-13 MED ORDER — DEXMEDETOMIDINE HCL 200 MCG/2ML IV SOLN
INTRAVENOUS | Status: DC | PRN
  Administered 2019-06-13: 6 ug via INTRAVENOUS

## 2019-06-13 MED ORDER — ONDANSETRON HCL 4 MG/2ML IJ SOLN: 4.0000 mg | Freq: Once | INTRAMUSCULAR | Status: DC | PRN

## 2019-06-13 MED ORDER — LIDOCAINE HCL (CARDIAC) PF 100 MG/5ML IV SOSY
PREFILLED_SYRINGE | INTRAVENOUS | Status: DC | PRN
  Administered 2019-06-13: 40 mg via INTRAVENOUS

## 2019-06-13 SURGICAL SUPPLY — 11 items
BALLN DILATOR 15-18 8 (BALLOONS) ×4
BALLOON DILATOR 15-18 8 (BALLOONS) ×2 IMPLANT
BLOCK BITE 60FR ADLT L/F GRN (MISCELLANEOUS) ×4 IMPLANT
CANISTER SUCT 1200ML W/VALVE (MISCELLANEOUS) ×4 IMPLANT
GOWN CVR UNV OPN BCK APRN NK (MISCELLANEOUS) ×4 IMPLANT
GOWN ISOL THUMB LOOP REG UNIV (MISCELLANEOUS) ×4
KIT ENDO PROCEDURE OLY (KITS) ×4 IMPLANT
SNARE SHORT THROW 13M SML OVAL (MISCELLANEOUS) ×4 IMPLANT
SYR INFLATION 60ML (SYRINGE) ×4 IMPLANT
TRAP ETRAP POLY (MISCELLANEOUS) ×4 IMPLANT
WATER STERILE IRR 250ML POUR (IV SOLUTION) ×4 IMPLANT

## 2019-06-13 NOTE — Anesthesia Postprocedure Evaluation (Signed)
Anesthesia Post Note  Patient: Luke [name]  Procedure(s) Performed: COLONOSCOPY WITH BIOPSIES (N/A Rectum) ESOPHAGOGASTRODUODENOSCOPY (EGD) WITH BALLOON DILATION (N/A Esophagus) BALLOON DILATION (N/A Esophagus) POLYPECTOMY (N/A Rectum)  Patient location during evaluation: PACU Anesthesia Type: MAC Level of consciousness: awake Pain management: pain level controlled Vital Signs Assessment: post-procedure vital signs reviewed and stable Respiratory status: respiratory function stable Cardiovascular status: stable Postop Assessment: no signs of nausea or vomiting Anesthetic complications: no    [name]

## 2019-06-13 NOTE — H&P (Signed)
Lucilla Lame, MD [address] [place], [place] [address] Phone:[phone] Fax : [phone]  Primary Care Physician:  Birdie Sons, MD Primary Gastroenterologist:  Dr. Allen Norris  Pre-Procedure History & Physical: HPI:  Luke [name] is a 73 y.o. male is here for an endoscopy and colonoscopy.   Past Medical History:  Diagnosis Date  . Acquired absence of kidney 12/29/1988  . Brachial neuritis 04/02/2009  . Claudication ([place]) 02/06/2016  . 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 Dysphagia 04/02/2009  . GERD (gastroesophageal reflux disease) 02/06/2016   Schatski ring seen on EGD 07-10-11   . History of MRSA infection 11/18/2007  . Hyperlipidemia, mixed 12/29/1998  . Hypertension 04/02/2009  . Hypogonadism male 02/06/2016  . Insomnia 02/06/2016  . LBP (low back pain) 07/13/2013  . Numbness of toes 02/06/2016  . Palpitations 02/06/2016  . Prediabetes 02/08/2016   A1c=6.2 02/08/16   . Prostate cancer ([place]) 2014   Seed Implants  . Single kidney   . Vitamin D deficiency 02/06/2016    Past Surgical History:  Procedure Laterality Date  . ABI  07/09/2011   Normal; Left= 1.21, Right=1.00. Triphasic waver forms  . Carotid Doppler Ultrasound  10/18/2012   Minimal soft plague both carotids. No hemodynamically significant stenosis  . CHOLECYSTECTOMY  2001  . Lumbar Spine X Ray  11/28/2011   Mild DDD L1- L2. Anterior heigt loss of T12-L1 vertebral bodies  . NEPHRECTOMY Right 2001   done by Dr. Eliberto Ivory for Angiomyolipoma  . PENILE PROSTHESIS  REMOVAL  11/28/2013   Dr. Eliberto Ivory  . PENILE PROSTHESIS IMPLANT  2008  . Photovaporization of prostate with green light laser  01/19/2012   Dr. Rogers Blocker  . [place]  . UPPER GI ENDOSCOPY  07/10/2011   Done by Dr. Sharen Counter. Revealed Schatski rings    Prior to Admission medications   Medication Sig Start Date End Date Taking? Authorizing Provider  Cholecalciferol (VITAMIN D) 2000 units CAPS Take 1 capsule by mouth daily. Reported  on 02/07/2016   Yes [provider]  hydrochlorothiazide (HYDRODIURIL) 25 MG tablet Take 1 tablet (25 mg total) by mouth daily. 03/16/19  Yes Birdie Sons, MD  rivastigmine (EXELON) 6 MG capsule Take 1 capsule (6 mg total) by mouth 2 (two) times daily. Take one tablet daily 14 days, then increase to two tablets daily 04/19/19  Yes Birdie Sons, MD  sertraline (ZOLOFT) 100 MG tablet Take 1 tablet (100 mg total) by mouth as needed. 03/16/19  Yes Birdie Sons, MD  amLODipine (NORVASC) 10 MG tablet Take 1 tablet (10 mg total) by mouth daily. Patient not taking: Reported on 04/19/2019 09/17/18   Birdie Sons, MD  losartan (COZAAR) 100 MG tablet Please specify directions, refills and quantity Patient not taking: Reported on 03/01/2019 01/12/19   Birdie Sons, MD  losartan-hydrochlorothiazide (HYZAAR) 100-12.5 MG tablet Take 1 tablet by mouth daily.    [provider]  Multiple Vitamin (MULTIVITAMIN) tablet Take 1 tablet by mouth daily.    [provider]  Na Sulfate-K Sulfate-Mg Sulf (SUPREP BOWEL PREP KIT) 17.5-3.13-1.6 GM/177ML SOLN Take 1 kit by mouth as directed. 06/07/19   Lucilla Lame, MD  omeprazole (PRILOSEC) 40 MG capsule Take 1 capsule (40 mg total) by mouth daily. 04/19/19   Birdie Sons, MD  sildenafil (VIAGRA) 100 MG tablet Take 100 mg by mouth as directed. 02/15/19   [provider]  traMADol (ULTRAM) 50 MG tablet Take 1  tablet (50 mg total) by mouth every 12 (twelve) hours as needed for moderate pain. Patient not taking: Reported on 04/19/2019 05/20/18   Abbie Sons, MD    Allergies as of 06/07/2019  . (No Known Allergies)    Family History  Problem Relation Age of Onset  . Heart attack Mother   . Hypertension Father   . Prostate cancer Neg Hx   . Bladder Cancer Neg Hx   . Kidney cancer Neg Hx     Social History   Socioeconomic History  . Marital status: Married    Spouse name: Not on file  . Number of children: 2  . Years  of education: Not on file  . Highest education level: 10th grade  Occupational History  . Occupation: Retired  Scientific laboratory technician  . Financial resource strain: Not on file  . Food insecurity    Worry: Never true    Inability: Never true  . Transportation needs    Medical: No    Non-medical: No  Tobacco Use  . Smoking status: Never Smoker  . Smokeless tobacco: Never Used  . Tobacco comment: smokes a Lexicographer with friends  Substance and Sexual Activity  . Alcohol use: Yes    Alcohol/week: 2.0 standard drinks    Types: 2 Cans of beer per week    Comment: occasional use  . Drug use: No  . Sexual activity: Not Currently  Lifestyle  . Physical activity    Days per week: Not on file    Minutes per session: Not on file  . Stress: Only a little  Relationships  . Social Herbalist on phone: Not on file    Gets together: Not on file    Attends religious service: Not on file    Active member of club or organization: Not on file    Attends meetings of clubs or organizations: Not on file    Relationship status: Not on file  . Intimate partner violence    Fear of current or ex partner: Not on file    Emotionally abused: Not on file    Physically abused: Not on file    Forced sexual activity: Not on file  Other Topics Concern  . Not on file  Social History Narrative  . Not on file    Review of Systems: See HPI, otherwise negative ROS  Physical Exam: Ht 6' (1.829 m)   Wt 99.8 kg   BMI 29.84 kg/m  General:   Alert,  pleasant and cooperative in NAD Head:  Normocephalic and atraumatic. Neck:  Supple; no masses or thyromegaly. Lungs:  Clear throughout to auscultation.    Heart:  Regular rate and rhythm. Abdomen:  Soft, nontender and nondistended. Normal bowel sounds, without guarding, and without rebound.   Neurologic:  Alert and  oriented x4;  grossly normal neurologically.  Impression/Plan: Luke [name] is here for an endoscopy and colonoscopy to be  performed for Dysphagia and history of colon polyps 04/07/2014.  Risks, benefits, limitations, and alternatives regarding  endoscopy and colonoscopy have been reviewed with the patient.  Questions have been answered.  All parties agreeable.   Lucilla Lame, MD  06/13/2019, 9:35 AM

## 2019-06-13 NOTE — Anesthesia Procedure Notes (Signed)
Procedure Name: MAC Date/Time: 06/13/2019 10:16 AM Performed by: Janna Arch, CRNA Pre-anesthesia Checklist: Patient identified, Emergency Drugs available, Suction available, Timeout performed and Patient being monitored Patient Re-evaluated:Patient Re-evaluated prior to induction Oxygen Delivery Method: Nasal cannula Placement Confirmation: positive ETCO2

## 2019-06-13 NOTE — Transfer of Care (Signed)
Immediate Anesthesia Transfer of Care Note  Patient: Luke [name]  Procedure(s) Performed: COLONOSCOPY WITH BIOPSIES (N/A Rectum) ESOPHAGOGASTRODUODENOSCOPY (EGD) WITH BALLOON DILATION (N/A Esophagus) BALLOON DILATION (N/A Esophagus) POLYPECTOMY (N/A Rectum)  Patient Location: PACU  Anesthesia Type: MAC  Level of Consciousness: awake, alert  and patient cooperative  Airway and Oxygen Therapy: Patient Spontanous Breathing and Patient connected to supplemental oxygen  Post-op Assessment: Post-op Vital signs reviewed, Patient's Cardiovascular Status Stable, Respiratory Function Stable, Patent Airway and No signs of Nausea or vomiting  Post-op Vital Signs: Reviewed and stable  Complications: No apparent anesthesia complications

## 2019-06-13 NOTE — Op Note (Signed)
[facility] Gastroenterology Patient Name: Luke [name] Procedure Date: 06/13/2019 10:11 AM MRN: [id number] Account #: [id number] Date of Birth: [birth year] Admit Type: Outpatient Age: 73 Room: [facility] OR ROOM 01 Gender: Male Note Status: Finalized Procedure:            Colonoscopy Indications:          High risk colon cancer surveillance: Personal history                        of colonic polyps Providers:            Lucilla Lame MD, MD Medicines:            Propofol per Anesthesia Complications:        No immediate complications. Procedure:            Pre-Anesthesia Assessment:                       - Prior to the procedure, a History and Physical was                        performed, and patient medications and allergies were                        reviewed. The patient's tolerance of previous                        anesthesia was also reviewed. The risks and benefits of                        the procedure and the sedation options and risks were                        discussed with the patient. All questions were                        answered, and informed consent was obtained. Prior                        Anticoagulants: The patient has taken no previous                        anticoagulant or antiplatelet agents. ASA Grade                        Assessment: II - A patient with mild systemic disease.                        After reviewing the risks and benefits, the patient was                        deemed in satisfactory condition to undergo the                        procedure.                       After obtaining informed consent, the colonoscope was                        passed under direct vision. Throughout the  procedure,                        the patient's blood pressure, pulse, and oxygen                        saturations were monitored continuously. The                        Colonoscope was introduced through the anus and                         advanced to the the cecum, identified by appendiceal                        orifice and ileocecal valve. The colonoscopy was                        performed without difficulty. The patient tolerated the                        procedure well. The quality of the bowel preparation                        was good. Findings:      The perianal and digital rectal examinations were normal.      Two sessile polyps were found in the transverse colon. The polyps were 3       to 4 mm in size. These polyps were removed with a cold snare. Resection       and retrieval were complete.      Multiple small-mouthed diverticula were found in the sigmoid colon.      Non-bleeding internal hemorrhoids were found during retroflexion. The       hemorrhoids were Grade I (internal hemorrhoids that do not prolapse). Impression:           - Two 3 to 4 mm polyps in the transverse colon, removed                        with a cold snare. Resected and retrieved.                       - Diverticulosis in the sigmoid colon.                       - Non-bleeding internal hemorrhoids. Recommendation:       - Discharge patient to home.                       - Resume previous diet.                       - Continue present medications.                       - Await pathology results. Procedure Code(s):    --- Professional ---                       [phone], Colonoscopy, flexible; with removal of tumor(s),                        polyp(s),  or other lesion(s) by snare technique Diagnosis Code(s):    --- Professional ---                       Z86.010, Personal history of colonic polyps                       K63.5, Polyp of colon CPT copyright 2019 American Medical Association. All rights reserved. The codes documented in this report are preliminary and upon coder review may  be revised to meet current compliance requirements. Lucilla Lame MD, MD 06/13/2019 10:44:55 AM This report has been signed electronically. Number of Addenda:  0 Note Initiated On: 06/13/2019 10:11 AM Scope Withdrawal Time: 0 hours 5 minutes 37 seconds  Total Procedure Duration: 0 hours 13 minutes 32 seconds  Estimated Blood Loss: Estimated blood loss: none.      [facility]

## 2019-06-13 NOTE — Op Note (Signed)
[facility]enter[facility] Patient Name: Luke [name] Procedure Date: 06/13/2019 10:12 AM MRN: [id number] Account #: [id number] Date of Birth: [birth year] Admit Type: Outpatient Age: 73 Room: [facility] OR ROOM 01 Gender: Male Note Status: Finalized Procedure:            Upper GI endoscopy Indications:          Dysphagia, Abnormal UGI series Providers:            Lucilla Lame MD, MD Referring MD:         Kirstie Peri. Caryn Section, MD (Referring MD) Medicines:            Propofol per Anesthesia Complications:        No immediate complications. Procedure:            Pre-Anesthesia Assessment:                       - Prior to the procedure, a History and Physical was                        performed, and patient medications and allergies were                        reviewed. The patient's tolerance of previous                        anesthesia was also reviewed. The risks and benefits of                        the procedure and the sedation options and risks were                        discussed with the patient. All questions were                        answered, and informed consent was obtained. Prior                        Anticoagulants: The patient has taken no previous                        anticoagulant or antiplatelet agents. ASA Grade                        Assessment: II - A patient with mild systemic disease.                        After reviewing the risks and benefits, the patient was                        deemed in satisfactory condition to undergo the                        procedure.                       After obtaining informed consent, the endoscope was                        passed under direct vision. Throughout the procedure,  the patient's blood pressure, pulse, and oxygen                        saturations were monitored continuously. The was                        introduced through the mouth, and advanced to the        second part of duodenum. The upper GI endoscopy was                        accomplished without difficulty. The patient tolerated                        the procedure well. Findings:      One benign-appearing, intrinsic mild stenosis was found at the       gastroesophageal junction. The stenosis was traversed. A TTS dilator was       passed through the scope. Dilation with a 15-16.5-18 mm balloon dilator       was performed to 18 mm. The dilation site was examined following       endoscope reinsertion and showed complete resolution of luminal       narrowing.      A small hiatal hernia was present.      The stomach was normal.      The examined duodenum was normal. Impression:           - Benign-appearing esophageal stenosis. Dilated.                       - Small hiatal hernia.                       - Normal stomach.                       - Normal examined duodenum.                       - No specimens collected. Recommendation:       - Discharge patient to home.                       - Resume previous diet.                       - Continue present medications.                       - Perform a colonoscopy today. Procedure Code(s):    --- Professional ---                       [phone], Esophagogastroduodenoscopy, flexible, transoral;                        with transendoscopic balloon dilation of esophagus                        (less than 30 mm diameter) Diagnosis Code(s):    --- Professional ---                       R13.10, Dysphagia, unspecified  R93.3, Abnormal findings on diagnostic imaging of other                        parts of digestive tract                       K22.2, Esophageal obstruction CPT copyright 2019 American Medical Association. All rights reserved. The codes documented in this report are preliminary and upon coder review may  be revised to meet current compliance requirements. Lucilla Lame MD, MD 06/13/2019 10:27:19 AM This report has  been signed electronically. Number of Addenda: 0 Note Initiated On: 06/13/2019 10:12 AM Total Procedure Duration: 0 hours 3 minutes 38 seconds  Estimated Blood Loss: Estimated blood loss: none.      [facility]

## 2019-06-13 NOTE — Anesthesia Preprocedure Evaluation (Signed)
Anesthesia Evaluation  Patient identified by MRN, date of birth, ID band Patient awake    Reviewed: Allergy & Precautions, NPO status , Patient's Chart, lab work & pertinent test results  Airway Mallampati: II  TM Distance: >3 FB     Dental   Pulmonary    breath sounds clear to auscultation       Cardiovascular hypertension,  Rhythm:Regular Rate:Normal  HLD   Neuro/Psych  Headaches, Dementia    GI/Hepatic GERD  ,  Endo/Other    Renal/GU Renal disease (congenital renal agenesis)     Musculoskeletal   Abdominal   Peds  Hematology   Anesthesia Other Findings   Reproductive/Obstetrics                             Anesthesia Physical Anesthesia Plan  ASA: III  Anesthesia Plan: MAC   Post-op Pain Management:    Induction: Intravenous  PONV Risk Score and Plan:   Airway Management Planned: Nasal Cannula and Natural Airway  Additional Equipment:   Intra-op Plan:   Post-operative Plan:   Informed Consent: I have reviewed the patients History and Physical, chart, labs and discussed the procedure including the risks, benefits and alternatives for the proposed anesthesia with the patient or authorized representative who has indicated his/her understanding and acceptance.       Plan Discussed with: CRNA  Anesthesia Plan Comments:         Anesthesia Quick Evaluation

## 2019-06-14 ENCOUNTER — Encounter: Payer: Self-pay | Admitting: Gastroenterology

## 2019-06-15 ENCOUNTER — Encounter: Payer: Self-pay | Admitting: Gastroenterology

## 2019-06-16 ENCOUNTER — Ambulatory Visit: Payer: PPO

## 2019-07-13 ENCOUNTER — Other Ambulatory Visit: Payer: Self-pay | Admitting: Family Medicine

## 2019-07-13 DIAGNOSIS — F0390 Unspecified dementia without behavioral disturbance: Secondary | ICD-10-CM

## 2019-07-18 ENCOUNTER — Other Ambulatory Visit: Payer: Self-pay

## 2019-07-18 ENCOUNTER — Ambulatory Visit (INDEPENDENT_AMBULATORY_CARE_PROVIDER_SITE_OTHER): Payer: PPO

## 2019-07-18 VITALS — BP 148/82 | HR 55 | Temp 98.4°F | Ht 72.0 in | Wt 215.2 lb

## 2019-07-18 DIAGNOSIS — Z0000 Encounter for general adult medical examination without abnormal findings: Secondary | ICD-10-CM

## 2019-07-18 NOTE — Progress Notes (Signed)
Subjective:   Luke [name] is a 73 y.o. male who presents for Medicare Annual/Subsequent preventive examination.  Review of Systems:  N/A  Cardiac Risk Factors include: advanced age (>57mn, >>69women);hypertension;male gender     Objective:    Vitals: BP (!) 148/82 (BP Location: Right Arm)   Pulse (!) 55   Temp 98.4 F (36.9 C) (Oral)   Ht 6' (1.829 m)   Wt 215 lb 3.2 oz (97.6 kg)   BMI 29.19 kg/m   Body mass index is 29.19 kg/m.   Advanced Directives 07/18/2019 06/13/2019 03/01/2019 06/02/2018 05/12/2017  Does Patient Have a Medical Advance Directive? No Yes No No Yes  Type of Advance Directive - Living will;Healthcare [place]  Does patient want to make changes to medical advance directive? - No - Patient declined - - -  Copy of [facility] Chart? - No - copy requested - - No - copy requested  Would patient like information on creating a medical advance directive? Yes (MAU/Ambulatory/Procedural Areas - Information given) - - Yes (MAU/Ambulatory/Procedural Areas - Information given) -    Tobacco Social History   Tobacco Use  Smoking Status Never Smoker  Smokeless Tobacco Never Used  Tobacco Comment   smokes a cLexicographerwith friends     Counseling given: Not Answered Comment: smokes a cLexicographerwith friends   Clinical Intake:  Pre-visit preparation completed: Yes  Pain : No/denies pain Pain Score: 0-No pain     Nutritional Status: BMI 25 -29 Overweight Nutritional Risks: None Diabetes: No  How often do you need to have someone help you when you read instructions, pamphlets, or other written materials from your doctor or pharmacy?: 4 - Often(Needs an updated eye exam and prescription.)  Interpreter Needed?: No  Information entered by :: [facility] L[facility]  Past Medical History:  Diagnosis Date  . Acquired absence of kidney 12/29/1988  . Brachial neuritis  04/02/2009  . Claudication ([place] 02/06/2016  . 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 Dysphagia 04/02/2009  . GERD (gastroesophageal reflux disease) 02/06/2016   Schatski ring seen on EGD 07-10-11   . History of MRSA infection 11/18/2007  . Hyperlipidemia, mixed 12/29/1998  . Hypertension 04/02/2009  . Hypogonadism male 02/06/2016  . Insomnia 02/06/2016  . LBP (low back pain) 07/13/2013  . Numbness of toes 02/06/2016  . Palpitations 02/06/2016  . Prediabetes 02/08/2016   A1c=6.2 02/08/16   . [place]state cancer [place]   Seed Implants  . Single kidney   . Vitamin D deficiency 02/06/2016   Past Surgical History:  Procedure Laterality Date  . ABI  07/09/2011   Normal; Left= 1.21, Right=1.00. Triphasic waver forms  . BALLOON DILATION N/A 06/13/2019   Procedure: BALLOON DILATION;  Surgeon: WLucilla Lame MD;  [facility]  Service: Endoscopy;  Laterality: N/A;  . Carotid Doppler Ultrasound  10/18/2012   Minimal soft plague both carotids. No hemodynamically significant stenosis  . CHOLECYSTECTOMY  2001  . COLONOSCOPY WITH PROPOFOL N/A 06/13/2019   Procedure: COLONOSCOPY WITH BIOPSIES;  Surgeon: WLucilla Lame MD;  [facility]  Service: Endoscopy;  Laterality: N/A;  . ESOPHAGOGASTRODUODENOSCOPY (EGD) WITH PROPOFOL N/A 06/13/2019   Procedure: ESOPHAGOGASTRODUODENOSCOPY (EGD) WITH BALLOON DILATION;  Surgeon: WLucilla Lame MD;  [facility] [facility]  Service: Endoscopy;  Laterality: N/A;  . Lumbar Spine X Ray  11/28/2011   Mild DDD L1- L2. Anterior heigt loss of T12-L1  vertebral bodies  . NEPHRECTOMY Right 2001   done by Dr. Eliberto Ivory for Angiomyolipoma  . PENILE PROSTHESIS  REMOVAL  11/28/2013   Dr. Eliberto Ivory  . PENILE PROSTHESIS IMPLANT  2008  . Photovaporization of prostate with green light laser  01/19/2012   Dr. Rogers Blocker  . POLYPECTOMY N/A 06/13/2019   Procedure: POLYPECTOMY;  Surgeon: Lucilla Lame, MD;  Location: [place];  Service: Endoscopy;   Laterality: N/A;  . [place]  . UPPER GI ENDOSCOPY  07/10/2011   Done by Dr. Sharen Counter. Revealed Schatski rings   Family History  Problem Relation Age of Onset  . Heart attack Mother   . Hypertension Father   . Prostate cancer Neg Hx   . Bladder Cancer Neg Hx   . Kidney cancer Neg Hx    Social History   Socioeconomic History  . Marital status: Married    Spouse name: Not on file  . Number of children: 2  . Years of education: Not on file  . Highest education level: 10th grade  Occupational History  . Occupation: Retired  Scientific laboratory technician  . Financial resource strain: Not hard at all  . Food insecurity    Worry: Never true    Inability: Never true  . Transportation needs    Medical: No    Non-medical: No  Tobacco Use  . Smoking status: Never Smoker  . Smokeless tobacco: Never Used  . Tobacco comment: smokes a Lexicographer with friends  Substance and Sexual Activity  . Alcohol use: Yes    Alcohol/week: 0.0 standard drinks    Comment: occasional / monthly  . Drug use: No  . Sexual activity: Not Currently  Lifestyle  . Physical activity    Days per week: 0 days    Minutes per session: 0 min  . Stress: Only a little  Relationships  . Social Herbalist on phone: Patient refused    Gets together: Patient refused    Attends religious service: Patient refused    Active member of club or organization: Patient refused    Attends meetings of clubs or organizations: Patient refused    Relationship status: Patient refused  Other Topics Concern  . Not on file  Social History Narrative  . Not on file    Outpatient Encounter Medications as of 07/18/2019  Medication Sig  . Cholecalciferol (VITAMIN D) 2000 units CAPS Take 1 capsule by mouth daily. occasionally  . hydrochlorothiazide (HYDRODIURIL) 25 MG tablet Take 1 tablet (25 mg total) by mouth daily.  . Multiple Vitamin (MULTIVITAMIN) tablet Take 1 tablet by mouth daily. occasionally  .  rivastigmine (EXELON) 6 MG capsule TAKE 1 CAPSULE BY MOUTH TWICE A DAY FOR 14 DAYS THEN INCREASE TO 2 CAPSULES BY MOUTH DAILY (Patient taking differently: Take 6 mg by mouth 2 (two) times daily. )  . sertraline (ZOLOFT) 100 MG tablet Take 1 tablet (100 mg total) by mouth as needed.  . sildenafil (VIAGRA) 100 MG tablet Take 100 mg by mouth as directed.  Marland Kitchen amLODipine (NORVASC) 10 MG tablet Take 1 tablet (10 mg total) by mouth daily. (Patient not taking: Reported on 04/19/2019)  . losartan (COZAAR) 100 MG tablet Please specify directions, refills and quantity (Patient not taking: Reported on 03/01/2019)  . losartan-hydrochlorothiazide (HYZAAR) 100-12.5 MG tablet Take 1 tablet by mouth daily.  . Na Sulfate-K Sulfate-Mg Sulf (SUPREP BOWEL PREP KIT) 17.5-3.13-1.6 GM/177ML SOLN Take 1 kit by mouth as directed. (Patient not taking: Reported on  07/18/2019)  . omeprazole (PRILOSEC) 40 MG capsule Take 1 capsule (40 mg total) by mouth daily. (Patient not taking: Reported on 07/18/2019)  . traMADol (ULTRAM) 50 MG tablet Take 1 tablet (50 mg total) by mouth every 12 (twelve) hours as needed for moderate pain. (Patient not taking: Reported on 04/19/2019)   No facility-administered encounter medications on file as of 07/18/2019.     Activities of Daily Living In your present state of health, do you have any difficulty performing the following activities: 07/18/2019 06/13/2019  Hearing? Y N  Comment Has hearing aids but does not wear them. -  Vision? Y N  Comment Due to floaters and needing a new eye glass prescription. Pt plans to schedule an eye exam this year. -  Difficulty concentrating or making decisions? Tempie Donning  Comment Currently on Rx for this. -  Walking or climbing stairs? Y N  Comment Due to knee pains. -  Dressing or bathing? N N  Doing errands, shopping? N -  Preparing Food and eating ? N -  Using the Toilet? N -  In the past six months, have you accidently leaked urine? N -  Do you have problems with  loss of bowel control? N -  Managing your Medications? Y -  Comment Wife manages meds. -  Managing your Finances? Y -  Comment Wife manages finances. -  Housekeeping or managing your Housekeeping? Y -  Comment Wife does the cleaning. -  Some recent data might be hidden    Patient Care Team: Birdie Sons, MD as PCP - General (Family Medicine) Abbie Sons, MD as Consulting Physician (Urology) Lucilla Lame, MD as Consulting Physician (Gastroenterology)   Assessment:   This is a routine wellness examination for [place].  Exercise Activities and Dietary recommendations Current Exercise Habits: The patient does not participate in regular exercise at present, Exercise limited by: orthopedic condition(s)  Goals    . DIET - INCREASE WATER INTAKE     Recommend increasing water intake to 4 glasses a day.     . Increase water intake     Recommend increasing water intake to 2-3 glasses a day.       Fall Risk: Fall Risk  07/18/2019 06/02/2018 05/12/2017 05/12/2017 02/07/2016  Falls in the past year? 0 No No No No    FALL RISK PREVENTION PERTAINING TO THE HOME:  Any stairs in or around the home? Yes  If so, are there any without handrails? No   Home free of loose throw rugs in walkways, pet beds, electrical cords, etc? Yes  Adequate lighting in your home to reduce risk of falls? Yes   ASSISTIVE DEVICES UTILIZED TO PREVENT FALLS:  Life alert? No  Use of a cane, walker or w/c? No  Grab bars in the bathroom? No  Shower chair or bench in shower? Yes  Elevated toilet seat or a handicapped toilet? Yes   TIMED UP AND GO:  Was the test performed? No .    Depression Screen PHQ 2/9 Scores 07/18/2019 06/02/2018 05/12/2017 05/12/2017  PHQ - 2 Score _0 PHQ- 9 Score _1 -    Cognitive Function: Declined today.      6CIT Screen 05/12/2017  What Year? 4 points  What month? 3 points  What time? 0 points  Count back from 20 0 points  Months in reverse 4 points  Repeat  phrase 10 points  Total Score 21    Immunization History  Administered Date(s) Administered  . Influenza, High Dose Seasonal PF 11/30/2015, 09/09/2016, 09/01/2017, 09/17/2018  . Influenza-Unspecified 11/28/2013  . Pneumococcal Conjugate-13 02/07/2016  . Pneumococcal Polysaccharide-23 04/30/2010, 05/12/2017  . Tdap 04/30/2010    Qualifies for Shingles Vaccine? Yes . Due for Shingrix. Education has been provided regarding the importance of this vaccine. Pt has been advised to call insurance company to determine out of pocket expense. Advised may also receive vaccine at local pharmacy or Health Dept. Verbalized acceptance and understanding.  Tdap: Up to date  Flu Vaccine: Up to date  Pneumococcal Vaccine: Completed series  Screening Tests Health Maintenance  Topic Date Due  . INFLUENZA VACCINE  07/30/2019  . TETANUS/TDAP  04/30/2020  . COLONOSCOPY  06/12/2024  . Hepatitis C Screening  Completed  . PNA vac Low Risk Adult  Completed   Cancer Screenings:  Colorectal Screening: Completed 06/13/19. Repeat every 5 years.  Lung Cancer Screening: (Low Dose CT Chest recommended if Age 63-80 years, 30 pack-year currently smoking OR have quit w/in 15years.) does not qualify.   Additional Screening:  Hepatitis C Screening: Up to date  Vision Screening: Recommended annual ophthalmology exams for early detection of glaucoma and other disorders of the eye.  Dental Screening: Recommended annual dental exams for proper oral hygiene  Community Resource Referral:  CRR required this visit?  No        Plan:  I have personally reviewed and addressed the Medicare Annual Wellness questionnaire and have noted the following in the patient's chart:  A. Medical and social history B. Use of alcohol, tobacco or illicit drugs  C. Current medications and supplements D. Functional ability and status E.  Nutritional status F.  Physical activity G. Advance directives H. List of other physicians  I.  Hospitalizations, surgeries, and ER visits in previous 12 months J.  [place] such as hearing and vision if needed, cognitive and depression L. Referrals and appointments   In addition, I have reviewed and discussed with patient certain preventive protocols, quality metrics, and best practice recommendations. A written personalized care plan for preventive services as well as general preventive health recommendations were provided to patient.   Glendora Score, LPN  7/70/3403 Nurse Health Advisor   Nurse Notes: None.

## 2019-07-18 NOTE — Patient Instructions (Signed)
Mr. Luke [name] , Thank you for taking time to come for your Medicare Wellness Visit. I appreciate your ongoing commitment to your health goals. Please review the following plan we discussed and let me know if I can assist you in the future.   Screening recommendations/referrals: Colonoscopy: Up to date, due 04/2024 Recommended yearly ophthalmology/optometry visit for glaucoma screening and checkup Recommended yearly dental visit for hygiene and checkup  Vaccinations: Influenza vaccine: Up to date Pneumococcal vaccine: Completed series Tdap vaccine: Up to date, due 04/2020 Shingles vaccine: Pt declines today.     Advanced directives: Advance directive discussed with you today. I have provided a copy for you to complete at home and have notarized. Once this is complete please bring a copy in to our office so we can scan it into your chart.  Conditions/risks identified: Continue to increase water intake to 6-8 8 oz glasses a day.   Next appointment: 10/03/19 @ 2:00 PM with Dr Caryn Section.   Preventive Care 72 Years and Older, Male Preventive care refers to lifestyle choices and visits with your health care provider that can promote health and wellness. What does preventive care include?  A yearly physical exam. This is also called an annual well check.  Dental exams once or twice a year.  Routine eye exams. Ask your health care provider how often you should have your eyes checked.  Personal lifestyle choices, including:  Daily care of your teeth and gums.  Regular physical activity.  Eating a healthy diet.  Avoiding tobacco and drug use.  Limiting alcohol use.  Practicing safe sex.  Taking low doses of aspirin every day.  Taking vitamin and mineral supplements as recommended by your health care provider. What happens during an annual well check? The services and screenings done by your health care provider during your annual well check will depend on your age, overall health,  lifestyle risk factors, and family history of disease. Counseling  Your health care provider may ask you questions about your:  Alcohol use.  Tobacco use.  Drug use.  Emotional well-being.  Home and relationship well-being.  Sexual activity.  Eating habits.  History of falls.  Memory and ability to understand (cognition).  Work and work Statistician. Screening  You may have the following tests or measurements:  Height, weight, and BMI.  Blood pressure.  Lipid and cholesterol levels. These may be checked every 5 years, or more frequently if you are over 75 years old.  Skin check.  Lung cancer screening. You may have this screening every year starting at age 72 if you have a 30-pack-year history of smoking and currently smoke or have quit within the past 15 years.  Fecal occult blood test (FOBT) of the stool. You may have this test every year starting at age 40.  Flexible sigmoidoscopy or colonoscopy. You may have a sigmoidoscopy every 5 years or a colonoscopy every 10 years starting at age 70.  Prostate cancer screening. Recommendations will vary depending on your family history and other risks.  Hepatitis C blood test.  Hepatitis B blood test.  Sexually transmitted disease (STD) testing.  Diabetes screening. This is done by checking your blood sugar (glucose) after you have not eaten for a while (fasting). You may have this done every 1-3 years.  Abdominal aortic aneurysm (AAA) screening. You may need this if you are a current or former smoker.  Osteoporosis. You may be screened starting at age 38 if you are at high risk. Talk with your health  care provider about your test results, treatment options, and if necessary, the need for more tests. Vaccines  Your health care provider may recommend certain vaccines, such as:  Influenza vaccine. This is recommended every year.  Tetanus, diphtheria, and acellular pertussis (Tdap, Td) vaccine. You may need a Td booster  every 10 years.  Zoster vaccine. You may need this after age 70.  Pneumococcal 13-valent conjugate (PCV13) vaccine. One dose is recommended after age 3.  Pneumococcal polysaccharide (PPSV23) vaccine. One dose is recommended after age 72. Talk to your health care provider about which screenings and vaccines you need and how often you need them. This information is not intended to replace advice given to you by your health care provider. Make sure you discuss any questions you have with your health care provider. Document Released: 01/11/2016 Document Revised: 09/03/2016 Document Reviewed: 10/16/2015 Elsevier Interactive Patient Education  2017 [place] Prevention in the Home Falls can cause injuries. They can happen to people of all ages. There are many things you can do to make your home safe and to help prevent falls. What can I do on the outside of my home?  Regularly fix the edges of walkways and driveways and fix any cracks.  Remove anything that might make you trip as you walk through a door, such as a raised step or threshold.  Trim any bushes or trees on the path to your home.  Use bright outdoor lighting.  Clear any walking paths of anything that might make someone trip, such as rocks or tools.  Regularly check to see if handrails are loose or broken. Make sure that both sides of any steps have handrails.  Any raised decks and porches should have guardrails on the edges.  Have any leaves, snow, or ice cleared regularly.  Use sand or salt on walking paths during winter.  Clean up any spills in your garage right away. This includes oil or grease spills. What can I do in the bathroom?  Use night lights.  Install grab bars by the toilet and in the tub and shower. Do not use towel bars as grab bars.  Use non-skid mats or decals in the tub or shower.  If you need to sit down in the shower, use a plastic, non-slip stool.  Keep the floor dry. Clean up any  water that spills on the floor as soon as it happens.  Remove soap buildup in the tub or shower regularly.  Attach bath mats securely with double-sided non-slip rug tape.  Do not have throw rugs and other things on the floor that can make you trip. What can I do in the bedroom?  Use night lights.  Make sure that you have a light by your bed that is easy to reach.  Do not use any sheets or blankets that are too big for your bed. They should not hang down onto the floor.  Have a firm chair that has side arms. You can use this for support while you get dressed.  Do not have throw rugs and other things on the floor that can make you trip. What can I do in the kitchen?  Clean up any spills right away.  Avoid walking on wet floors.  Keep items that you use a lot in easy-to-reach places.  If you need to reach something above you, use a strong step stool that has a grab bar.  Keep electrical cords out of the way.  Do not use floor  polish or wax that makes floors slippery. If you must use wax, use non-skid floor wax.  Do not have throw rugs and other things on the floor that can make you trip. What can I do with my stairs?  Do not leave any items on the stairs.  Make sure that there are handrails on both sides of the stairs and use them. Fix handrails that are broken or loose. Make sure that handrails are as long as the stairways.  Check any carpeting to make sure that it is firmly attached to the stairs. Fix any carpet that is loose or worn.  Avoid having throw rugs at the top or bottom of the stairs. If you do have throw rugs, attach them to the floor with carpet tape.  Make sure that you have a light switch at the top of the stairs and the bottom of the stairs. If you do not have them, ask someone to add them for you. What else can I do to help prevent falls?  Wear shoes that:  Do not have high heels.  Have rubber bottoms.  Are comfortable and fit you well.  Are closed  at the toe. Do not wear sandals.  If you use a stepladder:  Make sure that it is fully opened. Do not climb a closed stepladder.  Make sure that both sides of the stepladder are locked into place.  Ask someone to hold it for you, if possible.  Clearly mark and make sure that you can see:  Any grab bars or handrails.  First and last steps.  Where the edge of each step is.  Use tools that help you move around (mobility aids) if they are needed. These include:  Canes.  Walkers.  Scooters.  Crutches.  Turn on the lights when you go into a dark area. Replace any light bulbs as soon as they burn out.  Set up your furniture so you have a clear path. Avoid moving your furniture around.  If any of your floors are uneven, fix them.  If there are any pets around you, be aware of where they are.  Review your medicines with your doctor. Some medicines can make you feel dizzy. This can increase your chance of falling. Ask your doctor what other things that you can do to help prevent falls. This information is not intended to replace advice given to you by your health care provider. Make sure you discuss any questions you have with your health care provider. Document Released: 10/11/2009 Document Revised: 05/22/2016 Document Reviewed: 01/19/2015 Elsevier Interactive Patient Education  2017 [employer].

## 2019-07-27 ENCOUNTER — Ambulatory Visit (INDEPENDENT_AMBULATORY_CARE_PROVIDER_SITE_OTHER): Payer: PPO | Admitting: Family Medicine

## 2019-07-27 ENCOUNTER — Other Ambulatory Visit: Payer: Self-pay

## 2019-07-27 VITALS — BP 142/90 | HR 55 | Temp 98.2°F | Wt 211.0 lb

## 2019-07-27 DIAGNOSIS — F0390 Unspecified dementia without behavioral disturbance: Secondary | ICD-10-CM

## 2019-07-27 DIAGNOSIS — N419 Inflammatory disease of prostate, unspecified: Secondary | ICD-10-CM

## 2019-07-27 DIAGNOSIS — R319 Hematuria, unspecified: Secondary | ICD-10-CM | POA: Diagnosis not present

## 2019-07-27 DIAGNOSIS — C61 Malignant neoplasm of prostate: Secondary | ICD-10-CM | POA: Diagnosis not present

## 2019-07-27 DIAGNOSIS — N50819 Testicular pain, unspecified: Secondary | ICD-10-CM | POA: Diagnosis not present

## 2019-07-27 MED ORDER — TRAMADOL HCL 50 MG PO TABS: 50.0000 mg | ORAL_TABLET | Freq: Three times a day (TID) | ORAL | 1 refills | Status: AC | PRN

## 2019-07-27 MED ORDER — SULFAMETHOXAZOLE-TRIMETHOPRIM 400-80 MG PO TABS: 1.0000 | ORAL_TABLET | Freq: Two times a day (BID) | ORAL | 0 refills | Status: DC

## 2019-07-27 NOTE — Progress Notes (Signed)
Luke [name]  MRN: [id number] DOB: Apr 17, 1946  Subjective:  HPI   The patient is a 73 year old male patient of Dr Maralyn Sago.  He presents today with a history of testicular pain on and off for several years.  He apparently sees urology and in the past he has had this evaluated and told he had a cyst in the testicle.  He has history of insertion and removal of penile implant due to the implant "breaking".   He states that he comes in today because the pain is much worse than it has been being.   He admits to pain and swelling of the testicle, painful urination, and hematuria. This is admittdly a chronic problem but it seems to be worsening lately. Patient Active Problem List   Diagnosis Date Noted  . Personal history of colonic polyps   . Polyp of transverse colon   . Problems with swallowing and mastication   . Abnormal barium swallow   . Stricture and stenosis of esophagus   . Hydrocele in adult 04/19/2019  . Hearing difficulty 05/12/2017  . Dementia ([place]) 10/16/2016  . Inguinal pain 05/12/2016  . Erectile dysfunction after prostate brachytherapy 02/12/2016  . Solitary kidney 02/12/2016  . Prediabetes 02/08/2016  . GERD (gastroesophageal reflux disease) 02/06/2016  . Headache 02/06/2016  . Hypogonadism male 02/06/2016  . Insomnia 02/06/2016  . Numbness of toes 02/06/2016  . Palpitations 02/06/2016  . 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 Vitamin D deficiency 02/06/2016  . LBP (low back pain) 07/13/2013  . Dysphagia 04/02/2009  . Hypertension 04/02/2009  . Brachial neuritis 04/02/2009  . History of MRSA infection 11/18/2007  . Hyperlipidemia, mixed 12/29/1998  . Acquired absence of kidney 12/29/1988    Past Medical History:  Diagnosis Date  . Acquired absence of kidney 12/29/1988  . Brachial neuritis 04/02/2009  . Claudication ([place]) 02/06/2016  . 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 Dysphagia 04/02/2009  . GERD (gastroesophageal reflux  disease) 02/06/2016   Schatski ring seen on EGD 07-10-11   . History of MRSA infection 11/18/2007  . Hyperlipidemia, mixed 12/29/1998  . Hypertension 04/02/2009  . Hypogonadism male 02/06/2016  . Insomnia 02/06/2016  . LBP (low back pain) 07/13/2013  . Numbness of toes 02/06/2016  . Palpitations 02/06/2016  . Prediabetes 02/08/2016   A1c=6.2 02/08/16   . Prostate cancer ([place]) 2014   Seed Implants  . Single kidney   . Vitamin D deficiency 02/06/2016    Social History   Socioeconomic History  . Marital status: Married    Spouse name: Not on file  . Number of children: 2  . Years of education: Not on file  . Highest education level: 10th grade  Occupational History  . Occupation: Retired  Scientific laboratory technician  . Financial resource strain: Not hard at all  . Food insecurity    Worry: Never true    Inability: Never true  . Transportation needs    Medical: No    Non-medical: No  Tobacco Use  . Smoking status: Never Smoker  . Smokeless tobacco: Never Used  . Tobacco comment: smokes a Lexicographer with friends  Substance and Sexual Activity  . Alcohol use: Yes    Alcohol/week: 0.0 standard drinks    Comment: occasional / monthly  . Drug use: No  . Sexual activity: Not Currently  Lifestyle  . Physical activity    Days per week: 0 days    Minutes per session: 0 min  .  Stress: Only a little  Relationships  . Social Herbalist on phone: Patient refused    Gets together: Patient refused    Attends religious service: Patient refused    Active member of club or organization: Patient refused    Attends meetings of clubs or organizations: Patient refused    Relationship status: Patient refused  . Intimate partner violence    Fear of current or ex partner: Patient refused    Emotionally abused: Patient refused    Physically abused: Patient refused    Forced sexual activity: Patient refused  Other Topics Concern  . Not on file  Social History Narrative  . Not on file     Outpatient Encounter Medications as of 07/27/2019  Medication Sig  . Cholecalciferol (VITAMIN D) 2000 units CAPS Take 1 capsule by mouth daily. occasionally  . hydrochlorothiazide (HYDRODIURIL) 25 MG tablet Take 1 tablet (25 mg total) by mouth daily.  Marland Kitchen losartan-hydrochlorothiazide (HYZAAR) 100-12.5 MG tablet Take 1 tablet by mouth daily.  . Multiple Vitamin (MULTIVITAMIN) tablet Take 1 tablet by mouth daily. occasionally  . rivastigmine (EXELON) 6 MG capsule TAKE 1 CAPSULE BY MOUTH TWICE A DAY FOR 14 DAYS THEN INCREASE TO 2 CAPSULES BY MOUTH DAILY (Patient taking differently: Take 6 mg by mouth 2 (two) times daily. )  . sertraline (ZOLOFT) 100 MG tablet Take 1 tablet (100 mg total) by mouth as needed.  . sildenafil (VIAGRA) 100 MG tablet Take 100 mg by mouth as directed.  Marland Kitchen amLODipine (NORVASC) 10 MG tablet Take 1 tablet (10 mg total) by mouth daily. (Patient not taking: Reported on 04/19/2019)  . losartan (COZAAR) 100 MG tablet Please specify directions, refills and quantity (Patient not taking: Reported on 03/01/2019)  . Na Sulfate-K Sulfate-Mg Sulf (SUPREP BOWEL PREP KIT) 17.5-3.13-1.6 GM/177ML SOLN Take 1 kit by mouth as directed. (Patient not taking: Reported on 07/18/2019)  . omeprazole (PRILOSEC) 40 MG capsule Take 1 capsule (40 mg total) by mouth daily. (Patient not taking: Reported on 07/18/2019)  . traMADol (ULTRAM) 50 MG tablet Take 1 tablet (50 mg total) by mouth every 12 (twelve) hours as needed for moderate pain. (Patient not taking: Reported on 04/19/2019)   No facility-administered encounter medications on file as of 07/27/2019.     No Known Allergies  Review of Systems  Constitutional: Negative for fever.  Eyes: Negative.   Respiratory: Negative for cough and wheezing.   Cardiovascular: Negative.   Gastrointestinal: Negative.   Genitourinary: Positive for dysuria and hematuria. Negative for frequency and urgency.       Testicular pain  Skin: Negative.   Neurological:        Memory loss.  Endo/Heme/Allergies: Negative.   Psychiatric/Behavioral: Positive for memory loss.    Objective:  BP (!) 142/90 (BP Location: Right Arm, Patient Position: Sitting, Cuff Size: Normal)   Pulse (!) 55   Temp 98.2 F (36.8 C) (Oral)   Wt 211 lb (95.7 kg)   SpO2 99%   BMI 28.62 kg/m   Physical Exam  Constitutional: He is well-developed, well-nourished, and in no distress.  HENT:  Head: Normocephalic and atraumatic.  Eyes: Conjunctivae are normal. No scleral icterus.  Neck: No thyromegaly present.  Cardiovascular: Normal rate and regular rhythm.  Pulmonary/Chest: Effort normal and breath sounds normal.  Abdominal: Soft.  Genitourinary:    Penis normal.     Genitourinary Comments: Mildly tender in left testicla.   Neurological: GCS score is 15.  Skin: Skin is warm.  Psychiatric:  Mood and affect normal.    Assessment and Plan :  1. Prostatitis, unspecified prostatitis type/Epidydimitis Treat with Septra DS and f/u 2 weeks with dr Caryn Section.More than 50% 25 minute visit spent in counseling or coordination of care  - Urine Culture  2. Hematuria, unspecified type  - Urine Culture  3. Testicular pain See #1.  4. Dementia without behavioral disturbance, unspecified dementia type ([place]) Progressive issue. Wife is with pt today to help with history.  5. Prostate cancer ([place]) Had seeds in 2010.   I have done the exam and reviewed the chart and it is accurate to the best of my knowledge. Development worker, community has been used and  any errors in dictation or transcription are unintentional. Miguel Aschoff M.D. [facility]

## 2019-07-29 LAB — URINE CULTURE: Organism ID, Bacteria: NO GROWTH

## 2019-08-08 ENCOUNTER — Ambulatory Visit (INDEPENDENT_AMBULATORY_CARE_PROVIDER_SITE_OTHER): Payer: PPO | Admitting: Family Medicine

## 2019-08-08 ENCOUNTER — Other Ambulatory Visit: Payer: Self-pay

## 2019-08-08 ENCOUNTER — Other Ambulatory Visit: Payer: Self-pay | Admitting: Family Medicine

## 2019-08-08 ENCOUNTER — Encounter: Payer: Self-pay | Admitting: Family Medicine

## 2019-08-08 VITALS — BP 156/96 | HR 74 | Temp 98.6°F | Wt 203.0 lb

## 2019-08-08 DIAGNOSIS — Z125 Encounter for screening for malignant neoplasm of prostate: Secondary | ICD-10-CM | POA: Diagnosis not present

## 2019-08-08 DIAGNOSIS — R634 Abnormal weight loss: Secondary | ICD-10-CM | POA: Diagnosis not present

## 2019-08-08 DIAGNOSIS — R630 Anorexia: Secondary | ICD-10-CM

## 2019-08-08 DIAGNOSIS — R5383 Other fatigue: Secondary | ICD-10-CM | POA: Diagnosis not present

## 2019-08-08 DIAGNOSIS — N419 Inflammatory disease of prostate, unspecified: Secondary | ICD-10-CM

## 2019-08-08 DIAGNOSIS — E559 Vitamin D deficiency, unspecified: Secondary | ICD-10-CM | POA: Diagnosis not present

## 2019-08-08 DIAGNOSIS — R7303 Prediabetes: Secondary | ICD-10-CM | POA: Diagnosis not present

## 2019-08-08 DIAGNOSIS — R1010 Upper abdominal pain, unspecified: Secondary | ICD-10-CM | POA: Diagnosis not present

## 2019-08-08 NOTE — Patient Instructions (Signed)
.   Please review the attached list of medications and notify my office if there are any errors.   . Please bring all of your medications to every appointment so we can make sure that our medication list is the same as yours.   . We will have flu vaccines available after Labor Day. Please go to your pharmacy or call the office in early September to schedule you flu shot.   

## 2019-08-08 NOTE — Progress Notes (Signed)
Patient: Luke [name] Male    DOB: 1946-11-10   73 y.o.   MRN: [id number] Visit Date: 08/08/2019  Today's Provider: Lelon Huh, MD   Chief Complaint  Patient presents with  . Follow-up    Two week follow up on Prostatitis.   Subjective:     HPI   Follow up for Prostatitis  The patient was last seen for this 07/27/2019 by Dr. Rosanna Randy Changes made at last visit include Started 10 days Septra DS, Urine culture negative for infection. His wife report several similar episodes over the last year.   He reports excellent compliance with treatment. He feels that condition is Improved. He is not complaining of groin pain anymore but is feeling worse overall.  Since last visit has been feeling weak and fatigued, having vague abdominal discomfort, no appetites, is not eating. His wife also reports that she stopped giving him his PPI after having esophogeal dilation in June, but had no GI sx until the last week or two. .   Wt Readings from Last 3 Encounters:  08/08/19 203 lb (92.1 kg)  07/27/19 211 lb (95.7 kg)  07/18/19 215 lb 3.2 oz (97.6 kg)    ------------------------------------------------------------------------------------     No Known Allergies   Current Outpatient Medications:  .  hydrochlorothiazide (MICROZIDE) 12.5 MG capsule, Take 12.5 mg by mouth daily., Disp: , Rfl:  .  rivastigmine (EXELON) 6 MG capsule, TAKE 1 CAPSULE BY MOUTH TWICE A DAY FOR 14 DAYS THEN INCREASE TO 2 CAPSULES BY MOUTH DAILY (Patient taking differently: Take 6 mg by mouth 2 (two) times daily. ), Disp: 180 capsule, Rfl: 4 .  sertraline (ZOLOFT) 100 MG tablet, Take 1 tablet (100 mg total) by mouth as needed., Disp: 90 tablet, Rfl: 3 .  traMADol (ULTRAM) 50 MG tablet, Take 1 tablet (50 mg total) by mouth every 12 (twelve) hours as needed for moderate pain., Disp: 10 tablet, Rfl: 0 .  amLODipine (NORVASC) 10 MG tablet, Take 1 tablet (10 mg total) by mouth daily. (Patient not taking: Reported  on 04/19/2019), Disp: 90 tablet, Rfl: 4 .  Cholecalciferol (VITAMIN D) 2000 units CAPS, Take 1 capsule by mouth daily. occasionally, Disp: , Rfl:  .  hydrochlorothiazide (HYDRODIURIL) 25 MG tablet, Take 1 tablet (25 mg total) by mouth daily., Disp: 90 tablet, Rfl: 2 .  losartan (COZAAR) 100 MG tablet, Please specify directions, refills and quantity (Patient not taking: Reported on 03/01/2019), Disp: 90 tablet, Rfl: 3 .  losartan-hydrochlorothiazide (HYZAAR) 100-12.5 MG tablet, Take 1 tablet by mouth daily., Disp: , Rfl:  .  Multiple Vitamin (MULTIVITAMIN) tablet, Take 1 tablet by mouth daily. occasionally, Disp: , Rfl:  .  Na Sulfate-K Sulfate-Mg Sulf (SUPREP BOWEL PREP KIT) 17.5-3.13-1.6 GM/177ML SOLN, Take 1 kit by mouth as directed. (Patient not taking: Reported on 07/18/2019), Disp: 1 Bottle, Rfl: 0 .  omeprazole (PRILOSEC) 40 MG capsule, Take 1 capsule (40 mg total) by mouth daily. (Patient not taking: Reported on 07/18/2019), Disp: 30 capsule, Rfl: 3 .  sildenafil (VIAGRA) 100 MG tablet, Take 100 mg by mouth as directed., Disp: , Rfl:  .  sulfamethoxazole-trimethoprim (BACTRIM) 400-80 MG tablet, Take 1 tablet by mouth 2 (two) times daily. (Patient not taking: Reported on 08/08/2019), Disp: 20 tablet, Rfl: 0  Review of Systems  Constitutional: Positive for appetite change (Pt reports not having an appetite), fatigue and unexpected weight change. Negative for activity change, chills, diaphoresis and fever.  Respiratory: Negative.   Gastrointestinal:  Positive for abdominal pain. Negative for abdominal distention, anal bleeding, blood in stool, constipation, diarrhea, nausea, rectal pain and vomiting.  Musculoskeletal: Negative.   Neurological: Positive for weakness and light-headedness. Negative for dizziness and headaches.    Social History   Tobacco Use  . Smoking status: Never Smoker  . Smokeless tobacco: Never Used  . Tobacco comment: smokes a Lexicographer with friends  Substance  Use Topics  . Alcohol use: Yes    Alcohol/week: 0.0 standard drinks    Comment: occasional / monthly      Objective:   BP (!) 156/96 (BP Location: Right Arm, Patient Position: Sitting, Cuff Size: Large)   Pulse 74   Temp 98.6 F (37 C) (Oral)   Wt 203 lb (92.1 kg)   BMI 27.53 kg/m  Vitals:   08/08/19 1107  BP: (!) 156/96  Pulse: 74  Temp: 98.6 F (37 C)  TempSrc: Oral  Weight: 203 lb (92.1 kg)     Physical Exam  General Appearance:    Alert, cooperative, no distress  Eyes:    PERRL, conjunctiva/corneas clear, slightly jaundiced, EOM's intact       Lungs:     Clear to auscultation bilaterally, respirations unlabored  Heart:    Regular rate and rhythm  Abdomen:   bowel sounds present and normal in all 4 quadrants, soft, round, nontender or nondistended. No CVA tenderness         Assessment & Plan    1. Prostatitis, unspecified prostatitis type Symptoms resolved, but has had several episodes this year. They do not want to return to Dr. Eliberto Ivory. Consider referral to another urologist.   2. Other fatigue Onset since starting antibiotic for prostatitis by Dr. Rosanna Randy  - Comprehensive metabolic panel - Lipid panel - TSH - CBC - Amylase  3. Pain of upper abdomen  - Amylase  4. Loss of appetite   5. Unexplained weight loss Possible adverse effect from septra, which he has not finished. See how labs look.   6. Prediabetes Due to check - Hemoglobin A1c  7. Prostate cancer screening  - PSA Total (Reflex To Free)  8. Vitamin D deficiency Due to check - VITAMIN D 25 Hydroxy (Vit-D Deficiency, Fractures)      Lelon Huh, MD  [facility]

## 2019-08-09 ENCOUNTER — Other Ambulatory Visit: Payer: Self-pay | Admitting: Family Medicine

## 2019-08-09 DIAGNOSIS — R1010 Upper abdominal pain, unspecified: Secondary | ICD-10-CM

## 2019-08-09 DIAGNOSIS — R630 Anorexia: Secondary | ICD-10-CM

## 2019-08-09 LAB — COMPREHENSIVE METABOLIC PANEL
ALT: 7 IU/L (ref 0–44)
AST: 16 IU/L (ref 0–40)
Albumin/Globulin Ratio: 1.7 (ref 1.2–2.2)
Albumin: 4.5 g/dL (ref 3.7–4.7)
Alkaline Phosphatase: 82 IU/L (ref 39–117)
BUN/Creatinine Ratio: 10 (ref 10–24)
BUN: 14 mg/dL (ref 8–27)
Bilirubin Total: 0.7 mg/dL (ref 0.0–1.2)
CO2: 24 mmol/L (ref 20–29)
Calcium: 10.4 mg/dL — ABNORMAL HIGH (ref 8.6–10.2)
Chloride: 101 mmol/L (ref 96–106)
Creatinine, Ser: 1.45 mg/dL — ABNORMAL HIGH (ref 0.76–1.27)
GFR calc Af Amer: 55 mL/min/{1.73_m2} — ABNORMAL LOW (ref >59)
GFR calc non Af Amer: 48 mL/min/{1.73_m2} — ABNORMAL LOW (ref >59)
Globulin, Total: 2.6 g/dL (ref 1.5–4.5)
Glucose: 90 mg/dL (ref 65–99)
Potassium: 4.1 mmol/L (ref 3.5–5.2)
Sodium: 138 mmol/L (ref 134–144)
Total Protein: 7.1 g/dL (ref 6.0–8.5)

## 2019-08-09 LAB — PSA TOTAL (REFLEX TO FREE): Prostate Specific Ag, Serum: 0.3 ng/mL (ref 0.0–4.0)

## 2019-08-09 LAB — CBC
Hematocrit: 41.4 % (ref 37.5–51.0)
Hemoglobin: 13.8 g/dL (ref 13.0–17.7)
MCH: 21.4 pg — ABNORMAL LOW (ref 26.6–33.0)
MCHC: 33.3 g/dL (ref 31.5–35.7)
MCV: 64 fL — ABNORMAL LOW (ref 79–97)
Platelets: 287 10*3/uL (ref 150–450)
RBC: 6.44 x10E6/uL — ABNORMAL HIGH (ref 4.14–5.80)
RDW: 15.1 % (ref 11.6–15.4)
WBC: 4.9 10*3/uL (ref 3.4–10.8)

## 2019-08-09 LAB — VITAMIN D 25 HYDROXY (VIT D DEFICIENCY, FRACTURES): Vit D, 25-Hydroxy: 29.6 ng/mL — ABNORMAL LOW (ref 30.0–100.0)

## 2019-08-09 LAB — TSH: TSH: 2.02 u[IU]/mL (ref 0.450–4.500)

## 2019-08-09 LAB — LIPID PANEL
Chol/HDL Ratio: 3.7 ratio (ref 0.0–5.0)
Cholesterol, Total: 170 mg/dL (ref 100–199)
HDL: 46 mg/dL (ref >39)
LDL Calculated: 98 mg/dL (ref 0–99)
Triglycerides: 128 mg/dL (ref 0–149)
VLDL Cholesterol Cal: 26 mg/dL (ref 5–40)

## 2019-08-09 LAB — HEMOGLOBIN A1C
Est. average glucose Bld gHb Est-mCnc: 123 mg/dL
Hgb A1c MFr Bld: 5.9 % — ABNORMAL HIGH (ref 4.8–5.6)

## 2019-08-09 LAB — AMYLASE: Amylase: 155 U/L — ABNORMAL HIGH (ref 31–110)

## 2019-08-10 ENCOUNTER — Telehealth: Payer: Self-pay

## 2019-08-10 DIAGNOSIS — N411 Chronic prostatitis: Secondary | ICD-10-CM

## 2019-08-10 NOTE — Telephone Encounter (Signed)
-----   Message from Birdie Sons, MD sent at 08/09/2019  6:58 PM EDT ----- Amylase is elevated indicating he may have mild case of pancreatitis.  Need to get abdominal ultrasound to evaluate.  If he likes we can refer to a different urologist for recurrent prostatitis.

## 2019-08-10 NOTE — Telephone Encounter (Signed)
Mrs Whidbee advised.  (Per DPR)  They agreed to the referral for the U/S and also a different Urologist.  Thanks,   Mickel Baas

## 2019-08-18 ENCOUNTER — Ambulatory Visit
Admission: RE | Admit: 2019-08-18 | Discharge: 2019-08-18 | Disposition: A | Discharge status: Home / Self Care | Payer: PPO | Source: Ambulatory Visit | Attending: Family Medicine | Admitting: Family Medicine

## 2019-08-18 ENCOUNTER — Other Ambulatory Visit: Payer: Self-pay

## 2019-08-18 DIAGNOSIS — R1010 Upper abdominal pain, unspecified: Secondary | ICD-10-CM | POA: Diagnosis not present

## 2019-08-18 DIAGNOSIS — R630 Anorexia: Secondary | ICD-10-CM | POA: Insufficient documentation

## 2019-08-19 ENCOUNTER — Telehealth: Payer: Self-pay

## 2019-08-19 DIAGNOSIS — R634 Abnormal weight loss: Secondary | ICD-10-CM

## 2019-08-19 DIAGNOSIS — R1010 Upper abdominal pain, unspecified: Secondary | ICD-10-CM

## 2019-08-19 DIAGNOSIS — R630 Anorexia: Secondary | ICD-10-CM

## 2019-08-19 DIAGNOSIS — K222 Esophageal obstruction: Secondary | ICD-10-CM

## 2019-08-19 NOTE — Telephone Encounter (Signed)
Pt advised.  He would like to proceed with the referral to Dr. Allen Norris.    Thanks,   -Mickel Baas

## 2019-08-19 NOTE — Telephone Encounter (Signed)
-----   Message from Birdie Sons, MD sent at 08/19/2019 12:58 PM EDT ----- Ultrasound is normal. No explanation for loss of appetite and weight loss. It may have been antibiotic that he was taking. If he is doing better than no additional testing is needed. If not then he will need to get back in to see Dr. Allen Norris.

## 2019-09-01 ENCOUNTER — Ambulatory Visit (INDEPENDENT_AMBULATORY_CARE_PROVIDER_SITE_OTHER): Payer: PPO | Admitting: Urology

## 2019-09-01 ENCOUNTER — Other Ambulatory Visit: Payer: Self-pay

## 2019-09-01 ENCOUNTER — Encounter: Payer: Self-pay | Admitting: Urology

## 2019-09-01 VITALS — BP 125/85 | HR 60 | Ht 72.0 in | Wt 205.0 lb

## 2019-09-01 DIAGNOSIS — N5082 Scrotal pain: Secondary | ICD-10-CM | POA: Diagnosis not present

## 2019-09-01 DIAGNOSIS — Z8546 Personal history of malignant neoplasm of prostate: Secondary | ICD-10-CM | POA: Diagnosis not present

## 2019-09-01 DIAGNOSIS — N411 Chronic prostatitis: Secondary | ICD-10-CM | POA: Diagnosis not present

## 2019-09-01 DIAGNOSIS — N5236 Erectile dysfunction following interstitial seed therapy: Secondary | ICD-10-CM

## 2019-09-01 MED ORDER — GABAPENTIN 100 MG PO CAPS: 100.0000 mg | ORAL_CAPSULE | Freq: Three times a day (TID) | ORAL | 3 refills | Status: DC

## 2019-09-01 NOTE — Progress Notes (Signed)
09/01/2019 9:43 AM   [name] 06-Jul-1946 [id number]  Referring provider: Birdie Sons, [place] [address] [place],  [place] [id number]  Chief Complaint  Patient presents with  . Prostatitis    Urologic history:  1.  Moderate risk prostate cancer  -Brachytherapy 2014 Dr. Yves Dill  2.  Solitary kidney  -Right open nephrectomy 2001 Dr. Yves Dill angiomyolipoma  3.  Erectile dysfunction  -Penile prosthesis 2013 Dr. Yves Dill  -Total explant 2014 for reservoir erosion to bladder  4.  Chronic scrotal content pain  -Onset after penile prosthesis explant   HPI: Luke [name] is a 73 y.o. male who was referred for chronic right scrotal pain however he was referred approximately 1 year ago for the same problem.  He had a scrotal ultrasound which did not reveal any abnormalities that would account for his pain.  He relates onset of his pain after removal of a penile implant by Dr. Yves Dill in 2014.  He has a history of prostate cancer status post brachytherapy.  Since I saw him last year he had an ED visit complaining of scrotal pain and recently saw Dr. Rosanna Randy and was treated with empiric antibiotics.  His urine culture was negative.  A PSA last month was 0.3   PMH: Past Medical History:  Diagnosis Date  . Acquired absence of kidney 12/29/1988  . Brachial neuritis 04/02/2009  . Claudication ([place]) 02/06/2016  . 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 Dysphagia 04/02/2009  . GERD (gastroesophageal reflux disease) 02/06/2016   Schatski ring seen on EGD 07-10-11   . History of MRSA infection 11/18/2007  . Hyperlipidemia, mixed 12/29/1998  . Hypertension 04/02/2009  . Hypogonadism male 02/06/2016  . Insomnia 02/06/2016  . LBP (low back pain) 07/13/2013  . Numbness of toes 02/06/2016  . Palpitations 02/06/2016  . Prediabetes 02/08/2016   A1c=6.2 02/08/16   . Prostate cancer ([place]) 2014   Seed Implants  . Single kidney   . Vitamin D deficiency 02/06/2016    Surgical History:  Past Surgical History:  Procedure Laterality Date  . ABI  07/09/2011   Normal; Left= 1.21, Right=1.00. Triphasic waver forms  . BALLOON DILATION N/A 06/13/2019   Procedure: BALLOON DILATION;  Surgeon: Lucilla Lame, MD;  Location: [facility];  Service: Endoscopy;  Laterality: N/A;  . Carotid Doppler Ultrasound  10/18/2012   Minimal soft plague both carotids. No hemodynamically significant stenosis  . CHOLECYSTECTOMY  2001  . COLONOSCOPY WITH PROPOFOL N/A 06/13/2019   Procedure: COLONOSCOPY WITH BIOPSIES;  Surgeon: Lucilla Lame, MD;  Location: [facility];  Service: Endoscopy;  Laterality: N/A;  . ESOPHAGOGASTRODUODENOSCOPY (EGD) WITH PROPOFOL N/A 06/13/2019   Procedure: ESOPHAGOGASTRODUODENOSCOPY (EGD) WITH BALLOON DILATION;  Surgeon: Lucilla Lame, MD;  Location: [facility];  Service: Endoscopy;  Laterality: N/A;  . Lumbar Spine X Ray  11/28/2011   Mild DDD L1- L2. Anterior heigt loss of T12-L1 vertebral bodies  . NEPHRECTOMY Right 2001   done by Dr. Eliberto Ivory for Angiomyolipoma  . PENILE PROSTHESIS  REMOVAL  11/28/2013   Dr. Eliberto Ivory  . PENILE PROSTHESIS IMPLANT  2008  . Photovaporization of prostate with green light laser  01/19/2012   Dr. Rogers Blocker  . POLYPECTOMY N/A 06/13/2019   Procedure: POLYPECTOMY;  Surgeon: Lucilla Lame, MD;  Location: [facility];  Service: Endoscopy;  Laterality: N/A;  . [place]  . UPPER GI ENDOSCOPY  07/10/2011   Done by Dr. Sharen Counter. Revealed Schatski rings    Home  Medications:  Allergies as of 09/01/2019   No Known Allergies     Medication List       Accurate as of September 01, 2019  9:43 AM. If you have any questions, ask your nurse or doctor.        hydrochlorothiazide 12.5 MG capsule Commonly known as: MICROZIDE Take 12.5 mg by mouth daily.   multivitamin tablet Take 1 tablet by mouth daily. occasionally   rivastigmine 6 MG capsule Commonly known as: EXELON TAKE 1 CAPSULE BY MOUTH TWICE A DAY FOR 14 DAYS  THEN INCREASE TO 2 CAPSULES BY MOUTH DAILY What changed: See the new instructions.   sertraline 100 MG tablet Commonly known as: ZOLOFT Take 1 tablet (100 mg total) by mouth as needed.   sildenafil 100 MG tablet Commonly known as: VIAGRA Take 100 mg by mouth as directed.   traMADol 50 MG tablet Commonly known as: ULTRAM Take 1 tablet (50 mg total) by mouth every 12 (twelve) hours as needed for moderate pain.   Vitamin D 50 MCG (2000 UT) Caps Take 1 capsule by mouth daily. occasionally       Allergies: No Known Allergies  Family History: Family History  Problem Relation Age of Onset  . Heart attack Mother   . Hypertension Father   . Prostate cancer Neg Hx   . Bladder Cancer Neg Hx   . Kidney cancer Neg Hx     Social History:  reports that he has never smoked. He has never used smokeless tobacco. He reports current alcohol use. He reports that he does not use drugs.  ROS: UROLOGY Frequent Urination?: No Hard to postpone urination?: No Burning/pain with urination?: No Get up at night to urinate?: No Leakage of urine?: No Urine stream starts and stops?: No Trouble starting stream?: No Do you have to strain to urinate?: No Blood in urine?: No Urinary tract infection?: No Sexually transmitted disease?: No Injury to kidneys or bladder?: No Painful intercourse?: No Erection problems?: Yes Penile pain?: No  Gastrointestinal Nausea?: No Vomiting?: No Indigestion/heartburn?: No Diarrhea?: No Constipation?: No  Constitutional Fever: No Night sweats?: No Weight loss?: Yes Fatigue?: Yes  Skin Skin rash/lesions?: No Itching?: No  Eyes Blurred vision?: Yes Double vision?: Yes  Ears/Nose/Throat Sore throat?: No Sinus problems?: No  Hematologic/Lymphatic Swollen glands?: No Easy bruising?: No  Cardiovascular Leg swelling?: No Chest pain?: No  Respiratory Cough?: No Shortness of breath?: No  Endocrine Excessive thirst?: No  Musculoskeletal  Back pain?: Yes Joint pain?: No  Neurological Headaches?: No Dizziness?: Yes  Psychologic Depression?: Yes Anxiety?: Yes  Physical Exam: BP 125/85   Pulse 60   Ht 6' (1.829 m)   Wt 205 lb (93 kg)   BMI 27.80 kg/m   Constitutional:  Alert and oriented, No acute distress. HEENT: [place] AT, moist mucus membranes.  Trachea midline, no masses. Cardiovascular: No clubbing, cyanosis, or edema. Respiratory: Normal respiratory effort, no increased work of breathing. GI: Abdomen is soft, nontender, nondistended, no abdominal masses GU: No CVA tenderness.  Phallus without lesions, testes descended bilaterally no masses or tenderness.  No significant tenderness on today's exam. Lymph: No cervical or inguinal lymphadenopathy. Skin: No rashes, bruises or suspicious lesions. Neurologic: Grossly intact, no focal deficits, moving all 4 extremities. Psychiatric: Normal mood and affect.   Assessment & Plan:    -Chronic scrotal content pain No significant abnormalities on scrotal sonogram performed last year.  We discussed in the majority of cases of scrotal pain and etiology is not  identified.  Will give a trial of gabapentin 300 mg twice daily.   Abbie Sons, [place] [address] [place], [place] [address] [phone]

## 2019-09-02 LAB — URINALYSIS, COMPLETE
Bilirubin, UA: NEGATIVE
Ketones, UA: NEGATIVE
Leukocytes,UA: NEGATIVE
Nitrite, UA: NEGATIVE
RBC, UA: NEGATIVE
Specific Gravity, UA: >1.03 — ABNORMAL HIGH (ref 1.005–1.030)
Urobilinogen, Ur: 1 mg/dL (ref 0.2–1.0)
pH, UA: 5 (ref 5.0–7.5)

## 2019-09-02 LAB — MICROSCOPIC EXAMINATION
Bacteria, UA: NONE SEEN
RBC, Urine: NONE SEEN /hpf (ref 0–2)

## 2019-09-19 ENCOUNTER — Ambulatory Visit: Payer: PPO | Admitting: Urology

## 2019-09-22 ENCOUNTER — Other Ambulatory Visit: Payer: Self-pay

## 2019-09-22 ENCOUNTER — Encounter: Payer: Self-pay | Admitting: Gastroenterology

## 2019-09-22 ENCOUNTER — Other Ambulatory Visit
Admission: RE | Admit: 2019-09-22 | Discharge: 2019-09-22 | Disposition: A | Discharge status: Home / Self Care | Payer: PPO | Attending: Gastroenterology | Admitting: Gastroenterology

## 2019-09-22 ENCOUNTER — Ambulatory Visit (INDEPENDENT_AMBULATORY_CARE_PROVIDER_SITE_OTHER): Payer: PPO | Admitting: Gastroenterology

## 2019-09-22 VITALS — BP 159/84 | HR 62 | Temp 98.0°F | Ht 72.0 in | Wt 210.4 lb

## 2019-09-22 DIAGNOSIS — Z01812 Encounter for preprocedural laboratory examination: Secondary | ICD-10-CM

## 2019-09-22 DIAGNOSIS — R634 Abnormal weight loss: Secondary | ICD-10-CM

## 2019-09-22 LAB — CREATININE, SERUM
Creatinine, Ser: 1.28 mg/dL — ABNORMAL HIGH (ref 0.61–1.24)
GFR calc Af Amer: >60 mL/min (ref >60)
GFR calc non Af Amer: 56 mL/min — ABNORMAL LOW (ref >60)

## 2019-09-22 NOTE — Progress Notes (Signed)
Primary Care Physician: Birdie Sons, MD  Primary Gastroenterologist:  Dr. Lucilla Lame  Chief Complaint  Patient presents with  . Dysphagia  . Weight Loss    HPI: Luke [name] is a 72 y.o. male here for follow-up.  The patient has seen me in the past for esophageal stricture with dilation of the esophageal stricture.  The patient presented to his primary care physician with loss of appetite abdominal pain and weight loss. The patient's wife states that he does not feel like he wants to eat.  He has no nausea vomiting fevers chills abdominal pain except for some left lower quadrant pain that his wife believes may be urological and she has taken him to a urologist.  The patient has dementia and is not able to give much history but the wife denies any black stools or bloody stools.  In fact there are no GI symptoms associated with his decreased p.o. intake.  She denies him having any dysphasia.  Current Outpatient Medications  Medication Sig Dispense Refill  . Cholecalciferol (VITAMIN D) 2000 units CAPS Take 1 capsule by mouth daily. occasionally    . gabapentin (NEURONTIN) 100 MG capsule Take 1 capsule (100 mg total) by mouth 3 (three) times daily. 90 capsule 3  . hydrochlorothiazide (MICROZIDE) 12.5 MG capsule Take 12.5 mg by mouth daily.    . Multiple Vitamin (MULTIVITAMIN) tablet Take 1 tablet by mouth daily. occasionally    . rivastigmine (EXELON) 6 MG capsule TAKE 1 CAPSULE BY MOUTH TWICE A DAY FOR 14 DAYS THEN INCREASE TO 2 CAPSULES BY MOUTH DAILY (Patient taking differently: Take 6 mg by mouth 2 (two) times daily. ) 180 capsule 4  . sertraline (ZOLOFT) 100 MG tablet Take 1 tablet (100 mg total) by mouth as needed. 90 tablet 3  . sildenafil (VIAGRA) 100 MG tablet Take 100 mg by mouth as directed.    . traMADol (ULTRAM) 50 MG tablet Take 1 tablet (50 mg total) by mouth every 12 (twelve) hours as needed for moderate pain. 10 tablet 0   No current facility-administered  medications for this visit.     Allergies as of 09/22/2019  . (No Known Allergies)    ROS:  General: Negative for anorexia, weight loss, fever, chills, fatigue, weakness. ENT: Negative for hoarseness, difficulty swallowing , nasal congestion. CV: Negative for chest pain, angina, palpitations, dyspnea on exertion, peripheral edema.  Respiratory: Negative for dyspnea at rest, dyspnea on exertion, cough, sputum, wheezing.  GI: See history of present illness. GU:  Negative for dysuria, hematuria, urinary incontinence, urinary frequency, nocturnal urination.  Endo: Negative for unusual weight change.    Physical Examination:   BP (!) 159/84   Pulse 62   Temp 98 F (36.7 C) (Oral)   Ht 6' (1.829 m)   Wt 210 lb 6.4 oz (95.4 kg)   BMI 28.54 kg/m   General: Well-nourished, well-developed in no acute distress.  Eyes: No icterus. Conjunctivae pink. Lungs: Clear to auscultation bilaterally. Non-labored. Heart: Regular rate and rhythm, no murmurs rubs or gallops.  Abdomen: Bowel sounds are normal, diffuse tenderness, nondistended, no hepatosplenomegaly or masses, no abdominal bruits or hernia , no rebound or guarding.   Extremities: No lower extremity edema. No clubbing or deformities. Neuro: Alert and oriented x 3.  Grossly intact. Skin: Warm and dry, no jaundice.   Psych: Alert and cooperative, normal mood and affect.  Labs:    Imaging Studies: No results found.  Assessment and Plan:  Luke [name] is a 73 y.o. y/o male who has dementia and is not able to answer many questions.  The patient's wife states that the patient has had a decreased appetite and typically does not finish his meals.  She also states that he is not having any GI symptoms except some twinges of abdominal pain in the left lower quadrant.  On my physical exam today it was hard to determine if he was having any abdominal pain although he jumped slightly whenever palpating anywhere over his abdomen.  The  patient will be set up for a CT scan of the abdomen and pelvis to look for any sign of pathology that may be causing his symptoms.  The patient's EGD and colonoscopy were negative for any possible cause of his abdominal pain.    Lucilla Lame, MD. [facility]   Note: This dictation was prepared with Dragon dictation along with smaller phrase technology. Any transcriptional errors that result from this process are unintentional.

## 2019-09-29 ENCOUNTER — Other Ambulatory Visit: Payer: Self-pay

## 2019-09-29 ENCOUNTER — Ambulatory Visit
Admission: RE | Admit: 2019-09-29 | Discharge: 2019-09-29 | Disposition: A | Discharge status: Home / Self Care | Payer: PPO | Source: Ambulatory Visit | Attending: Gastroenterology | Admitting: Gastroenterology

## 2019-09-29 DIAGNOSIS — K449 Diaphragmatic hernia without obstruction or gangrene: Secondary | ICD-10-CM | POA: Diagnosis not present

## 2019-09-29 DIAGNOSIS — R634 Abnormal weight loss: Secondary | ICD-10-CM

## 2019-09-29 MED ORDER — IOHEXOL 300 MG/ML  SOLN
100.0000 mL | Freq: Once | INTRAMUSCULAR | Status: AC | PRN
  Administered 2019-09-29: 100 mL via INTRAVENOUS

## 2019-10-03 ENCOUNTER — Telehealth: Payer: Self-pay

## 2019-10-03 ENCOUNTER — Encounter: Payer: Self-pay | Admitting: Family Medicine

## 2019-10-03 ENCOUNTER — Other Ambulatory Visit: Payer: Self-pay

## 2019-10-03 ENCOUNTER — Ambulatory Visit (INDEPENDENT_AMBULATORY_CARE_PROVIDER_SITE_OTHER): Payer: PPO | Admitting: Family Medicine

## 2019-10-03 VITALS — BP 144/80 | HR 60 | Temp 97.1°F | Resp 16 | Wt 208.0 lb

## 2019-10-03 DIAGNOSIS — F0390 Unspecified dementia without behavioral disturbance: Secondary | ICD-10-CM | POA: Diagnosis not present

## 2019-10-03 DIAGNOSIS — Z23 Encounter for immunization: Secondary | ICD-10-CM | POA: Diagnosis not present

## 2019-10-03 DIAGNOSIS — I10 Essential (primary) hypertension: Secondary | ICD-10-CM | POA: Diagnosis not present

## 2019-10-03 DIAGNOSIS — N50819 Testicular pain, unspecified: Secondary | ICD-10-CM

## 2019-10-03 MED ORDER — GABAPENTIN 300 MG PO CAPS: 300.0000 mg | ORAL_CAPSULE | Freq: Two times a day (BID) | ORAL | 1 refills | Status: DC

## 2019-10-03 NOTE — Telephone Encounter (Signed)
-----   Message from Lucilla Lame, MD sent at 10/01/2019  9:51 AM EDT ----- Let the patient's family know that the CT scan did not show any cause for his abnormal weight loss which I am happy to report.  His decreased p.o. intake and decreased desire to eat is likely the cause of his weight loss.

## 2019-10-03 NOTE — Progress Notes (Signed)
Patient: Luke [name] Male    DOB: 01-26-1946   73 y.o.   MRN: [id number] Visit Date: 10/03/2019  Today's Provider: Lelon Huh, MD   Chief Complaint  Patient presents with  . Hypertension  . Anxiety   Subjective:     Anxiety Presents for follow-up (patient last seen in office 03/16/19, we increased Zoloft to 100mg ) visit. Symptoms include depressed mood, excessive worry, insomnia and irritability. Patient reports no chest pain, compulsions, confusion, decreased concentration, dizziness, dry mouth, feeling of choking, hyperventilation, impotence, malaise, muscle tension, nausea, nervous/anxious behavior, obsessions, palpitations, panic, restlessness, shortness of breath or suicidal ideas. Symptoms occur most days. The quality of sleep is good.   Compliance with medications is 76-100%.    Hypertension, follow-up:  BP Readings from Last 3 Encounters:  10/03/19 (!) 161/96  09/22/19 (!) 159/84  09/01/19 125/85    He was last seen for hypertension 6 months ago.  BP at that visit was 142/80 . Management changes since that visit include increasing HCTZ to 25mg  qd. He reports excellent compliance with treatment. He is not having side effects.  He is not exercising. He is not adherent to low salt diet.   Outside blood pressures are systolic ranging from [id number] and diastolic in the [id number]. He is experiencing none.  Patient denies chest pain, chest pressure/discomfort, claudication, dyspnea, exertional chest pressure/discomfort, fatigue, irregular heart beat, lower extremity edema, near-syncope, orthopnea, palpitations, paroxysmal nocturnal dyspnea, syncope and tachypnea.   Cardiovascular risk factors include advanced age (older than 80 for men, 74 for women), diabetes mellitus, hypertension and male gender.  Use of agents associated with hypertension: none.     Weight trend: stable Wt Readings from Last 3 Encounters:  10/03/19 208 lb (94.3 kg)  09/22/19 210 lb 6.4 oz  (95.4 kg)  09/01/19 205 lb (93 kg)    Current diet: in general, a "healthy" diet    ------------------------------------------------------------------------  He is also frustrated by chronic groin pain, evaluated by Dr. Bernardo Heater last month. Has had extensive evaluation which has generally been normal. Was thought to possible due to neuropathy related to past penile implant extraction and was started on low dose gabapentin. He can't really tell if it has helped, but does still have the pain.   No Known Allergies   Current Outpatient Medications:  .  Cholecalciferol (VITAMIN D) 2000 units CAPS, Take 1 capsule by mouth daily. occasionally, Disp: , Rfl:  .  gabapentin (NEURONTIN) 100 MG capsule, Take 1 capsule (100 mg total) by mouth 3 (three) times daily., Disp: 90 capsule, Rfl: 3 .  hydrochlorothiazide (MICROZIDE) 12.5 MG capsule, Take 12.5 mg by mouth daily., Disp: , Rfl:  .  Multiple Vitamin (MULTIVITAMIN) tablet, Take 1 tablet by mouth daily. occasionally, Disp: , Rfl:  .  rivastigmine (EXELON) 6 MG capsule, TAKE 1 CAPSULE BY MOUTH TWICE A DAY FOR 14 DAYS THEN INCREASE TO 2 CAPSULES BY MOUTH DAILY (Patient taking differently: Take 6 mg by mouth 2 (two) times daily. ), Disp: 180 capsule, Rfl: 4 .  sertraline (ZOLOFT) 100 MG tablet, Take 1 tablet (100 mg total) by mouth as needed., Disp: 90 tablet, Rfl: 3 .  sildenafil (VIAGRA) 100 MG tablet, Take 100 mg by mouth as directed., Disp: , Rfl:  .  traMADol (ULTRAM) 50 MG tablet, Take 1 tablet (50 mg total) by mouth every 12 (twelve) hours as needed for moderate pain., Disp: 10 tablet, Rfl: 0  Review of Systems  Constitutional: Positive for irritability.  Respiratory: Negative for shortness of breath.   Cardiovascular: Negative for chest pain and palpitations.  Gastrointestinal: Negative for nausea.  Genitourinary: Negative for impotence.  Neurological: Negative for dizziness.  Psychiatric/Behavioral: Negative for confusion, decreased  concentration and suicidal ideas. The patient has insomnia. The patient is not nervous/anxious.     Social History   Tobacco Use  . Smoking status: Never Smoker  . Smokeless tobacco: Never Used  . Tobacco comment: smokes a Lexicographer with friends  Substance Use Topics  . Alcohol use: Yes    Alcohol/week: 0.0 standard drinks    Comment: occasional / monthly      Objective:    Vitals:   10/03/19 1422 10/03/19 1441  BP: (!) 161/96 (!) 144/80  Pulse: 60   Resp: 16   Temp: (!) 97.1 F (36.2 C)   TempSrc: Oral   Weight: 208 lb (94.3 kg)   Body mass index is 28.21 kg/m.   Physical Exam  General appearance: Overweight male, cooperative and in no acute distress Head: Normocephalic, without obvious abnormality, atraumatic Respiratory: Respirations even and unlabored, normal respiratory rate Extremities: All extremities are intact.  Skin: Skin color, texture, turgor normal. No rashes seen  Psych: Appropriate mood and affect. Neurologic: Mental status: Alert, oriented to person, and time, thought content appropriate.      Assessment & Plan    1. Dementia without behavioral disturbance, unspecified dementia type (HCC) Stable, Continue current medications.    2. Essential hypertension BP higher than usually today, continue monitor, discussed possible medication change if not improved at follow up.   3. . Need for influenza vaccination  - Flu Vaccine QUAD High Dose(Fluad)  5. Pain in testicle, unspecified laterality Chronic, possible neuritis, increase- gabapentin (NEURONTIN) 300 MG capsule; Take 1 capsule (300 mg total) by mouth 2 (two) times daily.  Dispense: 180 capsule; Refill: 1  The entirety of the information documented in the History of Present Illness, Review of Systems and Physical Exam were personally obtained by me. Portions of this information were initially documented by Minette Headland, CMA and reviewed by me for thoroughness and accuracy.       Lelon Huh, MD  [facility]

## 2019-10-03 NOTE — Patient Instructions (Addendum)
.   Please review the attached list of medications and notify my office if there are any errors.   . Please bring all of your medications to every appointment so we can make sure that our medication list is the same as yours.   

## 2019-10-03 NOTE — Telephone Encounter (Signed)
LVM for pt's wife to return my call. MyChart message was sent with results.

## 2019-10-05 ENCOUNTER — Ambulatory Visit: Payer: PPO | Admitting: Urology

## 2019-10-19 ENCOUNTER — Telehealth: Payer: Self-pay | Admitting: Family Medicine

## 2019-10-19 NOTE — Chronic Care Management (AMB) (Signed)
Chronic Care Management   Note  10/19/2019 Name: Luke [name] MRN: [id number] DOB: 1946/04/21  Luke [name] is a 73 y.o. year old male who is a primary care patient of Caryn Section, Kirstie Peri, MD. I reached out to [name] by phone today in response to a referral sent by Luke [name] health plan.     Luke [name] was given information about Chronic Care Management services today including:  1. CCM service includes personalized support from designated clinical staff supervised by his physician, including individualized plan of care and coordination with other care providers 2. 24/7 contact phone numbers for assistance for urgent and routine care needs. 3. Service will only be billed when office clinical staff spend 20 minutes or more in a month to coordinate care. 4. Only one practitioner may furnish and bill the service in a calendar month. 5. The patient may stop CCM services at any time (effective at the end of the month) by phone call to the office staff. 6. The patient will be responsible for cost sharing (co-pay) of up to 20% of the service fee (after annual deductible is met).  Patients wife Luke [name] agreed to services and verbal consent obtained.   Follow up plan: Telephone appointment with CCM team member scheduled for: 11/08/2019  [place]  ??[email or web address]   ??[phone]

## 2019-11-08 ENCOUNTER — Ambulatory Visit: Payer: Self-pay

## 2019-11-08 ENCOUNTER — Telehealth: Payer: PPO

## 2019-11-08 NOTE — Chronic Care Management (AMB) (Signed)
  Chronic Care Management   Outreach Note  11/08/2019 Name: Luke [name] MRN: [id number] DOB: Jan 13, 1946  Primary Care Provider: Birdie Sons, MD   An unsuccessful telephone outreach was attempted today. [name] was referred to the case management team for assistance with care management and care coordination. Left a HIPAA compliant voice message along with contact information requesting a return call.   Follow Up Plan:  -The care management team will reach out again within the next three weeks.    [facility] Latino [facility] Care Management [phone]

## 2019-11-29 ENCOUNTER — Telehealth: Payer: Self-pay

## 2019-12-06 ENCOUNTER — Telehealth: Payer: Self-pay

## 2019-12-06 ENCOUNTER — Ambulatory Visit: Payer: Self-pay

## 2019-12-06 NOTE — Chronic Care Management (AMB) (Signed)
  Chronic Care Management   Outreach Note  12/06/2019 Name: Luke [name] MRN: [id number] DOB: 07-22-1946  Primary  Care Provider: Birdie Sons, MD Reason for referral : Chronic Care Management   A second unsuccessful telephone outreach was attempted today. [name] was referred to the case management team for assistance with care management and care coordination.    Follow Up Plan: The care management team will reach out to [name] again within the next two to three weeks.   Horris Latino [facility] Care Management [phone]

## 2019-12-27 ENCOUNTER — Telehealth: Payer: Self-pay

## 2019-12-27 ENCOUNTER — Ambulatory Visit: Payer: Self-pay

## 2019-12-27 NOTE — Chronic Care Management (AMB) (Signed)
  Chronic Care Management   Outreach Note  12/27/2019 Name: Luke [name] MRN: [id number] DOB: June 30, 1946  Primary Care Provider: Birdie Sons, MD Reason for referral : Chronic Care Management    Third unsuccessful telephone outreach was attempted today. [name] was referred to the care management team for assistance with chronic care management and care coordination. His primary care provider will be notified of our unsuccessful attempts to maintain contact. The care management team will gladly outreach at any time in the future if he is interested in receiving assistance.   Follow Up Plan:  The care management team will gladly follow up with him after the primary care provider has a conversation with him regarding recommendation for care management engagement and subsequent re-referral for care management services.    Horris Latino [facility] Care Management [phone]

## 2019-12-29 ENCOUNTER — Other Ambulatory Visit: Payer: Self-pay | Admitting: Family Medicine

## 2019-12-31 NOTE — Telephone Encounter (Signed)
Refill request for tramadol 50 mg. LR 05/20/18, prescription expired on 11/16/18 Dr. Caryn Section

## 2020-02-03 ENCOUNTER — Other Ambulatory Visit: Payer: Self-pay

## 2020-02-03 ENCOUNTER — Encounter: Payer: Self-pay | Admitting: Family Medicine

## 2020-02-03 ENCOUNTER — Ambulatory Visit (INDEPENDENT_AMBULATORY_CARE_PROVIDER_SITE_OTHER): Payer: Medicare Other | Admitting: Family Medicine

## 2020-02-03 VITALS — BP 146/98 | HR 65 | Temp 96.8°F | Resp 18 | Wt 208.0 lb

## 2020-02-03 DIAGNOSIS — R7303 Prediabetes: Secondary | ICD-10-CM

## 2020-02-03 DIAGNOSIS — F0390 Unspecified dementia without behavioral disturbance: Secondary | ICD-10-CM

## 2020-02-03 DIAGNOSIS — I10 Essential (primary) hypertension: Secondary | ICD-10-CM

## 2020-02-03 DIAGNOSIS — N5082 Scrotal pain: Secondary | ICD-10-CM | POA: Diagnosis not present

## 2020-02-03 LAB — POCT GLYCOSYLATED HEMOGLOBIN (HGB A1C)
Est. average glucose Bld gHb Est-mCnc: 120
Hemoglobin A1C: 5.8 % — AB (ref 4.0–5.6)

## 2020-02-03 NOTE — Progress Notes (Signed)
Patient: Luke [name] Male    DOB: 1946-09-11   74 y.o.   MRN: [id number] Visit Date: 02/03/2020  Today's Provider: Lelon Huh, MD   Chief Complaint  Patient presents with  . Hypertension  . Hyperglycemia   Subjective:     HPI  Hypertension, follow-up:  BP Readings from Last 3 Encounters:  02/03/20 (!) 146/98  10/03/19 (!) 144/80  09/22/19 (!) 159/84    He was last seen for hypertension 3 months ago.  BP at that visit was 144/80. Management since that visit includes no changes. He reports good compliance with treatment. He is not having side effects.  He is exercising. He is adherent to low salt diet.   Outside blood pressures are not checked. He is experiencing none.  Patient denies chest pain, chest pressure/discomfort, claudication, dyspnea, exertional chest pressure/discomfort, fatigue, irregular heart beat, lower extremity edema, near-syncope, orthopnea, palpitations, paroxysmal nocturnal dyspnea, syncope and tachypnea.   Cardiovascular risk factors include advanced age (older than 6 for men, 56 for women), hypertension and male gender.  Use of agents associated with hypertension: none.     Weight trend: stable Wt Readings from Last 3 Encounters:  02/03/20 208 lb (94.3 kg)  10/03/19 208 lb (94.3 kg)  09/22/19 210 lb 6.4 oz (95.4 kg)    Current diet: well balanced  ------------------------------------------------------------------------  Prediabetes, Follow-up:   Lab Results  Component Value Date   HGBA1C 5.9 (H) 08/08/2019   HGBA1C 6.0 (A) 12/17/2018   HGBA1C 6.0 (H) 06/11/2018   GLUCOSE 90 08/08/2019   GLUCOSE 101 (H) 03/01/2019   GLUCOSE 115 (H) 06/26/2018    Last seen for for this 6 months ago.  Management since that visit includes no changes. Current symptoms include none and have been stable.  Weight trend: fluctuating a bit Prior visit with dietician: no Current diet: well balanced Current exercise: walking  Pertinent  Labs:    Component Value Date/Time   CHOL 170 08/08/2019 1151   TRIG 128 08/08/2019 1151   CHOLHDL 3.7 08/08/2019 1151   CREATININE 1.28 (H) 09/22/2019 1445   CREATININE 1.55 (H) 12/24/2013 2103    Wt Readings from Last 3 Encounters:  02/03/20 208 lb (94.3 kg)  10/03/19 208 lb (94.3 kg)  09/22/19 210 lb 6.4 oz (95.4 kg)    Follow up for Pain in testicle, unspecified laterality:  The patient was last seen for this 4 months ago. Changes made at last visit include increasing Gabapentin to 300mg  twice daily.  He reports fair compliance with treatment. Has only been taking one tablet daily of Gabapentin. He feels that condition is Unchanged. He is not having side effects.   ------------------------------------------------------------------------------------  Follow up for Dementia:  The patient was last seen for this 4 months ago. Changes made at last visit include none.  He reports good compliance with treatment. He feels that condition is Worse. Is still taking Exelon twice daily. His wife states they no longer allow him to drive.  She also states she had been only given him sertraline periodically, but is now given it too him everyday as it seems to help his mood stay more stable.  He is not having side effects.   ------------------------------------------------------------------------------------  No Known Allergies   Current Outpatient Medications:  .  Cholecalciferol (VITAMIN D) 2000 units CAPS, Take 1 capsule by mouth daily. occasionally, Disp: , Rfl:  .  gabapentin (NEURONTIN) 300 MG capsule, Take 1 capsule (300 mg total) by mouth 2 (two)  times daily., Disp: 180 capsule, Rfl: 1 .  hydrochlorothiazide (MICROZIDE) 12.5 MG capsule, Take 12.5 mg by mouth daily., Disp: , Rfl:  .  Multiple Vitamin (MULTIVITAMIN) tablet, Take 1 tablet by mouth daily. occasionally, Disp: , Rfl:  .  rivastigmine (EXELON) 6 MG capsule, TAKE 1 CAPSULE BY MOUTH TWICE A DAY FOR 14 DAYS THEN  INCREASE TO 2 CAPSULES BY MOUTH DAILY (Patient taking differently: Take 6 mg by mouth 2 (two) times daily. ), Disp: 180 capsule, Rfl: 4 .  sertraline (ZOLOFT) 100 MG tablet, Take 1 tablet (100 mg total) by mouth as needed., Disp: 90 tablet, Rfl: 3 .  sildenafil (VIAGRA) 100 MG tablet, Take 100 mg by mouth as directed., Disp: , Rfl:  .  traMADol (ULTRAM) 50 MG tablet, TAKE 1 TABLET BY MOUTH EVERY 8 (EIGHT) HOURS AS NEEDED FOR UP TO 5 DAYS. (7 DS PER INSURANCE), Disp: 21 tablet, Rfl: 5  Review of Systems  Constitutional: Negative for appetite change, chills and fever.  Respiratory: Negative for chest tightness, shortness of breath and wheezing.   Cardiovascular: Negative for chest pain and palpitations.  Gastrointestinal: Negative for abdominal pain, nausea and vomiting.    Social History   Tobacco Use  . Smoking status: Never Smoker  . Smokeless tobacco: Never Used  . Tobacco comment: smokes a Lexicographer with friends  Substance Use Topics  . Alcohol use: Yes    Alcohol/week: 0.0 standard drinks    Comment: occasional / monthly      Objective:   BP (!) 146/98 (BP Location: Right Arm, Cuff Size: Large)   Pulse 65   Temp (!) 96.8 F (36 C) (Temporal)   Resp 18   Wt 208 lb (94.3 kg)   SpO2 98% Comment: room air  BMI 28.21 kg/m  Vitals:   02/03/20 1354 02/03/20 1355  BP: 140/90 (!) 146/98  Pulse: 65   Resp: 18   Temp: (!) 96.8 F (36 C)   TempSrc: Temporal   SpO2: 98%   Weight: 208 lb (94.3 kg)   Body mass index is 28.21 kg/m.   Physical Exam   General: Appearance:     Mildly obese male in no acute distress  Eyes:    PERRL, conjunctiva/corneas clear, EOM's intact       Lungs:     Clear to auscultation bilaterally, respirations unlabored  Heart:    Normal heart rate. Normal rhythm. No murmurs, rubs, or gallops.   MS:   All extremities are intact.   Neurologic:   Awake, alert, oriented to person and place. No apparent focal neurological           defect.         Results for orders placed or performed in visit on 02/03/20  POCT HgB A1C  Result Value Ref Range   Hemoglobin A1C 5.8 (A) 4.0 - 5.6 %   Est. average glucose Bld gHb Est-mCnc 120        Assessment & Plan    1. Prediabetes Stable. Check a1c 1-2 x per year  2. Essential hypertension Relatively stable. Continue current medications.    3. Dementia without behavioral disturbance, unspecified dementia type ([place]) Slowly progressing. No longer driving. Tolerating Exelon.   4. Scrotal pain Negative GU workup. Suspected to be nerve pain possible from previous prostate surgery. He is only getting gabapentin once a day and advance he would likely get more relief if he could take twice a day consistently.   The entirety of the information  documented in the History of Present Illness, Review of Systems and Physical Exam were personally obtained by me. Portions of this information were initially documented by Meyer Cory, CMA and reviewed by me for thoroughness and accuracy.      Lelon Huh, MD  [facility]

## 2020-02-20 ENCOUNTER — Other Ambulatory Visit: Payer: Self-pay | Admitting: Family Medicine

## 2020-03-16 ENCOUNTER — Telehealth: Payer: Self-pay | Admitting: Urology

## 2020-03-16 NOTE — Telephone Encounter (Signed)
error 

## 2020-03-20 ENCOUNTER — Other Ambulatory Visit: Payer: Self-pay

## 2020-03-20 ENCOUNTER — Encounter: Payer: Self-pay | Admitting: Family Medicine

## 2020-03-20 ENCOUNTER — Ambulatory Visit (INDEPENDENT_AMBULATORY_CARE_PROVIDER_SITE_OTHER): Payer: Medicare Other | Admitting: Family Medicine

## 2020-03-20 VITALS — BP 156/73 | HR 60 | Temp 97.1°F | Wt 214.8 lb

## 2020-03-20 DIAGNOSIS — I10 Essential (primary) hypertension: Secondary | ICD-10-CM

## 2020-03-20 DIAGNOSIS — F0390 Unspecified dementia without behavioral disturbance: Secondary | ICD-10-CM

## 2020-03-20 DIAGNOSIS — D649 Anemia, unspecified: Secondary | ICD-10-CM

## 2020-03-20 NOTE — Progress Notes (Signed)
Patient: Luke [name]   DOB: 12/11/1946   74 y.o. Male  MRN: [id number] Visit Date: 03/20/2020  Today's Provider: Lelon Huh, MD  Subjective:    Chief Complaint  Patient presents with  . Dementia   HPI  Follow up for Dementia:  The patient was last seen for this 1 months ago. Changes made at last visit include none.  He reports good compliance with treatment. He wife feels that condition is Worse. He is not having side effects.   States about 1-2 weeks starting getting more confused and agitated. Poor appetite. Not taking medication not sleeping well. Getting up at night to go the bathroom a few times every night. Has been on rivastigmine for a few years but continues to worsen. Was referred to Dr. Manuella Ghazi a few years ago and family is interested in getting back in for follow up.  ------------------------------------------------------------------------------------   Patient Active Problem List   Diagnosis Date Noted  . Scrotal pain 09/01/2019  . History of prostate cancer 09/01/2019  . Personal history of colonic polyps   . Polyp of transverse colon   . Problems with swallowing and mastication   . Abnormal barium swallow   . Stricture and stenosis of esophagus   . Hydrocele in adult 04/19/2019  . Hearing difficulty 05/12/2017  . Dementia ([place]) 10/16/2016  . Inguinal pain 05/12/2016  . Erectile dysfunction after prostate brachytherapy 02/12/2016  . Solitary kidney 02/12/2016  . Prediabetes 02/08/2016  . GERD (gastroesophageal reflux disease) 02/06/2016  . Headache 02/06/2016  . Hypogonadism male 02/06/2016  . Insomnia 02/06/2016  . Numbness of toes 02/06/2016  . Palpitations 02/06/2016  . 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 Vitamin D deficiency 02/06/2016  . LBP (low back pain) 07/13/2013  . Dysphagia 04/02/2009  . Hypertension 04/02/2009  . Brachial neuritis 04/02/2009  . History of MRSA infection 11/18/2007  . Hyperlipidemia,  mixed 12/29/1998  . Acquired absence of kidney 12/29/1988   Social History   Tobacco Use  . Smoking status: Never Smoker  . Smokeless tobacco: Never Used  . Tobacco comment: smokes a Lexic[employer] with friends  Substance Use Topics  . Alcohol use: Yes    Alcohol/week: 0.0 standard drinks    Comment: occasional / monthly  . Drug use: No     Medications: Outpatient Medications Prior to Visit  Medication Sig Dispense Refill  . Cholecalciferol (VITAMIN D) 2000 units CAPS Take 1 capsule by mouth daily. occasionally    . gabapentin (NEURONTIN) 300 MG capsule Take 1 capsule (300 mg total) by mouth 2 (two) times daily. 180 capsule 1  . hydrochlorothiazide (HYDRODIURIL) 12.5 MG tablet TAKE 1 TABLET BY MOUTH EVERY DAY 90 tablet 3  . hydrochlorothiazide (MICROZIDE) 12.5 MG capsule Take 12.5 mg by mouth daily.    . Multiple Vitamin (MULTIVITAMIN) tablet Take 1 tablet by mouth daily. occasionally    . rivastigmine (EXELON) 6 MG capsule TAKE 1 CAPSULE BY MOUTH TWICE A DAY FOR 14 DAYS THEN INCREASE TO 2 CAPSULES BY MOUTH DAILY (Patient taking differently: Take 6 mg by mouth 2 (two) times daily. ) 180 capsule 4  . sertraline (ZOLOFT) 100 MG tablet Take 1 tablet (100 mg total) by mouth as needed. 90 tablet 3  . sildenafil (VIAGRA) 100 MG tablet Take 100 mg by mouth as directed.    . traMADol (ULTRAM) 50 MG tablet TAKE 1 TABLET BY MOUTH EVERY 8 (EIGHT) HOURS AS NEEDED FOR UP TO 5 DAYS. (  7 DS PER INSURANCE) 21 tablet 5   No facility-administered medications prior to visit.    Review of Systems  Constitutional: Negative.  Negative for appetite change, chills and fever.  Respiratory: Negative.  Negative for chest tightness, shortness of breath and wheezing.   Cardiovascular: Negative.  Negative for chest pain and palpitations.  Gastrointestinal: Negative for abdominal pain, nausea and vomiting.  Musculoskeletal: Negative.   Neurological:       Memory loss   Psychiatric/Behavioral:  Positive for agitation and confusion.        Objective:   BP (!) 156/73 (BP Location: Right Arm, Patient Position: Sitting, Cuff Size: Large)   Pulse 60   Temp (!) 97.1 F (36.2 C) (Temporal)   Wt 214 lb 12.8 oz (97.4 kg)   BMI 29.13 kg/m  Vitals:   03/20/20 1426  BP: (!) 156/73  Pulse: 60  Temp: (!) 97.1 F (36.2 C)  TempSrc: Temporal  Weight: 214 lb 12.8 oz (97.4 kg)     Physical Exam    General: Appearance:     Overweight male in no acute distress  Eyes:    PERRL, conjunctiva/corneas clear, EOM's intact       Lungs:     Clear to auscultation bilaterally, respirations unlabored  Heart:    Normal heart rate. Normal rhythm. No murmurs, rubs, or gallops.   MS:   All extremities are intact.   Neurologic:   Awake, alert, oriented x 3. No apparent focal neurological           defect.        No results found for any visits on 03/20/20.    Assessment & Plan:    1. Essential hypertension Uncontrolled, unremarkable EKG. Consider adding amlodipine to improve blood pressure.  - EKG 12-Lead  2. Dementia without behavioral disturbance, unspecified dementia type ([place]) Progressing on Exelon. Previously on combination namenda and Aricept, but Aricept was discontinue in 2019 due to having hallucinations on medication.   - EKG 12-Lead - Comprehensive metabolic panel - TSH - CBC - Vitamin B12 - Folate - MR Brain W Wo Contrast; Future - Ambulatory referral to Neurology  3. Anemia, unspecified type  - Iron and TIBC - Ferritin - Specimen status report     Lelon Huh, MD  [facility] [phone] (phone) [phone] (fax)  [place]

## 2020-03-21 LAB — COMPREHENSIVE METABOLIC PANEL
ALT: 7 IU/L (ref 0–44)
AST: 18 IU/L (ref 0–40)
Albumin/Globulin Ratio: 2 (ref 1.2–2.2)
Albumin: 4.3 g/dL (ref 3.7–4.7)
Alkaline Phosphatase: 82 IU/L (ref 39–117)
BUN/Creatinine Ratio: 11 (ref 10–24)
BUN: 14 mg/dL (ref 8–27)
Bilirubin Total: 0.6 mg/dL (ref 0.0–1.2)
CO2: 26 mmol/L (ref 20–29)
Calcium: 9.3 mg/dL (ref 8.6–10.2)
Chloride: 103 mmol/L (ref 96–106)
Creatinine, Ser: 1.27 mg/dL (ref 0.76–1.27)
GFR calc Af Amer: 65 mL/min/{1.73_m2} (ref >59)
GFR calc non Af Amer: 56 mL/min/{1.73_m2} — ABNORMAL LOW (ref >59)
Globulin, Total: 2.2 g/dL (ref 1.5–4.5)
Glucose: 83 mg/dL (ref 65–99)
Potassium: 3.9 mmol/L (ref 3.5–5.2)
Sodium: 141 mmol/L (ref 134–144)
Total Protein: 6.5 g/dL (ref 6.0–8.5)

## 2020-03-21 LAB — CBC
Hematocrit: 39.9 % (ref 37.5–51.0)
Hemoglobin: 12.5 g/dL — ABNORMAL LOW (ref 13.0–17.7)
MCH: 21.3 pg — ABNORMAL LOW (ref 26.6–33.0)
MCHC: 31.3 g/dL — ABNORMAL LOW (ref 31.5–35.7)
MCV: 68 fL — ABNORMAL LOW (ref 79–97)
Platelets: 272 10*3/uL (ref 150–450)
RBC: 5.87 x10E6/uL — ABNORMAL HIGH (ref 4.14–5.80)
RDW: 17.6 % — ABNORMAL HIGH (ref 11.6–15.4)
WBC: 5.3 10*3/uL (ref 3.4–10.8)

## 2020-03-21 LAB — FOLATE: Folate: 6.3 ng/mL (ref >3.0)

## 2020-03-21 LAB — VITAMIN B12: Vitamin B-12: 568 pg/mL (ref 232–1245)

## 2020-03-21 LAB — TSH: TSH: 0.746 u[IU]/mL (ref 0.450–4.500)

## 2020-03-22 LAB — IRON AND TIBC
Iron Saturation: 36 % (ref 15–55)
Iron: 90 ug/dL (ref 38–169)
Total Iron Binding Capacity: 253 ug/dL (ref 250–450)
UIBC: 163 ug/dL (ref 111–343)

## 2020-03-22 LAB — SPECIMEN STATUS REPORT

## 2020-03-22 LAB — FERRITIN: Ferritin: 398 ng/mL (ref 30–400)

## 2020-03-22 NOTE — Progress Notes (Deleted)
Established patient visit   {  This SmartText note template is in development as part of the [place].   This template contains optional SmartLists. If no selections are made from an optional SmartList, it will be automatically removed when the note is signed.  Left clicking SmartLists that are in blue, underlined text will show additional information regarding the patient's history unless you are already editing the note in a pop-up window.  This text will be automatically removed from this note when it is signed.:1}   Patient: Luke [name]   DOB: 09/20/1946   74 y.o. Male  MRN: [id number] Visit Date: 03/22/2020  Today's healthcare provider: Lelon Huh, MD  Subjective:    Chief Complaint  Patient presents with  . Dementia   HPI ***  {Show patient history (optional):23778::" "}   Medications: Outpatient Medications Prior to Visit  Medication Sig  . Vitamin D Take 1 capsule by mouth daily. occasionally  . gabapentin Take 1 capsule (300 mg total) by mouth 2 (two) times daily.  . hydrochlorothiazide TAKE 1 TABLET BY MOUTH EVERY DAY  . hydrochlorothiazide Take 12.5 mg by mouth daily.  . multivitamin Take 1 tablet by mouth daily. occasionally  . rivastigmine TAKE 1 CAPSULE BY MOUTH TWICE A DAY FOR 14 DAYS THEN INCREASE TO 2 CAPSULES BY MOUTH DAILY (Patient taking differently: Take 6 mg by mouth 2 (two) times daily. )  . sertraline Take 1 tablet (100 mg total) by mouth as needed.  . sildenafil Take 100 mg by mouth as directed.  . traMADol TAKE 1 TABLET BY MOUTH EVERY 8 (EIGHT) HOURS AS NEEDED FOR UP TO 5 DAYS. (7 DS PER INSURANCE)   No facility-administered medications prior to visit.    Review of Systems  Last CBC Lab Results  Component Value Date   WBC 5.3 03/20/2020   HGB 12.5 (L) 03/20/2020   HCT 39.9 03/20/2020   MCV 68 (L) 03/20/2020   MCH 21.3 (L) 03/20/2020   RDW 17.6 (H) 03/20/2020   PLT 272 [id number]   Last metabolic  panel Lab Results  Component Value Date   GLUCOSE 83 03/20/2020   NA 141 03/20/2020   K 3.9 03/20/2020   CL 103 03/20/2020   CO2 26 03/20/2020   BUN 14 03/20/2020   CREATININE 1.27 03/20/2020   GFRNONAA 56 (L) 03/20/2020   GFRAA 65 03/20/2020   CALCIUM 9.3 03/20/2020   PHOS 2.9 02/07/2016   PROT 6.5 03/20/2020   ALBUMIN 4.3 03/20/2020   LABGLOB 2.2 03/20/2020   AGRATIO 2.0 03/20/2020   BILITOT 0.6 03/20/2020   ALKPHOS 82 03/20/2020   AST 18 03/20/2020   ALT 7 03/20/2020   ANIONGAP 8 03/01/2019   Last lipids Lab Results  Component Value Date   CHOL 170 08/08/2019   HDL 46 08/08/2019   LDLCALC 98 08/08/2019   TRIG 128 08/08/2019   CHOLHDL 3.7 08/08/2019    Last thyroid functions Lab Results  Component Value Date   TSH 0.746 03/20/2020   T4TOTAL 6.2 09/09/2016   Last vitamin D Lab Results  Component Value Date   VD25OH 29.6 (L) 08/08/2019   Last vitamin B12 and Folate Lab Results  Component Value Date   VITAMINB12 568 03/20/2020   FOLATE 6.3 03/20/2020      Objective:    BP (!) 156/73 (BP Location: Right Arm, Patient Position: Sitting, Cuff Size: Large)   Pulse 60   Temp (!) 97.1 F (36.2 C) (Temporal)  Wt 214 lb 12.8 oz (97.4 kg)   BMI 29.13 kg/m  {Show previous vitals signs (optional):23777::" "}  Physical Exam  ***     Assessment & Plan:    ***     Lelon Huh, MD  [facility] [phone] (phone) [phone] (fax)  [place]

## 2020-03-22 NOTE — Progress Notes (Deleted)
Established patient visit   {  This SmartText note template is in development as part of the [place].   This template contains optional SmartLists. If no selections are made from an optional SmartList, it will be automatically removed when the note is signed.  Left clicking SmartLists that are in blue, underlined text will show additional information regarding the patient's history unless you are already editing the note in a pop-up window.  This text will be automatically removed from this note when it is signed.:1}   Patient: Luke [name]   DOB: [birth year]   74 y.o. Male  MRN: [id number] Visit Date: 03/22/2020  Today's healthcare provider: Lelon Huh, MD  Subjective:    Chief Complaint  Patient presents with  . Dementia   HPI ***  {Show patient history (optional):23778::" "}   Medications: Outpatient Medications Prior to Visit  Medication Sig  . Vitamin D Take 1 capsule by mouth daily. occasionally  . gabapentin Take 1 capsule (300 mg total) by mouth 2 (two) times daily.  . hydrochlorothiazide TAKE 1 TABLET BY MOUTH EVERY DAY  . hydrochlorothiazide Take 12.5 mg by mouth daily.  . multivitamin Take 1 tablet by mouth daily. occasionally  . rivastigmine TAKE 1 CAPSULE BY MOUTH TWICE A DAY FOR 14 DAYS THEN INCREASE TO 2 CAPSULES BY MOUTH DAILY (Patient taking differently: Take 6 mg by mouth 2 (two) times daily. )  . sertraline Take 1 tablet (100 mg total) by mouth as needed.  . sildenafil Take 100 mg by mouth as directed.  . traMADol TAKE 1 TABLET BY MOUTH EVERY 8 (EIGHT) HOURS AS NEEDED FOR UP TO 5 DAYS. (7 DS PER INSURANCE)   No facility-administered medications prior to visit.    Review of Systems  {Show previous labs (optional):23779::" "}    Objective:    BP (!) 156/73 (BP Location: Right Arm, Patient Position: Sitting, Cuff Size: Large)   Pulse 60   Temp (!) 97.1 F (36.2 C) (Temporal)   Wt 214 lb 12.8 oz (97.4 kg)   BMI  29.13 kg/m  {Show previous vitals signs (optional):23777::" "}  Physical Exam  *** Results for orders placed or performed in visit on 03/20/20  Comprehensive metabolic panel  Result Value Ref Range   Glucose 83 65 - 99 mg/dL   BUN 14 8 - 27 mg/dL   Creatinine, Ser 1.27 0.76 - 1.27 mg/dL   GFR calc non Af Amer 56 (L) >59 mL/min/1.73   GFR calc Af Amer 65 >59 mL/min/1.73   BUN/Creatinine Ratio 11 10 - 24   Sodium 141 134 - 144 mmol/L   Potassium 3.9 3.5 - 5.2 mmol/L   Chloride 103 96 - 106 mmol/L   CO2 26 20 - 29 mmol/L   Calcium 9.3 8.6 - 10.2 mg/dL   Total Protein 6.5 6.0 - 8.5 g/dL   Albumin 4.3 3.7 - 4.7 g/dL   Globulin, Total 2.2 1.5 - 4.5 g/dL   Albumin/Globulin Ratio 2.0 1.2 - 2.2   Bilirubin Total 0.6 0.0 - 1.2 mg/dL   Alkaline Phosphatase 82 39 - 117 IU/L   AST 18 0 - 40 IU/L   ALT 7 0 - 44 IU/L  TSH  Result Value Ref Range   TSH 0.746 0.450 - 4.500 uIU/mL  CBC  Result Value Ref Range   WBC 5.3 3.4 - 10.8 x10E3/uL   RBC 5.87 (H) 4.14 - 5.80 x10E6/uL   Hemoglobin 12.5 (L) 13.0 - 17.7  g/dL   Hematocrit 39.9 37.5 - 51.0 %   MCV 68 (L) 79 - 97 fL   MCH 21.3 (L) 26.6 - 33.0 pg   MCHC 31.3 (L) 31.5 - 35.7 g/dL   RDW 17.6 (H) 11.6 - 15.4 %   Platelets 272 150 - 450 x10E3/uL  Vitamin B12  Result Value Ref Range   Vitamin B-12 568 232 - 1,245 pg/mL  Folate  Result Value Ref Range   Folate 6.3 >3.0 ng/mL  Iron and TIBC  Result Value Ref Range   Total Iron Binding Capacity 253 250 - 450 ug/dL   UIBC 163 111 - 343 ug/dL   Iron 90 38 - 169 ug/dL   Iron Saturation 36 15 - 55 %  Ferritin  Result Value Ref Range   Ferritin 398 30 - 400 ng/mL  Specimen status report  Result Value Ref Range   specimen status report Comment       Assessment & Plan:    ***     Lelon Huh, MD  [facility] [phone] (phone) [phone] (fax)  [place]

## 2020-03-31 ENCOUNTER — Other Ambulatory Visit: Payer: Self-pay | Admitting: Family Medicine

## 2020-03-31 DIAGNOSIS — G4700 Insomnia, unspecified: Secondary | ICD-10-CM

## 2020-04-02 ENCOUNTER — Other Ambulatory Visit: Payer: Self-pay

## 2020-04-02 DIAGNOSIS — R197 Diarrhea, unspecified: Secondary | ICD-10-CM | POA: Insufficient documentation

## 2020-04-02 DIAGNOSIS — F0390 Unspecified dementia without behavioral disturbance: Secondary | ICD-10-CM | POA: Insufficient documentation

## 2020-04-02 DIAGNOSIS — Z8546 Personal history of malignant neoplasm of prostate: Secondary | ICD-10-CM | POA: Diagnosis not present

## 2020-04-02 DIAGNOSIS — I10 Essential (primary) hypertension: Secondary | ICD-10-CM | POA: Diagnosis not present

## 2020-04-02 DIAGNOSIS — R109 Unspecified abdominal pain: Secondary | ICD-10-CM | POA: Diagnosis present

## 2020-04-02 DIAGNOSIS — Z79899 Other long term (current) drug therapy: Secondary | ICD-10-CM | POA: Insufficient documentation

## 2020-04-02 DIAGNOSIS — Z9049 Acquired absence of other specified parts of digestive tract: Secondary | ICD-10-CM | POA: Insufficient documentation

## 2020-04-02 LAB — COMPREHENSIVE METABOLIC PANEL
ALT: 9 U/L (ref 0–44)
AST: 16 U/L (ref 15–41)
Albumin: 4.3 g/dL (ref 3.5–5.0)
Alkaline Phosphatase: 75 U/L (ref 38–126)
Anion gap: 9 (ref 5–15)
BUN: 16 mg/dL (ref 8–23)
CO2: 26 mmol/L (ref 22–32)
Calcium: 9.5 mg/dL (ref 8.9–10.3)
Chloride: 102 mmol/L (ref 98–111)
Creatinine, Ser: 1.19 mg/dL (ref 0.61–1.24)
GFR calc Af Amer: >60 mL/min (ref >60)
GFR calc non Af Amer: >60 mL/min (ref >60)
Glucose, Bld: 87 mg/dL (ref 70–99)
Potassium: 3.4 mmol/L — ABNORMAL LOW (ref 3.5–5.1)
Sodium: 137 mmol/L (ref 135–145)
Total Bilirubin: 1.1 mg/dL (ref 0.3–1.2)
Total Protein: 7.3 g/dL (ref 6.5–8.1)

## 2020-04-02 LAB — CBC
HCT: 41.3 % (ref 39.0–52.0)
Hemoglobin: 13.5 g/dL (ref 13.0–17.0)
MCH: 21.8 pg — ABNORMAL LOW (ref 26.0–34.0)
MCHC: 32.7 g/dL (ref 30.0–36.0)
MCV: 66.7 fL — ABNORMAL LOW (ref 80.0–100.0)
Platelets: 285 10*3/uL (ref 150–400)
RBC: 6.19 MIL/uL — ABNORMAL HIGH (ref 4.22–5.81)
RDW: 17.3 % — ABNORMAL HIGH (ref 11.5–15.5)
WBC: 6.6 10*3/uL (ref 4.0–10.5)
nRBC: 0 % (ref 0.0–0.2)

## 2020-04-02 LAB — URINALYSIS, COMPLETE (UACMP) WITH MICROSCOPIC
Bacteria, UA: NONE SEEN
Bilirubin Urine: NEGATIVE
Glucose, UA: NEGATIVE mg/dL
Hgb urine dipstick: NEGATIVE
Ketones, ur: NEGATIVE mg/dL
Leukocytes,Ua: NEGATIVE
Nitrite: NEGATIVE
Protein, ur: 100 mg/dL — AB
Specific Gravity, Urine: 1.02 (ref 1.005–1.030)
Squamous Epithelial / HPF: NONE SEEN (ref 0–5)
pH: 6 (ref 5.0–8.0)

## 2020-04-02 LAB — LIPASE, BLOOD: Lipase: 31 U/L (ref 11–51)

## 2020-04-02 MED ORDER — SODIUM CHLORIDE 0.9% FLUSH: 3.0000 mL | Freq: Once | INTRAVENOUS | Status: DC

## 2020-04-02 NOTE — ED Triage Notes (Signed)
Per pt wife, pt has had mid abd pain with N/V/D that started late last night.

## 2020-04-03 ENCOUNTER — Encounter: Payer: Self-pay | Admitting: Radiology

## 2020-04-03 ENCOUNTER — Emergency Department: Payer: Medicare Other

## 2020-04-03 ENCOUNTER — Emergency Department
Admission: EM | Admit: 2020-04-03 | Discharge: 2020-04-03 | Disposition: A | Discharge status: Home / Self Care | Payer: Medicare Other | Attending: Emergency Medicine | Admitting: Emergency Medicine

## 2020-04-03 ENCOUNTER — Ambulatory Visit
Admission: RE | Admit: 2020-04-03 | Discharge: 2020-04-03 | Disposition: A | Discharge status: Home / Self Care | Payer: Medicare Other | Source: Ambulatory Visit | Attending: Family Medicine | Admitting: Family Medicine

## 2020-04-03 DIAGNOSIS — F0390 Unspecified dementia without behavioral disturbance: Secondary | ICD-10-CM

## 2020-04-03 DIAGNOSIS — R197 Diarrhea, unspecified: Secondary | ICD-10-CM

## 2020-04-03 MED ORDER — IOHEXOL 300 MG/ML  SOLN
125.0000 mL | Freq: Once | INTRAMUSCULAR | Status: AC | PRN
  Administered 2020-04-03: 125 mL via INTRAVENOUS

## 2020-04-03 MED ORDER — SODIUM CHLORIDE 0.9 % IV BOLUS
500.0000 mL | Freq: Once | INTRAVENOUS | Status: AC
  Administered 2020-04-03: 500 mL via INTRAVENOUS

## 2020-04-03 MED ORDER — GADOBUTROL 1 MMOL/ML IV SOLN
10.0000 mL | Freq: Once | INTRAVENOUS | Status: AC | PRN
  Administered 2020-04-03: 12:00:00 10 mL via INTRAVENOUS

## 2020-04-03 NOTE — ED Notes (Signed)
Pt transported to CT ?

## 2020-04-03 NOTE — ED Notes (Signed)
Pt was unable to provide stool sample; collection cups provided with instructions to bring sample to PCP

## 2020-04-03 NOTE — ED Notes (Signed)
Pt instructed to inform this RN if he is able to have a bowel movement; stool sample is needed

## 2020-04-03 NOTE — ED Notes (Signed)
BP 188/93, Dr. Owens Shark aware.  Wife states pt will take next dose of HTN med in the morning.

## 2020-04-03 NOTE — ED Provider Notes (Signed)
[facility] Emergency Department Provider Note  ____________________________________________   First MD Initiated Contact with Patient 04/03/20 0030     (approximate)  I have reviewed the triage vital signs and the nursing notes.   HISTORY  Chief Complaint Abdominal Pain    HPI Luke [name] is a 74 y.o. male with below list of previous medical conditions presents to the emergency department secondary to 2-day history of abdominal pain nausea, nonbloody vomiting and nonbloody diarrhea.  Patient states that he has had 2 additional diarrheal episodes while waiting in the emergency department.  Patient denies any urinary symptoms.  Patient does admit to current pain score of 6 out of 10.  Patient denies any dizziness.          Past Medical History:  Diagnosis Date  . Acquired absence of kidney 12/29/1988  . Brachial neuritis 04/02/2009  . Claudication ([place]) 02/06/2016  . 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 Dysphagia 04/02/2009  . GERD (gastroesophageal reflux disease) 02/06/2016   Schatski ring seen on EGD 07-10-11   . History of MRSA infection 11/18/2007  . Hyperlipidemia, mixed 12/29/1998  . Hypertension 04/02/2009  . Hypogonadism male 02/06/2016  . Insomnia 02/06/2016  . LBP (low back pain) 07/13/2013  . Numbness of toes 02/06/2016  . Palpitations 02/06/2016  . Prediabetes 02/08/2016   A1c=6.2 02/08/16   . Prostate cancer ([place]) 2014   Seed Implants  . Single kidney   . Vitamin D deficiency 02/06/2016    Patient Active Problem List   Diagnosis Date Noted  . Scrotal pain 09/01/2019  . History of prostate cancer 09/01/2019  . Personal history of colonic polyps   . Polyp of transverse colon   . Problems with swallowing and mastication   . Abnormal barium swallow   . Stricture and stenosis of esophagus   . Hydrocele in adult 04/19/2019  . Hearing difficulty 05/12/2017  . Dementia ([place]) 10/16/2016  . Inguinal pain 05/12/2016  . Erectile  dysfunction after prostate brachytherapy 02/12/2016  . Solitary kidney 02/12/2016  . Prediabetes 02/08/2016  . GERD (gastroesophageal reflux disease) 02/06/2016  . Headache 02/06/2016  . Hypogonadism male 02/06/2016  . Insomnia 02/06/2016  . Numbness of toes 02/06/2016  . Palpitations 02/06/2016  . 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 Vitamin D deficiency 02/06/2016  . LBP (low back pain) 07/13/2013  . Dysphagia 04/02/2009  . Hypertension 04/02/2009  . Brachial neuritis 04/02/2009  . History of MRSA infection 11/18/2007  . Hyperlipidemia, mixed 12/29/1998  . Acquired absence of kidney 12/29/1988    Past Surgical History:  Procedure Laterality Date  . ABI  07/09/2011   Normal; Left= 1.21, Right=1.00. Triphasic waver forms  . BALLOON DILATION N/A 06/13/2019   Procedure: BALLOON DILATION;  Surgeon: Lucilla Lame, MD;  Location: [place];  Service: Endoscopy;  Laterality: N/A;  . Carotid Doppler Ultrasound  10/18/2012   Minimal soft plague both carotids. No hemodynamically significant stenosis  . CHOLECYSTECTOMY  2001  . COLONOSCOPY WITH PROPOFOL N/A 06/13/2019   Procedure: COLONOSCOPY WITH BIOPSIES;  Surgeon: Lucilla Lame, MD;  Location: [place];  Service: Endoscopy;  Laterality: N/A;  . ESOPHAGOGASTRODUODENOSCOPY (EGD) WITH PROPOFOL N/A 06/13/2019   Procedure: ESOPHAGOGASTRODUODENOSCOPY (EGD) WITH BALLOON DILATION;  Surgeon: Lucilla Lame, MD;  Location: [place];  Service: Endoscopy;  Laterality: N/A;  . Lumbar Spine X Ray  11/28/2011   Mild DDD L1- L2. Anterior heigt loss of T12-L1 vertebral bodies  . NEPHRECTOMY Right 2001  done by Dr. Eliberto Ivory for Angiomyolipoma  . PENILE PROSTHESIS  REMOVAL  11/28/2013   Dr. Eliberto Ivory  . PENILE PROSTHESIS IMPLANT  2008  . Photovaporization of prostate with green light laser  01/19/2012   Dr. Rogers Blocker  . POLYPECTOMY N/A 06/13/2019   Procedure: POLYPECTOMY;  Surgeon: Lucilla Lame, MD;  Location: [place];  Service: Endoscopy;  Laterality: N/A;  . [place]  . UPPER GI ENDOSCOPY  07/10/2011   Done by Dr. Sharen Counter. Revealed Schatski rings    Prior to Admission medications   Medication Sig Start Date End Date Taking? Authorizing Provider  Cholecalciferol (VITAMIN D) 2000 units CAPS Take 1 capsule by mouth daily. occasionally    [provider]  gabapentin (NEURONTIN) 300 MG capsule Take 1 capsule (300 mg total) by mouth 2 (two) times daily. 10/03/19   Birdie Sons, MD  hydrochlorothiazide (HYDRODIURIL) 12.5 MG tablet TAKE 1 TABLET BY MOUTH EVERY DAY 02/20/20   Birdie Sons, MD  hydrochlorothiazide (MICROZIDE) 12.5 MG capsule Take 12.5 mg by mouth daily.    [provider]  Multiple Vitamin (MULTIVITAMIN) tablet Take 1 tablet by mouth daily. occasionally    [provider]  rivastigmine (EXELON) 6 MG capsule TAKE 1 CAPSULE BY MOUTH TWICE A DAY FOR 14 DAYS THEN INCREASE TO 2 CAPSULES BY MOUTH DAILY Patient taking differently: Take 6 mg by mouth 2 (two) times daily.  07/13/19   Birdie Sons, MD  sertraline (ZOLOFT) 100 MG tablet Take 1 tablet (100 mg total) by mouth as needed. 03/16/19   Birdie Sons, MD  sildenafil (VIAGRA) 100 MG tablet Take 100 mg by mouth as directed. 02/15/19   [provider]  traMADol (ULTRAM) 50 MG tablet TAKE 1 TABLET BY MOUTH EVERY 8 (EIGHT) HOURS AS NEEDED FOR UP TO 5 DAYS. (7 DS PER INSURANCE) 01/02/20   Birdie Sons, MD    Allergies Donepezil  Family History  Problem Relation Age of Onset  . Heart attack Mother   . Hypertension Father   . Prostate cancer Neg Hx   . Bladder Cancer Neg Hx   . Kidney cancer Neg Hx     Social History Social History   Tobacco Use  . Smoking status: Never Smoker  . Smokeless tobacco: Never Used  . Tobacco comment: smokes a Lexicographer with friends  Substance Use Topics  . Alcohol use: Yes    Alcohol/week: 0.0 standard drinks    Comment:  occasional / monthly  . Drug use: No    Review of Systems Constitutional: No fever/chills Eyes: No visual changes. ENT: No sore throat. Cardiovascular: Denies chest pain. Respiratory: Denies shortness of breath. Gastrointestinal: Positive for abdominal pain nausea vomiting diarrhea Genitourinary: Negative for dysuria. Musculoskeletal: Negative for neck pain.  Negative for back pain. Integumentary: Negative for rash. Neurological: Negative for headaches, focal weakness or numbness.   ____________________________________________   PHYSICAL EXAM:  VITAL SIGNS: ED Triage Vitals  Enc Vitals Group     BP 04/02/20 1632 116/79     Pulse Rate 04/02/20 1632 61     Resp 04/02/20 1632 18     Temp 04/02/20 1632 98.3 F (36.8 C)     Temp Source 04/02/20 1632 Oral     SpO2 04/02/20 1632 100 %     Weight 04/02/20 1631 99.8 kg (220 lb)     Height 04/02/20 1631 1.727 m (5\' 8" )     Head Circumference --  Peak Flow --      Pain Score --      Pain Loc --      Pain Edu? --      Excl. in [place]? --     Constitutional: Alert and oriented.  Eyes: Conjunctivae are normal.  Mouth/Throat: Patient is wearing a mask. Neck: No stridor.  No meningeal signs.   Cardiovascular: Normal rate, regular rhythm. Good peripheral circulation. Grossly normal heart sounds. Respiratory: Normal respiratory effort.  No retractions. Gastrointestinal: Left upper quadrant and left lower quadrant tenderness to palpation.  No guarding, no distention no rebound Musculoskeletal: No lower extremity tenderness nor edema. No gross deformities of extremities. Neurologic:  Normal speech and language. No gross focal neurologic deficits are appreciated.  Skin:  Skin is warm, dry and intact. Psychiatric: Mood and affect are normal. Speech and behavior are normal.  ____________________________________________   LABS (all labs ordered are listed, but only abnormal results are displayed)  Labs Reviewed  COMPREHENSIVE  METABOLIC PANEL - Abnormal; Notable for the following components:      Result Value   Potassium 3.4 (*)    All other components within normal limits  CBC - Abnormal; Notable for the following components:   RBC 6.19 (*)    MCV 66.7 (*)    MCH 21.8 (*)    RDW 17.3 (*)    All other components within normal limits  URINALYSIS, COMPLETE (UACMP) WITH MICROSCOPIC - Abnormal; Notable for the following components:   Color, Urine YELLOW (*)    APPearance CLOUDY (*)    Protein, ur 100 (*)    All other components within normal limits  GI PATHOGEN PANEL BY PCR, STOOL  C DIFFICILE QUICK SCREEN W PCR REFLEX  LIPASE, BLOOD   ____________________________________________  EKG  ED ECG REPORT I, [place] Kaniel Kiang, the attending physician, personally viewed and interpreted this ECG.   Date: 04/02/2020  EKG Time: 4:35 PM  Rate: 61  Rhythm: Normal sinus rhythm  Axis: Normal  Intervals: Normal  ST&T Change: None  ____________________________________________  RADIOLOGY I, [place] Hosteen Kienast, personally viewed and evaluated these images (plain radiographs) as part of my medical decision making, as well as reviewing the written report by the radiologist.  ED MD interpretation: CT scan revealed no acute findings in the abdomen or pelvis  Official radiology report(s): CT ABDOMEN PELVIS W CONTRAST  Result Date: 04/03/2020 CLINICAL DATA:  Abdominal pain. EXAM: CT ABDOMEN AND PELVIS WITH CONTRAST TECHNIQUE: Multidetector CT imaging of the abdomen and pelvis was performed using the standard protocol following bolus administration of intravenous contrast. CONTRAST:  159mL OMNIPAQUE IOHEXOL 300 MG/ML  SOLN COMPARISON:  September 29, 2019 FINDINGS: Lower chest: No acute abnormality. Hepatobiliary: No focal liver abnormality is seen. Status post cholecystectomy. No biliary dilatation. Pancreas: Unremarkable. No pancreatic ductal dilatation or surrounding inflammatory changes. Spleen: Normal in size without focal  abnormality. Adrenals/Urinary Tract: A stable 1.2 cm x 1.5 cm isodense right adrenal mass is seen. A stable 1.4 cm x 1.2 cm low-attenuation left adrenal mass is also noted. The right kidney is surgically absent. The left kidney is normal in size, without renal calculi, focal lesion, or hydronephrosis. Bladder is unremarkable. Stomach/Bowel: A very small hiatal hernia is seen which is decreased in size when compared to the prior exam. Appendix appears normal. No evidence of bowel wall thickening, distention, or inflammatory changes. Vascular/Lymphatic: Moderate to marked severity aortic atherosclerosis. No enlarged abdominal or pelvic lymph nodes. Reproductive: Numerous prostate radiation implantation seeds are seen. Other:  A stable 6.9 cm x 3.3 cm lipoma is seen along the anterolateral aspect of the mid left chest wall. A stable 2.5 cm x 1.3 cm intramuscular lipoma is seen along the posterolateral aspect of the lower right chest wall. There is a stable 2.9 cm x 1.7 cm fat containing right inguinal hernia. A stable trace amount of posterior pelvic free fluid is seen. Musculoskeletal: Multilevel degenerative changes seen throughout the lumbar spine. IMPRESSION: 1. Evidence of prior right nephrectomy. 2. Stable bilateral adrenal nodules, likely representing benign adenomas. 3. Stable fat containing right inguinal hernia. 4. Prostate radiation implantation seeds. Aortic Atherosclerosis (ICD10-I70.0). Electronically Signed   By: Virgina Norfolk M.D.   On: 04/03/2020 01:53    Procedures   ____________________________________________   INITIAL IMPRESSION / MDM / ASSESSMENT AND PLAN / ED COURSE  As part of my medical decision making, I reviewed the following data within the [place] NUMBER   74 year old male presented with above-stated history and physical exam a differential diagnosis including but not limited to diverticulitis colitis gastroenteritis as such CT scan of the abdomen pelvis  performed which revealed no acute intra-abdominal findings.  Patient's laboratory data also reassuring.  Patient received 500 mL of normal saline IV      ____________________________________________  FINAL CLINICAL IMPRESSION(S) / ED DIAGNOSES  Final diagnoses:  Diarrhea, unspecified type     MEDICATIONS GIVEN DURING THIS VISIT:  Medications  sodium chloride flush (NS) 0.9 % injection 3 mL (has no administration in time range)  sodium chloride 0.9 % bolus 500 mL (0 mLs Intravenous Stopped 04/03/20 0153)  iohexol (OMNIPAQUE) 300 MG/ML solution 125 mL (125 mLs Intravenous Contrast Given 04/03/20 0102)     ED Discharge Orders    None      *Please note:  EROS GREENFIELD was evaluated in Emergency Department on 04/03/2020 for the symptoms described in the history of present illness. He was evaluated in the context of the global COVID-19 pandemic, which necessitated consideration that the patient might be at risk for infection with the SARS-CoV-2 virus that causes COVID-19. Institutional protocols and algorithms that pertain to the evaluation of patients at risk for COVID-19 are in a state of rapid change based on information released by regulatory bodies including the CDC and federal and state organizations. These policies and algorithms were followed during the patient's care in the ED.  Some ED evaluations and interventions may be delayed as a result of limited staffing during the pandemic.*  Note:  This document was prepared using Dragon voice recognition software and may include unintentional dictation errors.   Gregor Hams, MD 04/03/20 Reece Agar

## 2020-04-04 ENCOUNTER — Telehealth: Payer: Self-pay

## 2020-04-04 DIAGNOSIS — I10 Essential (primary) hypertension: Secondary | ICD-10-CM

## 2020-04-04 MED ORDER — AMLODIPINE BESYLATE 5 MG PO TABS: 5.0000 mg | ORAL_TABLET | Freq: Every day | ORAL | 5 refills | Status: DC

## 2020-04-04 NOTE — Telephone Encounter (Signed)
-----   Message from Birdie Sons, MD sent at 04/04/2020  8:01 AM EDT ----- MRI shows progressive signs of alzheimer's. Also signs of ischemia which is poor blood supply to parts of the brain. This is made worse by hypertension. Need to start back on amlodipine 5mg  daily #30 rf x 5 to help with blood pressure.  Need proceed with neurology referral. They should be hearing from neurologists office this week about referral that was placed at last visit.

## 2020-04-04 NOTE — Telephone Encounter (Signed)
Patient's wife [name] advised and verbalized understanding. She agrees with treatment plan. Prescription sent into pharmacy. [name] wanted to know if the Amlodipine was to take the place of HCTZ that he is already taking? I advised [name] that patient should continue to take HCTZ in addition to taking Amlodipine.

## 2020-05-04 ENCOUNTER — Other Ambulatory Visit: Payer: Self-pay | Admitting: Family Medicine

## 2020-05-04 DIAGNOSIS — F410 Panic disorder [episodic paroxysmal anxiety] without agoraphobia: Secondary | ICD-10-CM

## 2020-05-18 DIAGNOSIS — F0280 Dementia in other diseases classified elsewhere without behavioral disturbance: Secondary | ICD-10-CM | POA: Insufficient documentation

## 2020-07-14 ENCOUNTER — Other Ambulatory Visit: Payer: Self-pay | Admitting: Family Medicine

## 2020-07-14 DIAGNOSIS — F0390 Unspecified dementia without behavioral disturbance: Secondary | ICD-10-CM

## 2020-07-14 NOTE — Telephone Encounter (Signed)
Requested Prescriptions  Pending Prescriptions Disp Refills   rivastigmine (EXELON) 6 MG capsule [Pharmacy Med Name: RIVASTIGMINE 6 MG CAPSULE] 180 capsule 4    Sig: TAKE 1 CAPSULE BY MOUTH TWICE A DAY FOR 14 DAYS THEN INCREASE TO 2 CAPSULES BY MOUTH DAILY     Neurology:  Alzheimer's Agents Passed - 07/14/2020 10:48 AM      Passed - Valid encounter within last 6 months    Recent Outpatient Visits          3 months ago Dementia without behavioral disturbance, unspecified dementia type [facility])   [facility] Birdie Sons, MD   5 months ago Prediabetes   [facility] Birdie Sons, MD   9 months ago Dementia without behavioral disturbance, unspecified dementia type [facility])   [facility] Birdie Sons, MD   11 months ago Other fatigue   [facility] Birdie Sons, MD   11 months ago Prostatitis, unspecified prostatitis type   [facility] Jerrol Banana., MD      Future Appointments            In 2 weeks Fisher, Kirstie Peri, MD [facility], [place]

## 2020-07-19 ENCOUNTER — Ambulatory Visit: Payer: Medicare Other | Admitting: Physician Assistant

## 2020-07-19 ENCOUNTER — Telehealth: Payer: Self-pay | Admitting: Family Medicine

## 2020-07-19 NOTE — Telephone Encounter (Signed)
Copied from [place] [id number]. Topic: Medicare AWV >> Jul 19, 2020 11:28 AM Cher Nakai R wrote: Reason for CRM: Left message for patient to call back and schedule Medicare Annual Wellness Visit (AWV) either virtually or in office.  Last AWV 07/18/2019  Please schedule at anytime with [facility] Health Advisor.

## 2020-07-23 ENCOUNTER — Other Ambulatory Visit: Payer: Self-pay | Admitting: Family Medicine

## 2020-07-23 DIAGNOSIS — N50819 Testicular pain, unspecified: Secondary | ICD-10-CM

## 2020-07-23 NOTE — Telephone Encounter (Signed)
Requested Prescriptions  Pending Prescriptions Disp Refills  . gabapentin (NEURONTIN) 300 MG capsule [Pharmacy Med Name: GABAPENTIN 300 MG CAPSULE] 180 capsule 1    Sig: TAKE 1 CAPSULE BY MOUTH TWICE A DAY     Neurology: Anticonvulsants - gabapentin Passed - 07/23/2020  8:01 PM      Passed - Valid encounter within last 12 months    Recent Outpatient Visits          4 months ago Dementia without behavioral disturbance, unspecified dementia type [facility])   [facility] Birdie Sons, MD   5 months ago Prediabetes   [facility] Birdie Sons, MD   9 months ago Dementia without behavioral disturbance, unspecified dementia type [facility])   [facility] Birdie Sons, MD   11 months ago Other fatigue   [facility] Birdie Sons, MD   12 months ago Prostatitis, unspecified prostatitis type   [facility] Jerrol Banana., MD      Future Appointments            In 1 week Fisher, Kirstie Peri, MD [facility], [place]

## 2020-08-01 NOTE — Progress Notes (Signed)
Established patient visit   Patient: Luke [name]   DOB: 09-06-1946   74 y.o. Male  MRN: [id number] Visit Date: 08/03/2020  Today's healthcare provider: Lelon Huh, MD   Chief Complaint  Patient presents with  . Hypertension  . Dementia   I,Luke [name],acting as a scribe for Luke Huh, MD.,have documented all relevant documentation on the behalf of Luke Huh, MD,as directed by  Luke Huh, MD while in the presence of Luke Huh, MD.  Subjective    HPI  Hypertension, follow-up  BP Readings from Last 3 Encounters:  08/03/20 139/81  04/03/20 (!) 188/93  03/20/20 (!) 156/73   Wt Readings from Last 3 Encounters:  08/03/20 216 lb 9.6 oz (98.2 kg)  04/02/20 220 lb (99.8 kg)  03/20/20 214 lb 12.8 oz (97.4 kg)     He was last seen for hypertension 4 months ago.  BP at that visit was 156/73. Management since that visit includes patient advised to start back on Amlodipine 5 mg.  He reports good compliance with treatment. He is not having side effects.  He is following a Regular diet. He is not exercising. He does not smoke.  Use of agents associated with hypertension: none.   Outside blood pressures are being checked occasionally. Symptoms: No chest pain No chest pressure  No palpitations No syncope  No dyspnea No orthopnea  No paroxysmal nocturnal dyspnea No lower extremity edema   Pertinent labs: Lab Results  Component Value Date   CHOL 170 08/08/2019   HDL 46 08/08/2019   LDLCALC 98 08/08/2019   TRIG 128 08/08/2019   CHOLHDL 3.7 08/08/2019   Lab Results  Component Value Date   NA 137 04/02/2020   K 3.4 (L) 04/02/2020   CREATININE 1.19 04/02/2020   GFRNONAA >60 04/02/2020   GFRAA >60 04/02/2020   GLUCOSE 87 04/02/2020     The 10-year ASCVD risk score Luke [name] DC Jr., et al., 2013) is: 23.4%   --------------------------------------------------------------------------------------------------- Follow up for Dementia:  The patient  was last seen for this 4 months ago. Changes made at last visit include MRI showed progressive signs of Alzheimer's and Ischemia. Patient advised to start back on Amlodipine and follow up with Neurology. His wife feels he is doing about the same. Has good days and bad days which he much more confused.  He reports good compliance with treatment. He feels that condition is Unchanged. He is not having side effects.   ----------------------------------------------------------------------------------------- He also continues to have occasional groin pains with history of large hydroceles. Was last seen by Dr. Bernardo [name] in 2020. Has not really gotten any worse since then.      Medications: Outpatient Medications Prior to Visit  Medication Sig  . amLODipine (NORVASC) 5 MG tablet Take 1 tablet (5 mg total) by mouth daily.  . Cholecalciferol (VITAMIN D) 2000 units CAPS Take 1 capsule by mouth daily. occasionally  . gabapentin (NEURONTIN) 300 MG capsule TAKE 1 CAPSULE BY MOUTH TWICE A DAY  . hydrochlorothiazide (MICROZIDE) 12.5 MG capsule Take 12.5 mg by mouth daily.  . memantine (NAMENDA) 5 MG tablet Take by mouth.  . rivastigmine (EXELON) 6 MG capsule TAKE 1 CAPSULE BY MOUTH TWICE A DAY FOR 14 DAYS THEN INCREASE TO 2 CAPSULES BY MOUTH DAILY  . sertraline (ZOLOFT) 100 MG tablet TAKE 1 TABLET (100 MG TOTAL) BY MOUTH AS NEEDED.  . [DISCONTINUED] hydrochlorothiazide (HYDRODIURIL) 12.5 MG tablet TAKE 1 TABLET BY MOUTH EVERY DAY  . [DISCONTINUED] Multiple Vitamin (MULTIVITAMIN)  tablet Take 1 tablet by mouth daily. occasionally  . [DISCONTINUED] sildenafil (VIAGRA) 100 MG tablet Take 100 mg by mouth as directed.  . [DISCONTINUED] traMADol (ULTRAM) 50 MG tablet TAKE 1 TABLET BY MOUTH EVERY 8 (EIGHT) HOURS AS NEEDED FOR UP TO 5 DAYS. (7 DS PER INSURANCE)   No facility-administered medications prior to visit.    Review of Systems  Constitutional: Negative.   Respiratory: Negative.   Cardiovascular:  Negative.   Musculoskeletal: Negative.   Neurological:       Dementia      Objective    BP 139/81 (BP Location: Right Arm, Patient Position: Sitting, Cuff Size: Large)   Pulse 65   Temp 97.9 F (36.6 C) (Oral)   Ht 5\' 8"  (1.727 m)   Wt 216 lb 9.6 oz (98.2 kg)   BMI 32.93 kg/m   Physical Exam   General: Appearance:    Obese male in no acute distress  Eyes:    PERRL, conjunctiva/corneas clear, EOM's intact       Lungs:     Clear to auscultation bilaterally, respirations unlabored  Heart:    Normal heart rate. Normal rhythm. No murmurs, rubs, or gallops.   MS:   All extremities are intact.   Neurologic:   Awake, alert, oriented x 2. No apparent focal neurological           defect.          Assessment & Plan     1. Essential hypertension Well controlled, better with addition of amlodipine. Continue current medications.    2. Mixed Alzheimer's and vascular dementia (Luke [name]) Stable on current medications. Follow up Dr. Manuella [name] in Sept as scheduled.   3. Scrotal pain Chronic, possible related to large hydroceles. No significant change since last urology follow up.    Future Appointments  Date Time Provider [place]  02/05/2021 11:00 AM Luke [name], Luke Peri, MD [facility]         The entirety of the information documented in the History of Present Illness, Review of Systems and Physical Exam were personally obtained by me. Portions of this information were initially documented by the CMA and reviewed by me for thoroughness and accuracy.      Luke Huh, MD  Luke [name] [phone] (phone) [phone] (fax)  [place]

## 2020-08-03 ENCOUNTER — Other Ambulatory Visit: Payer: Self-pay

## 2020-08-03 ENCOUNTER — Encounter: Payer: Self-pay | Admitting: Family Medicine

## 2020-08-03 ENCOUNTER — Ambulatory Visit (INDEPENDENT_AMBULATORY_CARE_PROVIDER_SITE_OTHER): Payer: Medicare Other | Admitting: Family Medicine

## 2020-08-03 VITALS — BP 139/81 | HR 65 | Temp 97.9°F | Ht 68.0 in | Wt 216.6 lb

## 2020-08-03 DIAGNOSIS — G309 Alzheimer's disease, unspecified: Secondary | ICD-10-CM

## 2020-08-03 DIAGNOSIS — I10 Essential (primary) hypertension: Secondary | ICD-10-CM

## 2020-08-03 DIAGNOSIS — N5082 Scrotal pain: Secondary | ICD-10-CM | POA: Diagnosis not present

## 2020-08-03 DIAGNOSIS — F0150 Vascular dementia without behavioral disturbance: Secondary | ICD-10-CM | POA: Diagnosis not present

## 2020-08-03 DIAGNOSIS — F0280 Dementia in other diseases classified elsewhere without behavioral disturbance: Secondary | ICD-10-CM

## 2020-08-03 NOTE — Patient Instructions (Addendum)
.   Try OTC alpha-lipoic acid 600 three a day to see if it helps with neuropathy. If it's not helping after a month you can stop taking it.   Please bring your Covid vaccine card to your next appointment so we can update your medical record    You can take up to 3 ibuprofen 200mg  tablets a few times a week as needed for pelvic pain

## 2020-08-24 ENCOUNTER — Telehealth: Payer: Self-pay | Admitting: Family Medicine

## 2020-08-24 NOTE — Telephone Encounter (Signed)
Copied from [place] [id number]. Topic: Medicare AWV >> Aug 24, 2020 11:04 AM Cher Nakai R wrote: Reason for CRM:  Left message for patient to call back and schedule Medicare Annual Wellness Visit (AWV) either virtually or in office.  Last AWV 07/18/2019  Please schedule at anytime with [facility] Advisor.  If any questions, please contact me at [phone]

## 2020-09-10 NOTE — Progress Notes (Signed)
Subjective:   Luke [name] is a 74 y.o. male who presents for Medicare Annual/Subsequent preventive examination.  I connected with Luke [name] today by telephone and verified that I am speaking with the correct person using two identifiers. Location patient: home Location provider: work Persons participating in the virtual visit: patient, provider.   I discussed the limitations, risks, security and privacy concerns of performing an evaluation and management service by telephone and the availability of in person appointments. I also discussed with the patient that there may be a patient responsible charge related to this service. The patient expressed understanding and verbally consented to this telephonic visit.    Interactive audio and video telecommunications were attempted between this provider and patient, however failed, due to patient having technical difficulties OR patient did not have access to video capability.  We continued and completed visit with audio only.   Review of Systems    N/A  Cardiac Risk Factors include: advanced age (>40men, >15 women);male gender;hypertension     Objective:    There were no vitals filed for this visit. There is no height or weight on file to calculate BMI.  Advanced Directives 09/11/2020 04/02/2020 07/18/2019 06/13/2019 03/01/2019 06/02/2018 05/12/2017  Does Patient Have a Medical Advance Directive? No No No Yes No No Yes  Type of Advance Directive - - - Living will;Healthcare Power of [place];Living will  Does patient want to make changes to medical advance directive? - - - No - Patient declined - - -  Copy of [place] in Chart? - - - No - copy requested - - No - copy requested  Would patient like information on creating a medical advance directive? No - Patient declined No - Patient declined Yes (MAU/Ambulatory/Procedural Areas - Information given) - - Yes (MAU/Ambulatory/Procedural Areas -  Information given) -    Current Medications (verified) Outpatient Encounter Medications as of 09/11/2020  Medication Sig  . amLODipine (NORVASC) 5 MG tablet Take 1 tablet (5 mg total) by mouth daily.  . Cholecalciferol (VITAMIN D) 2000 units CAPS Take 1 capsule by mouth daily. occasionally  . gabapentin (NEURONTIN) 300 MG capsule TAKE 1 CAPSULE BY MOUTH TWICE A DAY  . hydrochlorothiazide (MICROZIDE) 12.5 MG capsule Take 12.5 mg by mouth daily.  . memantine (NAMENDA) 5 MG tablet Take 5 mg by mouth daily.   . rivastigmine (EXELON) 6 MG capsule TAKE 1 CAPSULE BY MOUTH TWICE A DAY FOR 14 DAYS THEN INCREASE TO 2 CAPSULES BY MOUTH DAILY  . sertraline (ZOLOFT) 100 MG tablet TAKE 1 TABLET (100 MG TOTAL) BY MOUTH AS NEEDED.   No facility-administered encounter medications on file as of 09/11/2020.    Allergies (verified) Donepezil   History: Past Medical History:  Diagnosis Date  . Acquired absence of kidney 12/29/1988  . Brachial neuritis 04/02/2009  . Claudication ([place]) 02/06/2016  . 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 Dysphagia 04/02/2009  . GERD (gastroesophageal reflux disease) 02/06/2016   Schatski ring seen on EGD 07-10-11   . History of MRSA infection 11/18/2007  . Hyperlipidemia, mixed 12/29/1998  . Hypertension 04/02/2009  . Hypogonadism male 02/06/2016  . Insomnia 02/06/2016  . LBP (low back pain) 07/13/2013  . Numbness of toes 02/06/2016  . Palpitations 02/06/2016  . Prediabetes 02/08/2016   A1c=6.2 02/08/16   . Prostate cancer ([place]) 2014   Seed Implants  . Single kidney   . Vitamin D deficiency 02/06/2016   Past Surgical  History:  Procedure Laterality Date  . ABI  07/09/2011   Normal; Left= 1.21, Right=1.00. Triphasic waver forms  . BALLOON DILATION N/A 06/13/2019   Procedure: BALLOON DILATION;  Surgeon: Lucilla Lame, MD;  Location: [place];  Service: Endoscopy;  Laterality: N/A;  . Carotid Doppler Ultrasound  10/18/2012   Minimal soft plague both carotids. No  hemodynamically significant stenosis  . CHOLECYSTECTOMY  2001  . COLONOSCOPY WITH PROPOFOL N/A 06/13/2019   Procedure: COLONOSCOPY WITH BIOPSIES;  Surgeon: Lucilla Lame, MD;  Location: [place];  Service: Endoscopy;  Laterality: N/A;  . ESOPHAGOGASTRODUODENOSCOPY (EGD) WITH PROPOFOL N/A 06/13/2019   Procedure: ESOPHAGOGASTRODUODENOSCOPY (EGD) WITH BALLOON DILATION;  Surgeon: Lucilla Lame, MD;  Location: [place];  Service: Endoscopy;  Laterality: N/A;  . Lumbar Spine X Ray  11/28/2011   Mild DDD L1- L2. Anterior heigt loss of T12-L1 vertebral bodies  . NEPHRECTOMY Right 2001   done by Dr. Eliberto Ivory for Angiomyolipoma  . PENILE PROSTHESIS  REMOVAL  11/28/2013   Dr. Eliberto Ivory  . PENILE PROSTHESIS IMPLANT  2008  . Photovaporization of prostate with green light laser  01/19/2012   Dr. Rogers Blocker  . POLYPECTOMY N/A 06/13/2019   Procedure: POLYPECTOMY;  Surgeon: Lucilla Lame, MD;  Location: [place];  Service: Endoscopy;  Laterality: N/A;  . [place]  . UPPER GI ENDOSCOPY  07/10/2011   Done by Dr. Sharen Counter. Revealed Schatski rings   Family History  Problem Relation Age of Onset  . Heart attack Mother   . Hypertension Father   . Prostate cancer Neg Hx   . Bladder Cancer Neg Hx   . Kidney cancer Neg Hx    Social History   Socioeconomic History  . Marital status: Married    Spouse name: Not on file  . Number of children: 2  . Years of education: Not on file  . Highest education level: 10th grade  Occupational History  . Occupation: Retired  Tobacco Use  . Smoking status: Never Smoker  . Smokeless tobacco: Never Used  . Tobacco comment: smokes a Lexicographer with friends  Vaping Use  . Vaping Use: Never used  Substance and Sexual Activity  . Alcohol use: Yes    Alcohol/week: 0.0 standard drinks    Comment: occasional / monthly  . Drug use: No  . Sexual activity: Not Currently  Other Topics Concern  . Not on file  Social History Narrative    . Not on file   Social Determinants of Health   Financial Resource Strain: Low Risk   . Difficulty of Paying Living Expenses: Not hard at all  Food Insecurity: No Food Insecurity  . Worried About Charity fundraiser in the Last Year: Never true  . Ran Out of Food in the Last Year: Never true  Transportation Needs: No Transportation Needs  . Lack of Transportation (Medical): No  . Lack of Transportation (Non-Medical): No  Physical Activity: Inactive  . Days of Exercise per Week: 0 days  . Minutes of Exercise per Session: 0 min  Stress: No Stress Concern Present  . Feeling of Stress : Only a little  Social Connections: Moderately Integrated  . Frequency of Communication with Friends and Family: Three times a week  . Frequency of Social Gatherings with Friends and Family: More than three times a week  . Attends Religious Services: Never  . Active Member of Clubs or Organizations: Yes  . Attends Archivist Meetings: Never  . Marital  Status: Married    Tobacco Counseling Counseling given: Not Answered Comment: smokes a Lexicographer with friends   Clinical Intake:  Pre-visit preparation completed: Yes  Pain : No/denies pain     Nutritional Risks: None Diabetes: No  How often do you need to have someone help you when you read instructions, pamphlets, or other written materials from your doctor or pharmacy?: 1 - Never  Diabetic? No  Interpreter Needed?: No  Information entered by :: [facility], LPN   Activities of Daily Living In your present state of health, do you have any difficulty performing the following activities: 09/11/2020 08/03/2020  Hearing? Y Y  Comment Has hearing aids but does not wear them. -  Vision? N N  Difficulty concentrating or making decisions? Y Y  Comment Has dementia. -  Walking or climbing stairs? N N  Dressing or bathing? N N  Doing errands, shopping? N Y  Conservation officer, nature and eating ? Y -  Comment Wife does all the  cooking. -  Using the Toilet? N -  In the past six months, have you accidently leaked urine? N -  Do you have problems with loss of bowel control? N -  Managing your Medications? Y -  Comment Wife prepares medications in pill box every week. -  Managing your Finances? Y -  Comment Wife manages finances. -  Housekeeping or managing your Housekeeping? N -  Some recent data might be hidden    Patient Care Team: Birdie Sons, MD as PCP - General (Family Medicine) Abbie Sons, MD as Consulting Physician (Urology) Lucilla Lame, MD as Consulting Physician (Gastroenterology) Neldon Labella, RN as Case Manager [place], [place] (Optometry) Vladimir Crofts, MD as Consulting Physician (Neurology)  Indicate any recent Medical Services you may have received from other than [facility] providers in the past year (date may be approximate).     Assessment:   This is a routine wellness examination for [place].  Hearing/Vision screen No exam data present  Dietary issues and exercise activities discussed: Current Exercise Habits: The patient does not participate in regular exercise at present, Exercise limited by: Other - see comments (dementia)  Goals    . DIET - INCREASE WATER INTAKE     Recommend increasing water intake to 4 glasses a day.       Depression Screen PHQ 2/9 Scores 09/11/2020 08/03/2020 07/18/2019 06/02/2018 05/12/2017 05/12/2017 02/07/2016  PHQ - 2 Score 1 0 2 4 1 1  0  PHQ- 9 Score - - 5 13 9  - -    Fall Risk Fall Risk  09/11/2020 08/03/2020 07/18/2019 06/02/2018 05/12/2017  Falls in the past year? 0 0 0 No No  Number falls in past yr: 0 0 - - -  Injury with Fall? 0 0 - - -    Any stairs in or around the home? No  If so, are there any without handrails? No  Home free of loose throw rugs in walkways, pet beds, electrical cords, etc? Yes  Adequate lighting in your home to reduce risk of falls? Yes   ASSISTIVE DEVICES UTILIZED TO PREVENT FALLS:  Life alert? No  Use of a  cane, walker or w/c? No  Grab bars in the bathroom? Yes  Shower chair or bench in shower? Yes  Elevated toilet seat or a handicapped toilet? Yes    Cognitive Function: Unable to complete due to speaking with wife.     6CIT Screen 05/12/2017  What Year? 4 points  What month? 3 points  What time? 0 points  Count back from 20 0 points  Months in reverse 4 points  Repeat phrase 10 points  Total Score 21    Immunizations Immunization History  Administered Date(s) Administered  . Fluad Quad(high Dose 65+) 10/03/2019  . Influenza, High Dose Seasonal PF 11/30/2015, 09/09/2016, 09/01/2017, 09/17/2018  . Influenza-Unspecified 11/28/2013  . PFIZER SARS-COV-2 Vaccination 02/07/2020, 02/28/2020  . Pneumococcal Conjugate-13 02/07/2016  . Pneumococcal Polysaccharide-23 04/30/2010, 05/12/2017  . Tdap 04/30/2010    TDAP status: Due, Education has been provided regarding the importance of this vaccine. Advised may receive this vaccine at local pharmacy or Health Dept. Aware to provide a copy of the vaccination record if obtained from local pharmacy or Health Dept. Verbalized acceptance and understanding. Flu Vaccine status: Declined, Education has been provided regarding the importance of this vaccine but patient still declined. Advised may receive this vaccine at local pharmacy or Health Dept. Aware to provide a copy of the vaccination record if obtained from local pharmacy or Health Dept. Verbalized acceptance and understanding. Pneumococcal vaccine status: Up to date Covid-19 vaccine status: Completed vaccines  Qualifies for Shingles Vaccine? Yes   Zostavax completed No   Shingrix Completed?: No.    Education has been provided regarding the importance of this vaccine. Patient has been advised to call insurance company to determine out of pocket expense if they have not yet received this vaccine. Advised may also receive vaccine at local pharmacy or Health Dept. Verbalized acceptance and  understanding.  Screening Tests Health Maintenance  Topic Date Due  . INFLUENZA VACCINE  07/29/2020  . TETANUS/TDAP  09/11/2021 (Originally 04/30/2020)  . COLONOSCOPY  06/12/2024  . COVID-19 Vaccine  Completed  . Hepatitis C Screening  Completed  . PNA vac Low Risk Adult  Completed    Health Maintenance  Health Maintenance Due  Topic Date Due  . INFLUENZA VACCINE  07/29/2020    Colorectal cancer screening: Completed 06/13/19. Repeat every 5 years  Lung Cancer Screening: (Low Dose CT Chest recommended if Age 47-80 years, 30 pack-year currently smoking OR have quit w/in 15years.) does not qualify.   Additional Screening:  Hepatitis C Screening: Up to date  Vision Screening: Recommended annual ophthalmology exams for early detection of glaucoma and other disorders of the eye. Is the patient up to date with their annual eye exam?  Yes  Who is the provider or what is the name of the office in which the patient attends annual eye exams? [facility] If pt is not established with a provider, would they like to be referred to a provider to establish care? No .   Dental Screening: Recommended annual dental exams for proper oral hygiene  Community Resource Referral / Chronic Care Management: CRR required this visit?  No   CCM required this visit?  No      Plan:     I have personally reviewed and noted the following in the patient's chart:   . Medical and social history . Use of alcohol, tobacco or illicit drugs  . Current medications and supplements . Functional ability and status . Nutritional status . Physical activity . Advanced directives . List of other physicians . Hospitalizations, surgeries, and ER visits in previous 12 months . Vitals . Screenings to include cognitive, depression, and falls . Referrals and appointments  In addition, I have reviewed and discussed with patient certain preventive protocols, quality metrics, and best practice  recommendations. A written personalized care plan for  preventive services as well as general preventive health recommendations were provided to patient.     Aaradhya Kysar [employer] [place]   [id number]   Nurse Notes: Pt to receive flu shot at flu clinic tomorrow.

## 2020-09-11 ENCOUNTER — Other Ambulatory Visit: Payer: Self-pay

## 2020-09-11 ENCOUNTER — Ambulatory Visit (INDEPENDENT_AMBULATORY_CARE_PROVIDER_SITE_OTHER): Payer: Medicare Other

## 2020-09-11 DIAGNOSIS — Z0000 Encounter for general adult medical examination without abnormal findings: Secondary | ICD-10-CM | POA: Diagnosis not present

## 2020-09-11 NOTE — Patient Instructions (Signed)
Luke [name] , Thank you for taking time to come for your Medicare Wellness Visit. I appreciate your ongoing commitment to your health goals. Please review the following plan we discussed and let me know if I can assist you in the future.   Screening recommendations/referrals: Colonoscopy: Up to date, due 05/2024 Recommended yearly ophthalmology/optometry visit for glaucoma screening and checkup Recommended yearly dental visit for hygiene and checkup  Vaccinations: Influenza vaccine: Currently due Pneumococcal vaccine: Completed series Tdap vaccine: Currently due, declined at this time.  Shingles vaccine: Shingrix discussed. Please contact your pharmacy for coverage information.     Advanced directives: Advance directive discussed with you today. Even though you declined this today please call our office should you change your mind and we can give you the proper paperwork for you to fill out.  Conditions/risks identified: Recommend to increase water intake to 6-8 8 oz glasses a day.  Next appointment: Tomorrow, 09/12/20 @ 3:50 PM for a flu shot. Next apt is 02/05/21 @ 11:00 AM with Dr Caryn Section. Declined scheduling an AWV for 2022 at this time.   Preventive Care 40 Years and Older, Male Preventive care refers to lifestyle choices and visits with your health care provider that can promote health and wellness. What does preventive care include?  A yearly physical exam. This is also called an annual well check.  Dental exams once or twice a year.  Routine eye exams. Ask your health care provider how often you should have your eyes checked.  Personal lifestyle choices, including:  Daily care of your teeth and gums.  Regular physical activity.  Eating a healthy diet.  Avoiding tobacco and drug use.  Limiting alcohol use.  Practicing safe sex.  Taking low doses of aspirin every day.  Taking vitamin and mineral supplements as recommended by your health care provider. What happens  during an annual well check? The services and screenings done by your health care provider during your annual well check will depend on your age, overall health, lifestyle risk factors, and family history of disease. Counseling  Your health care provider may ask you questions about your:  Alcohol use.  Tobacco use.  Drug use.  Emotional well-being.  Home and relationship well-being.  Sexual activity.  Eating habits.  History of falls.  Memory and ability to understand (cognition).  Work and work Statistician. Screening  You may have the following tests or measurements:  Height, weight, and BMI.  Blood pressure.  Lipid and cholesterol levels. These may be checked every 5 years, or more frequently if you are over 56 years old.  Skin check.  Lung cancer screening. You may have this screening every year starting at age 58 if you have a 30-pack-year history of smoking and currently smoke or have quit within the past 15 years.  Fecal occult blood test (FOBT) of the stool. You may have this test every year starting at age 72.  Flexible sigmoidoscopy or colonoscopy. You may have a sigmoidoscopy every 5 years or a colonoscopy every 10 years starting at age 44.  Prostate cancer screening. Recommendations will vary depending on your family history and other risks.  Hepatitis C blood test.  Hepatitis B blood test.  Sexually transmitted disease (STD) testing.  Diabetes screening. This is done by checking your blood sugar (glucose) after you have not eaten for a while (fasting). You may have this done every 1-3 years.  Abdominal aortic aneurysm (AAA) screening. You may need this if you are a current or former  smoker.  Osteoporosis. You may be screened starting at age 53 if you are at high risk. Talk with your health care provider about your test results, treatment options, and if necessary, the need for more tests. Vaccines  Your health care provider may recommend certain  vaccines, such as:  Influenza vaccine. This is recommended every year.  Tetanus, diphtheria, and acellular pertussis (Tdap, Td) vaccine. You may need a Td booster every 10 years.  Zoster vaccine. You may need this after age 60.  Pneumococcal 13-valent conjugate (PCV13) vaccine. One dose is recommended after age 74.  Pneumococcal polysaccharide (PPSV23) vaccine. One dose is recommended after age 43. Talk to your health care provider about which screenings and vaccines you need and how often you need them. This information is not intended to replace advice given to you by your health care provider. Make sure you discuss any questions you have with your health care provider. Document Released: 01/11/2016 Document Revised: 09/03/2016 Document Reviewed: 10/16/2015 Elsevier Interactive Patient Education  2017 [place] Prevention in the Home Falls can cause injuries. They can happen to people of all ages. There are many things you can do to make your home safe and to help prevent falls. What can I do on the outside of my home?  Regularly fix the edges of walkways and driveways and fix any cracks.  Remove anything that might make you trip as you walk through a door, such as a raised step or threshold.  Trim any bushes or trees on the path to your home.  Use bright outdoor lighting.  Clear any walking paths of anything that might make someone trip, such as rocks or tools.  Regularly check to see if handrails are loose or broken. Make sure that both sides of any steps have handrails.  Any raised decks and porches should have guardrails on the edges.  Have any leaves, snow, or ice cleared regularly.  Use sand or salt on walking paths during winter.  Clean up any spills in your garage right away. This includes oil or grease spills. What can I do in the bathroom?  Use night lights.  Install grab bars by the toilet and in the tub and shower. Do not use towel bars as grab  bars.  Use non-skid mats or decals in the tub or shower.  If you need to sit down in the shower, use a plastic, non-slip stool.  Keep the floor dry. Clean up any water that spills on the floor as soon as it happens.  Remove soap buildup in the tub or shower regularly.  Attach bath mats securely with double-sided non-slip rug tape.  Do not have throw rugs and other things on the floor that can make you trip. What can I do in the bedroom?  Use night lights.  Make sure that you have a light by your bed that is easy to reach.  Do not use any sheets or blankets that are too big for your bed. They should not hang down onto the floor.  Have a firm chair that has side arms. You can use this for support while you get dressed.  Do not have throw rugs and other things on the floor that can make you trip. What can I do in the kitchen?  Clean up any spills right away.  Avoid walking on wet floors.  Keep items that you use a lot in easy-to-reach places.  If you need to reach something above you, use a  strong step stool that has a grab bar.  Keep electrical cords out of the way.  Do not use floor polish or wax that makes floors slippery. If you must use wax, use non-skid floor wax.  Do not have throw rugs and other things on the floor that can make you trip. What can I do with my stairs?  Do not leave any items on the stairs.  Make sure that there are handrails on both sides of the stairs and use them. Fix handrails that are broken or loose. Make sure that handrails are as long as the stairways.  Check any carpeting to make sure that it is firmly attached to the stairs. Fix any carpet that is loose or worn.  Avoid having throw rugs at the top or bottom of the stairs. If you do have throw rugs, attach them to the floor with carpet tape.  Make sure that you have a light switch at the top of the stairs and the bottom of the stairs. If you do not have them, ask someone to add them for  you. What else can I do to help prevent falls?  Wear shoes that:  Do not have high heels.  Have rubber bottoms.  Are comfortable and fit you well.  Are closed at the toe. Do not wear sandals.  If you use a stepladder:  Make sure that it is fully opened. Do not climb a closed stepladder.  Make sure that both sides of the stepladder are locked into place.  Ask someone to hold it for you, if possible.  Clearly mark and make sure that you can see:  Any grab bars or handrails.  First and last steps.  Where the edge of each step is.  Use tools that help you move around (mobility aids) if they are needed. These include:  Canes.  Walkers.  Scooters.  Crutches.  Turn on the lights when you go into a dark area. Replace any light bulbs as soon as they burn out.  Set up your furniture so you have a clear path. Avoid moving your furniture around.  If any of your floors are uneven, fix them.  If there are any pets around you, be aware of where they are.  Review your medicines with your doctor. Some medicines can make you feel dizzy. This can increase your chance of falling. Ask your doctor what other things that you can do to help prevent falls. This information is not intended to replace advice given to you by your health care provider. Make sure you discuss any questions you have with your health care provider. Document Released: 10/11/2009 Document Revised: 05/22/2016 Document Reviewed: 01/19/2015 Elsevier Interactive Patient Education  2017 [employer].

## 2020-09-12 ENCOUNTER — Ambulatory Visit (INDEPENDENT_AMBULATORY_CARE_PROVIDER_SITE_OTHER): Payer: Medicare Other

## 2020-09-12 ENCOUNTER — Other Ambulatory Visit: Payer: Self-pay

## 2020-09-12 DIAGNOSIS — Z23 Encounter for immunization: Secondary | ICD-10-CM

## 2020-10-10 ENCOUNTER — Other Ambulatory Visit: Payer: Self-pay | Admitting: Family Medicine

## 2020-10-10 DIAGNOSIS — I10 Essential (primary) hypertension: Secondary | ICD-10-CM

## 2020-10-15 ENCOUNTER — Encounter: Payer: Self-pay | Admitting: Physician Assistant

## 2020-10-15 ENCOUNTER — Other Ambulatory Visit: Payer: Self-pay

## 2020-10-15 ENCOUNTER — Ambulatory Visit (INDEPENDENT_AMBULATORY_CARE_PROVIDER_SITE_OTHER): Payer: Medicare Other | Admitting: Physician Assistant

## 2020-10-15 VITALS — BP 142/111 | HR 65 | Resp 16 | Wt 219.0 lb

## 2020-10-15 DIAGNOSIS — Z8546 Personal history of malignant neoplasm of prostate: Secondary | ICD-10-CM | POA: Diagnosis not present

## 2020-10-15 DIAGNOSIS — R8290 Unspecified abnormal findings in urine: Secondary | ICD-10-CM | POA: Diagnosis not present

## 2020-10-15 DIAGNOSIS — R350 Frequency of micturition: Secondary | ICD-10-CM | POA: Diagnosis not present

## 2020-10-15 DIAGNOSIS — L750 Bromhidrosis: Secondary | ICD-10-CM | POA: Diagnosis not present

## 2020-10-15 LAB — POCT URINALYSIS DIPSTICK
Blood, UA: NEGATIVE
Glucose, UA: NEGATIVE
Nitrite, UA: NEGATIVE
Protein, UA: NEGATIVE
Spec Grav, UA: 1.02 (ref 1.010–1.025)
Urobilinogen, UA: 0.2 E.U./dL
pH, UA: 7 (ref 5.0–8.0)

## 2020-10-15 NOTE — Progress Notes (Signed)
Established patient visit   Patient: Luke [name]   DOB: 04-08-1946   74 y.o. Male  MRN: [id number] Visit Date: 10/15/2020  Today's healthcare provider: Trinna Post, PA-C   Chief Complaint  Patient presents with  . Abdominal Pain   Subjective    HPI HPI    1 week of lower quad pain.    Last edited by Lazarus Gowda, CMA on 10/15/2020  2:53 PM. (History)      Patient with history of prostate cancer and dementia presents today for one week of lower abdominal pain, urinary frequency, and diminished stream. Patient's wife provides much of the history today. She reports he has been complaining of lower abdominal pain. Denies nausea and vomiting, reports appetite is still well. Denies fevers, chills, back pain, rectal pain. Denies burning with urination. Patient's wife reports he is getting up more at night and notices a weaker stream. He was followed by Dr. Bernardo Heater and last visit was a year ago with normal PSA.   Patient's wife reports he also has a body odor of maple syrup. Patient's wife denies new supplements.     Medications: Outpatient Medications Prior to Visit  Medication Sig  . amLODipine (NORVASC) 5 MG tablet TAKE 1 TABLET BY MOUTH EVERY DAY  . Cholecalciferol (VITAMIN D) 2000 units CAPS Take 1 capsule by mouth daily. occasionally  . gabapentin (NEURONTIN) 300 MG capsule TAKE 1 CAPSULE BY MOUTH TWICE A DAY  . hydrochlorothiazide (MICROZIDE) 12.5 MG capsule Take 12.5 mg by mouth daily.  . memantine (NAMENDA) 5 MG tablet Take 5 mg by mouth daily.   . rivastigmine (EXELON) 6 MG capsule TAKE 1 CAPSULE BY MOUTH TWICE A DAY FOR 14 DAYS THEN INCREASE TO 2 CAPSULES BY MOUTH DAILY  . sertraline (ZOLOFT) 100 MG tablet TAKE 1 TABLET (100 MG TOTAL) BY MOUTH AS NEEDED.   No facility-administered medications prior to visit.    Review of Systems  Gastrointestinal: Positive for abdominal pain.      Objective    BP (!) 142/111   Pulse 65   Resp 16   Wt 219 lb  (99.3 kg)   SpO2 98%   BMI 33.30 kg/m    Physical Exam Constitutional:      Appearance: Normal appearance.  Cardiovascular:     Rate and Rhythm: Normal rate and regular rhythm.     Heart sounds: Normal heart sounds.  Pulmonary:     Effort: Pulmonary effort is normal.     Breath sounds: Normal breath sounds.  Abdominal:     Palpations: Abdomen is soft.     Tenderness: There is no abdominal tenderness.  Skin:    General: Skin is warm and dry.  Neurological:     Mental Status: He is alert and oriented to person, place, and time. Mental status is at baseline.  Psychiatric:        Mood and Affect: Mood normal.        Behavior: Behavior normal.       No results found for any visits on 10/15/20.  Assessment & Plan    1. Urinary body odor  I am unclear, apart from supplements like fenugreek, why patient would smell like maple syrup and have told the family the same.   2. Abnormal urine odor  Urinalysis and culture are negative for infection.   - POCT urinalysis dipstick  3. Urinary frequency  Considering patient has history of prostate cancer, must be concerned for new malignancy  due to weak stream and frequency. However, PSA is normal. Could possibly be some hypertrophy but since he hasn't seen his urologist in > 1 year, I would recommend follow up with them. I did offer a trial of flomax 0.4 mg QD which family did accept.  - Urinalysis, microscopic only - CULTURE, URINE COMPREHENSIVE  4. History of prostate cancer  - PSA   No follow-ups on file.      ITrinna Post, PA-C, have reviewed all documentation for this visit. The documentation on 10/23/20 for the exam, diagnosis, procedures, and orders are all accurate and complete.  The entirety of the information documented in the History of Present Illness, Review of Systems and Physical Exam were personally obtained by me. Portions of this information were initially documented by Wilburt Finlay, CMA and  reviewed by me for thoroughness and accuracy.   I spent 30 minutes dedicated to the care of this patient on the date of this encounter to include pre-visit review of records, face-to-face time with the patient discussing urinary infection, urinary frequency, BPH vs prostate cancer symptoms and post visit ordering of testing.     Paulene Floor  [facility] [phone] (phone) [phone] (fax)  [place]

## 2020-10-16 LAB — URINALYSIS, MICROSCOPIC ONLY
Bacteria, UA: NONE SEEN
Casts: NONE SEEN /lpf
RBC, Urine: NONE SEEN /hpf (ref 0–2)
WBC, UA: NONE SEEN /hpf (ref 0–5)

## 2020-10-16 LAB — PSA: Prostate Specific Ag, Serum: 0.3 ng/mL (ref 0.0–4.0)

## 2020-10-16 LAB — SPECIMEN STATUS REPORT

## 2020-10-18 ENCOUNTER — Telehealth: Payer: Self-pay

## 2020-10-18 DIAGNOSIS — R3912 Poor urinary stream: Secondary | ICD-10-CM

## 2020-10-18 LAB — CULTURE, URINE COMPREHENSIVE

## 2020-10-18 LAB — SPECIMEN STATUS REPORT

## 2020-10-18 MED ORDER — TAMSULOSIN HCL 0.4 MG PO CAPS: 0.4000 mg | ORAL_CAPSULE | Freq: Every day | ORAL | 0 refills | Status: DC

## 2020-10-18 NOTE — Telephone Encounter (Signed)
Copied from [place] [id number]. Topic: General - Other >> Oct 18, 2020  1:06 PM Rainey Pines A wrote: Patient was advised that Terrilee Croak would send in flomax for patient but pharmacy does not have prescription. Patient would like to know if the generic for Flomax can be sent to pharmacy and would like a callback in regards to what Terrilee Croak is able to do today. Please Arnoldo Hooker

## 2020-10-18 NOTE — Telephone Encounter (Signed)
Ok sent in 90 days, do still recommend they see the urologist.

## 2020-10-18 NOTE — Telephone Encounter (Signed)
When I spoke with the wife earlier, it was my understanding that they wanted to wait. Now, they are wanting to try the Flomax. Can you send this into the pharmacy please? Thanks!

## 2020-11-09 LAB — HM DIABETES EYE EXAM

## 2021-02-05 ENCOUNTER — Ambulatory Visit: Payer: Medicare Other | Admitting: Family Medicine

## 2021-02-05 NOTE — Progress Notes (Deleted)
Established patient visit   Patient: Luke [name]   DOB: Oct 12, 1946   75 y.o. Male  MRN: [id number] Visit Date: 02/05/2021  Today's healthcare provider: Lelon Huh, MD   No chief complaint on file.  Subjective    HPI  Hypertension, follow-up  BP Readings from Last 3 Encounters:  10/15/20 (!) 142/111  08/03/20 139/81  04/03/20 (!) 188/93   Wt Readings from Last 3 Encounters:  10/15/20 219 lb (99.3 kg)  08/03/20 216 lb 9.6 oz (98.2 kg)  04/02/20 220 lb (99.8 kg)     He was last seen for hypertension 6 months ago.  BP at that visit was 139/81. Management since that visit includes continuing same medications.  He reports {excellent/good/fair/poor:19665} compliance with treatment. He {is/is not:9024} having side effects. {document side effects if present:1} He is following a {diet:21022986} diet. He {is/is not:9024} exercising. He {does/does not:200015} smoke.  Use of agents associated with hypertension: none.   Outside blood pressures are {***enter patient reported home BP readings, or 'not being checked':1}. Symptoms: {Yes/No:20286} chest pain {Yes/No:20286} chest pressure  {Yes/No:20286} palpitations {Yes/No:20286} syncope  {Yes/No:20286} dyspnea {Yes/No:20286} orthopnea  {Yes/No:20286} paroxysmal nocturnal dyspnea {Yes/No:20286} lower extremity edema   Pertinent labs: Lab Results  Component Value Date   CHOL 170 08/08/2019   HDL 46 08/08/2019   LDLCALC 98 08/08/2019   TRIG 128 08/08/2019   CHOLHDL 3.7 08/08/2019   Lab Results  Component Value Date   NA 137 04/02/2020   K 3.4 (L) 04/02/2020   CREATININE 1.19 04/02/2020   GFRNONAA >60 04/02/2020   GFRAA >60 04/02/2020   GLUCOSE 87 04/02/2020     The 10-year ASCVD risk score [name]., et al., 2013) is: 28.3%   ---------------------------------------------------------------------------------------------------  Follow up for Alzheimer's disease:  The patient was last seen for this 6 months  ago. Changes made at last visit include none. Follow up with Dr. Manuella Ghazi.   He reports {excellent/good/fair/poor:19665} compliance with treatment. He feels that condition is {improved/worse/unchanged:3041574}. He {is/is not:21021397} having side effects. ***  -----------------------------------------------------------------------------------------   {Show patient history (optional):23778::" "}   Medications: Outpatient Medications Prior to Visit  Medication Sig  . amLODipine (NORVASC) 5 MG tablet TAKE 1 TABLET BY MOUTH EVERY DAY  . Cholecalciferol (VITAMIN D) 2000 units CAPS Take 1 capsule by mouth daily. occasionally  . gabapentin (NEURONTIN) 300 MG capsule TAKE 1 CAPSULE BY MOUTH TWICE A DAY  . hydrochlorothiazide (MICROZIDE) 12.5 MG capsule Take 12.5 mg by mouth daily.  . memantine (NAMENDA) 5 MG tablet Take 5 mg by mouth daily.   . rivastigmine (EXELON) 6 MG capsule TAKE 1 CAPSULE BY MOUTH TWICE A DAY FOR 14 DAYS THEN INCREASE TO 2 CAPSULES BY MOUTH DAILY  . sertraline (ZOLOFT) 100 MG tablet TAKE 1 TABLET (100 MG TOTAL) BY MOUTH AS NEEDED.  Marland Kitchen tamsulosin (FLOMAX) 0.4 MG CAPS capsule Take 1 capsule (0.4 mg total) by mouth daily.   No facility-administered medications prior to visit.    Review of Systems  Constitutional: Negative for appetite change, chills and fever.  Respiratory: Negative for chest tightness, shortness of breath and wheezing.   Cardiovascular: Negative for chest pain and palpitations.  Gastrointestinal: Negative for abdominal pain, nausea and vomiting.    {Labs  Heme  Chem  Endocrine  Serology  Results Review (optional):23779::" "}   Objective    There were no vitals taken for this visit. {Show previous vital signs (optional):23777::" "}   Physical Exam  ***  No  results found for any visits on 02/05/21.  Assessment & Plan     ***  No follow-ups on file.      {provider attestation***:1}   Lelon Huh, MD  [facility] [phone] (phone) [phone] (fax)  [place]

## 2021-03-25 ENCOUNTER — Ambulatory Visit (INDEPENDENT_AMBULATORY_CARE_PROVIDER_SITE_OTHER): Payer: Medicare Other | Admitting: Family Medicine

## 2021-03-25 ENCOUNTER — Other Ambulatory Visit: Payer: Self-pay

## 2021-03-25 VITALS — BP 142/82 | HR 68 | Temp 97.0°F | Wt 233.4 lb

## 2021-03-25 DIAGNOSIS — R350 Frequency of micturition: Secondary | ICD-10-CM | POA: Diagnosis not present

## 2021-03-25 DIAGNOSIS — R109 Unspecified abdominal pain: Secondary | ICD-10-CM

## 2021-03-25 DIAGNOSIS — F5101 Primary insomnia: Secondary | ICD-10-CM

## 2021-03-25 DIAGNOSIS — E559 Vitamin D deficiency, unspecified: Secondary | ICD-10-CM

## 2021-03-25 DIAGNOSIS — Z8546 Personal history of malignant neoplasm of prostate: Secondary | ICD-10-CM

## 2021-03-25 DIAGNOSIS — M25561 Pain in right knee: Secondary | ICD-10-CM | POA: Diagnosis not present

## 2021-03-25 DIAGNOSIS — R7303 Prediabetes: Secondary | ICD-10-CM

## 2021-03-25 DIAGNOSIS — I10 Essential (primary) hypertension: Secondary | ICD-10-CM

## 2021-03-25 LAB — POCT URINALYSIS DIPSTICK
Bilirubin, UA: NEGATIVE
Blood, UA: NEGATIVE
Glucose, UA: NEGATIVE
Ketones, UA: NEGATIVE
Leukocytes, UA: NEGATIVE
Nitrite, UA: NEGATIVE
Protein, UA: NEGATIVE
Spec Grav, UA: 1.02 (ref 1.010–1.025)
Urobilinogen, UA: 0.2 E.U./dL
pH, UA: 6.5 (ref 5.0–8.0)

## 2021-03-25 MED ORDER — TRAZODONE HCL 100 MG PO TABS: 50.0000 mg | ORAL_TABLET | Freq: Every evening | ORAL | 1 refills | Status: DC | PRN

## 2021-03-25 NOTE — Progress Notes (Signed)
Established patient visit   Patient: Luke [name]   DOB: 1946/02/16   75 y.o. Male  MRN: [id number] Visit Date: 03/25/2021  Today's healthcare provider: Lelon Huh, MD   Chief Complaint  Patient presents with  . Knee Pain  I,Porsha C McClurkin,acting as a scribe for Lelon Huh, MD.,have documented all relevant documentation on the behalf of Lelon Huh, MD,as directed by  Lelon Huh, MD while in the presence of Lelon Huh, MD.  Subjective    Knee Pain  The incident occurred more than 1 week ago. There was no injury mechanism. The pain is present in the right knee. The quality of the pain is described as aching. The pain is at a severity of 6/10. The pain is moderate. The pain has been worsening since onset. Associated symptoms include an inability to bear weight. Pertinent negatives include no numbness or tingling. He reports no foreign bodies present. The symptoms are aggravated by movement and weight bearing. He has tried non-weight bearing, acetaminophen and elevation for the symptoms. The treatment provided mild relief.    Patients wife also reports he complain about low left abdominal pain for about 1 week now. She reports patient has frequent urination at times. Patient denies any burning with urination, urgency and difficulty urinating.     Medications: Outpatient Medications Prior to Visit  Medication Sig  . amLODipine (NORVASC) 5 MG tablet TAKE 1 TABLET BY MOUTH EVERY DAY  . Cholecalciferol (VITAMIN D) 2000 units CAPS Take 1 capsule by mouth daily. occasionally  . gabapentin (NEURONTIN) 300 MG capsule TAKE 1 CAPSULE BY MOUTH TWICE A DAY  . hydrochlorothiazide (MICROZIDE) 12.5 MG capsule Take 12.5 mg by mouth daily.  . memantine (NAMENDA) 5 MG tablet Take 5 mg by mouth daily.   . rivastigmine (EXELON) 6 MG capsule TAKE 1 CAPSULE BY MOUTH TWICE A DAY FOR 14 DAYS THEN INCREASE TO 2 CAPSULES BY MOUTH DAILY  . sertraline (ZOLOFT) 100 MG tablet TAKE 1 TABLET  (100 MG TOTAL) BY MOUTH AS NEEDED.  Marland Kitchen tamsulosin (FLOMAX) 0.4 MG CAPS capsule Take 1 capsule (0.4 mg total) by mouth daily. (Patient not taking: Reported on 03/25/2021)   No facility-administered medications prior to visit.    Review of Systems  Gastrointestinal: Positive for abdominal pain. Negative for nausea and vomiting.  Genitourinary: Positive for frequency. Negative for difficulty urinating, dysuria, flank pain and urgency.  Musculoskeletal: Negative for back pain.  Neurological: Negative for tingling, weakness and numbness.       Objective    BP (!) 142/82   Pulse 68   Temp (!) 97 F (36.1 C) (Temporal)   Wt 233 lb 6.4 oz (105.9 kg)   SpO2 100%   BMI 35.49 kg/m     Physical Exam    General: Appearance:    Overweight male in no acute distress  Eyes:    PERRL, conjunctiva/corneas clear, EOM's intact       Lungs:     Clear to auscultation bilaterally, respirations unlabored  Heart:    Normal heart rate. Normal rhythm. No murmurs, rubs, or gallops.   MS:   All extremities are intact. Minimal swelling around right knee compared to left. No erythema. No tenderness. Slight crepitus.   Abd:   Soft, non tender, no masses, no rebound, no guarding.     Results for orders placed or performed in visit on 03/25/21  POCT urinalysis dipstick  Result Value Ref Range   Color, UA Dark Yellow  Clarity, UA Clear    Glucose, UA Negative Negative   Bilirubin, UA Negative    Ketones, UA Negative    Spec Grav, UA 1.020 1.010 - 1.025   Blood, UA Negative    pH, UA 6.5 5.0 - 8.0   Protein, UA Negative Negative   Urobilinogen, UA 0.2 0.2 or 1.0 E.U./dL   Nitrite, UA Negative    Leukocytes, UA Negative Negative   Appearance     Odor         Assessment & Plan     1. Right knee pain, unspecified chronicity Likely OA. Consider NSAID if xray is not severe.  - DG Knee Complete 4 Views Right; Future  2. Primary insomnia At end of visit his wife states he has not been sleeping  through the night well and asks for something to help him sleep better.  - traZODone (DESYREL) 100 MG tablet; Take 0.5-1 tablets (50-100 mg total) by mouth at bedtime as needed for sleep.  Dispense: 30 tablet; Refill: 1  3. Urinary frequency Normal u/a  4. Abdominal pain, unspecified abdominal location Intermittent.  - CBC - Comprehensive metabolic panel  5. History of prostate cancer  - PSA Total (Reflex To Free)  6. Prediabetes  - Hemoglobin A1c  7. Vitamin D deficiency  - VITAMIN D 25 Hydroxy (Vit-D Deficiency, Fractures)  8. Primary hypertension  - Lipid panel - Comprehensive metabolic panel   No follow-ups on file.      The entirety of the information documented in the History of Present Illness, Review of Systems and Physical Exam were personally obtained by me. Portions of this information were initially documented by the CMA and reviewed by me for thoroughness and accuracy.      Lelon Huh, MD  [facility] [phone] (phone) [phone] (fax)  [place]

## 2021-03-25 NOTE — Patient Instructions (Addendum)
.   Go to the [facility] on [facility] for right knee Xray  . Please go to the lab draw station in [address] on the second floor of [facility]  when you are fasting for 8 hours. Normal hours are 8:00am to 11:30am and 1:00pm to 4:00pm Monday through Friday

## 2021-03-26 ENCOUNTER — Other Ambulatory Visit: Payer: Self-pay

## 2021-03-26 ENCOUNTER — Ambulatory Visit
Admission: RE | Admit: 2021-03-26 | Discharge: 2021-03-26 | Disposition: A | Discharge status: Home / Self Care | Payer: Medicare Other | Source: Ambulatory Visit | Attending: Family Medicine | Admitting: Family Medicine

## 2021-03-26 ENCOUNTER — Ambulatory Visit
Admission: RE | Admit: 2021-03-26 | Discharge: 2021-03-26 | Disposition: A | Discharge status: Home / Self Care | Payer: Medicare Other | Attending: Family Medicine | Admitting: Family Medicine

## 2021-03-26 DIAGNOSIS — M25561 Pain in right knee: Secondary | ICD-10-CM | POA: Diagnosis not present

## 2021-03-27 ENCOUNTER — Encounter: Payer: Self-pay | Admitting: Family Medicine

## 2021-03-27 DIAGNOSIS — M1711 Unilateral primary osteoarthritis, right knee: Secondary | ICD-10-CM | POA: Insufficient documentation

## 2021-03-27 LAB — CBC
Hematocrit: 44.2 % (ref 37.5–51.0)
Hemoglobin: 14.3 g/dL (ref 13.0–17.7)
MCH: 21.5 pg — ABNORMAL LOW (ref 26.6–33.0)
MCHC: 32.4 g/dL (ref 31.5–35.7)
MCV: 67 fL — ABNORMAL LOW (ref 79–97)
Platelets: 282 10*3/uL (ref 150–450)
RBC: 6.65 x10E6/uL — ABNORMAL HIGH (ref 4.14–5.80)
RDW: 17 % — ABNORMAL HIGH (ref 11.6–15.4)
WBC: 6.8 10*3/uL (ref 3.4–10.8)

## 2021-03-27 LAB — COMPREHENSIVE METABOLIC PANEL
ALT: 7 IU/L (ref 0–44)
AST: 15 IU/L (ref 0–40)
Albumin/Globulin Ratio: 1.5 (ref 1.2–2.2)
Albumin: 4.4 g/dL (ref 3.7–4.7)
Alkaline Phosphatase: 85 IU/L (ref 44–121)
BUN/Creatinine Ratio: 8 — ABNORMAL LOW (ref 10–24)
BUN: 12 mg/dL (ref 8–27)
Bilirubin Total: 0.6 mg/dL (ref 0.0–1.2)
CO2: 25 mmol/L (ref 20–29)
Calcium: 9.7 mg/dL (ref 8.6–10.2)
Chloride: 102 mmol/L (ref 96–106)
Creatinine, Ser: 1.52 mg/dL — ABNORMAL HIGH (ref 0.76–1.27)
Globulin, Total: 2.9 g/dL (ref 1.5–4.5)
Glucose: 99 mg/dL (ref 65–99)
Potassium: 3.5 mmol/L (ref 3.5–5.2)
Sodium: 143 mmol/L (ref 134–144)
Total Protein: 7.3 g/dL (ref 6.0–8.5)
eGFR: 48 mL/min/{1.73_m2} — ABNORMAL LOW (ref >59)

## 2021-03-27 LAB — LIPID PANEL
Chol/HDL Ratio: 5.3 ratio — ABNORMAL HIGH (ref 0.0–5.0)
Cholesterol, Total: 221 mg/dL — ABNORMAL HIGH (ref 100–199)
HDL: 42 mg/dL (ref >39)
LDL Chol Calc (NIH): 144 mg/dL — ABNORMAL HIGH (ref 0–99)
Triglycerides: 195 mg/dL — ABNORMAL HIGH (ref 0–149)
VLDL Cholesterol Cal: 35 mg/dL (ref 5–40)

## 2021-03-27 LAB — HEMOGLOBIN A1C
Est. average glucose Bld gHb Est-mCnc: 131 mg/dL
Hgb A1c MFr Bld: 6.2 % — ABNORMAL HIGH (ref 4.8–5.6)

## 2021-03-27 LAB — VITAMIN D 25 HYDROXY (VIT D DEFICIENCY, FRACTURES): Vit D, 25-Hydroxy: 25.6 ng/mL — ABNORMAL LOW (ref 30.0–100.0)

## 2021-03-27 LAB — PSA TOTAL (REFLEX TO FREE): Prostate Specific Ag, Serum: 0.4 ng/mL (ref 0.0–4.0)

## 2021-04-18 ENCOUNTER — Other Ambulatory Visit: Payer: Self-pay | Admitting: Family Medicine

## 2021-04-18 DIAGNOSIS — F5101 Primary insomnia: Secondary | ICD-10-CM

## 2021-04-18 NOTE — Telephone Encounter (Signed)
Requested Prescriptions  Pending Prescriptions Disp Refills  . traZODone (DESYREL) 100 MG tablet [Pharmacy Med Name: TRAZODONE 100 MG TABLET] 45 tablet 0    Sig: TAKE 0.5-1 TABLETS (50-100 MG TOTAL) BY MOUTH AT BEDTIME AS NEEDED FOR SLEEP.     Psychiatry: Antidepressants - Serotonin Modulator Passed - 04/18/2021 10:30 AM      Passed - Valid encounter within last 6 months    Recent Outpatient Visits          3 weeks ago Right knee pain, unspecified chronicity   [facility] Birdie Sons, MD   6 months ago Urinary body odor   [facility] Carles Collet M, [place]   8 months ago Essential hypertension   [facility] Birdie Sons, MD   1 year ago Dementia without behavioral disturbance, unspecified dementia type [facility])   [facility] Birdie Sons, MD   1 year ago [place], Kirstie Peri, MD

## 2021-04-19 ENCOUNTER — Other Ambulatory Visit: Payer: Self-pay | Admitting: Family Medicine

## 2021-04-19 NOTE — Telephone Encounter (Signed)
Requested medications are due for refill today yes  Requested medications are on the active medication list yes  Last refill 1//15  Last visit 03/25/21  Future visit scheduled no  Notes to clinic Historical Provider, also states has been discontinued.

## 2021-05-07 IMAGING — CT CT ABD-PELV W/ CM
2 of 5 series · 15 of 46 positions shown, 17 images · IV contrast (APPLIED)
Comparison: September 29, 2019

CLINICAL DATA: Abdominal pain.

EXAM:
CT ABDOMEN AND PELVIS WITH CONTRAST
TECHNIQUE: Multidetector CT imaging of the abdomen and pelvis was performed
using the standard protocol following bolus administration of
intravenous contrast.
CONTRAST:  125mL OMNIPAQUE IOHEXOL 300 MG/ML  SOLN

[Series 2: routine abd/pel with · axial · 0.82mm/px · z∈[-1168,-718]mm · 12 of 102 slices shown, 14 images]
[im 6/102  soft-tissue]
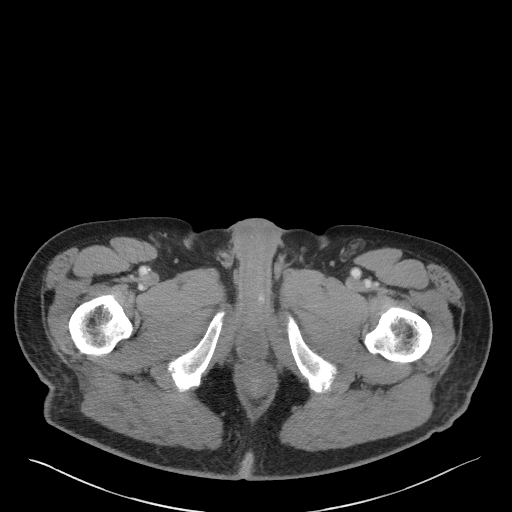
[im 6/102  bone]
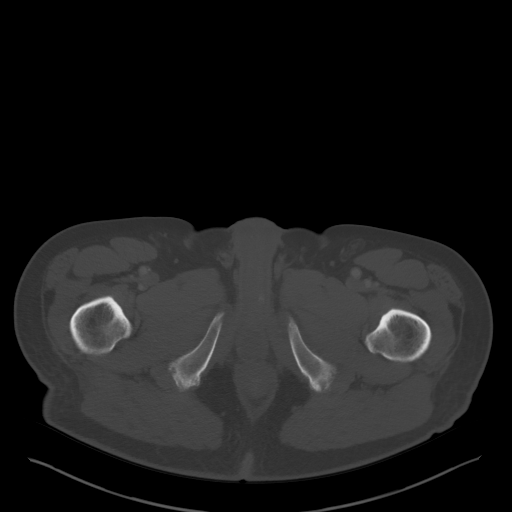
[im 16/102  soft-tissue]
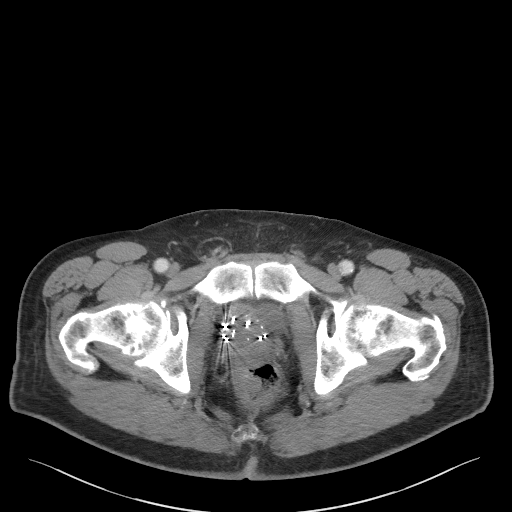
[im 21/102  soft-tissue]
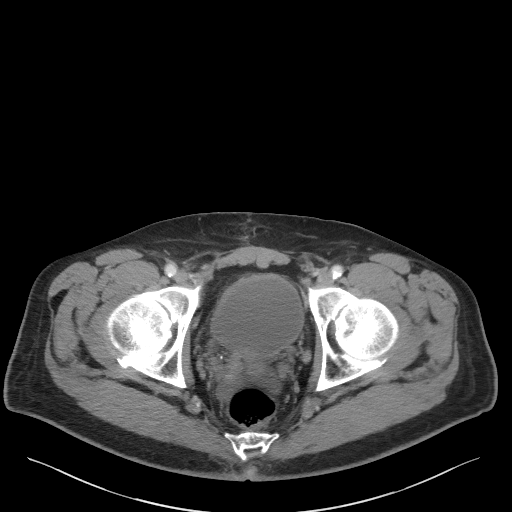
[im 31/102  soft-tissue]
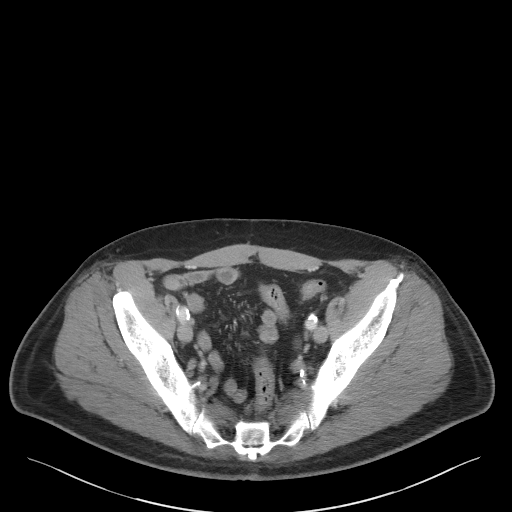
[im 41/102  soft-tissue]
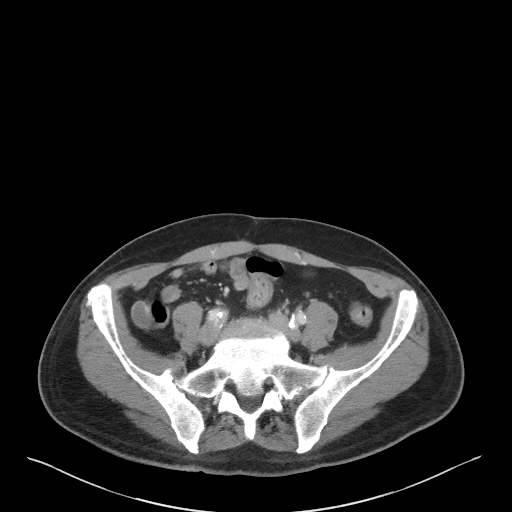
[im 46/102  soft-tissue]
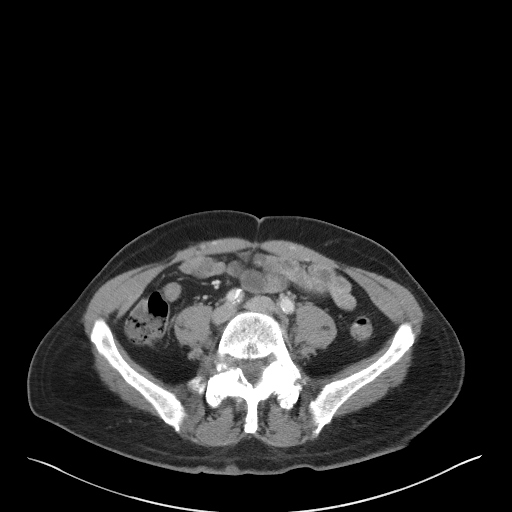
[im 56/102  soft-tissue]
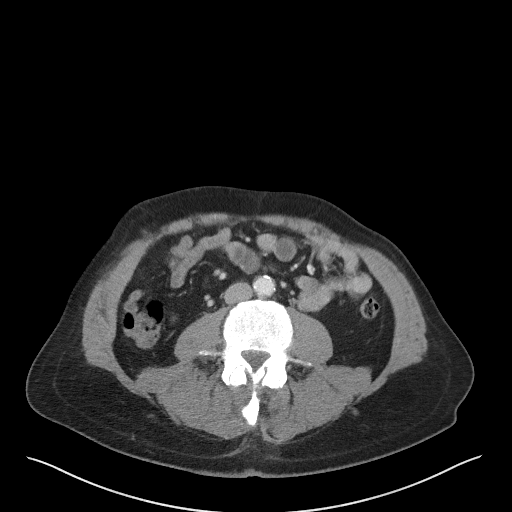
[im 61/102  soft-tissue]
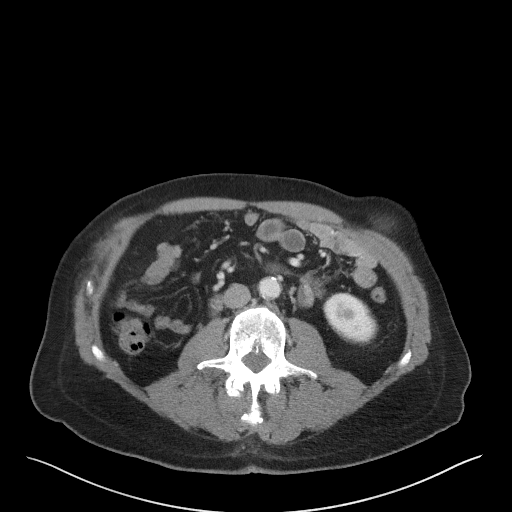
[im 71/102  soft-tissue]
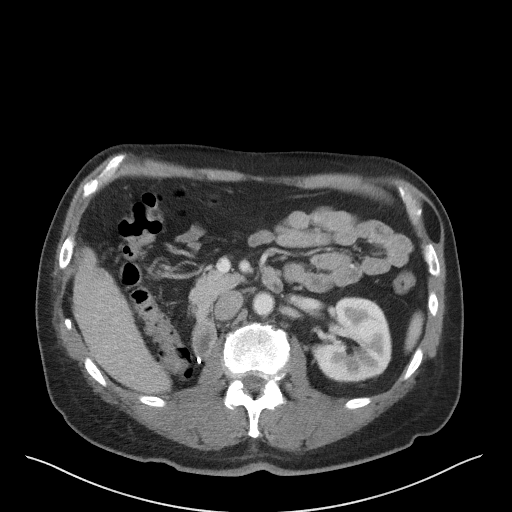
[im 71/102  bone]
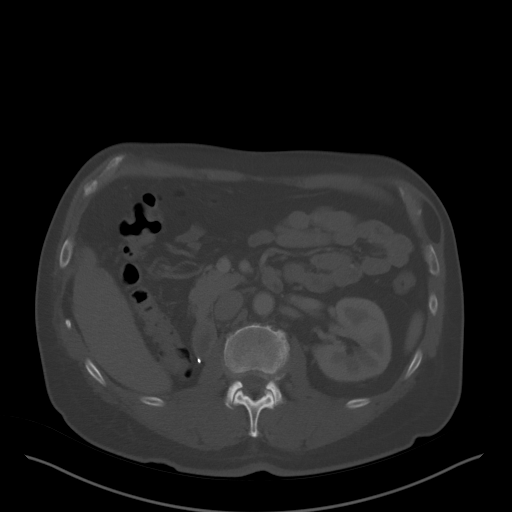
[im 81/102  soft-tissue]
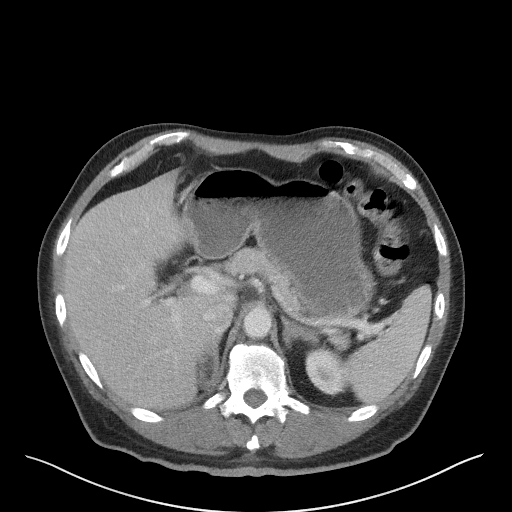
[im 86/102  soft-tissue]
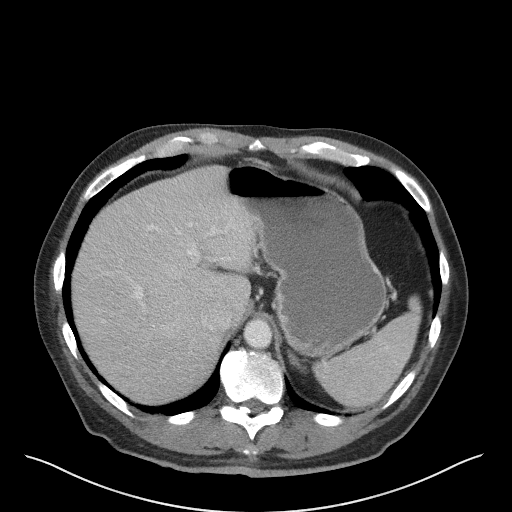
[im 96/102  soft-tissue]
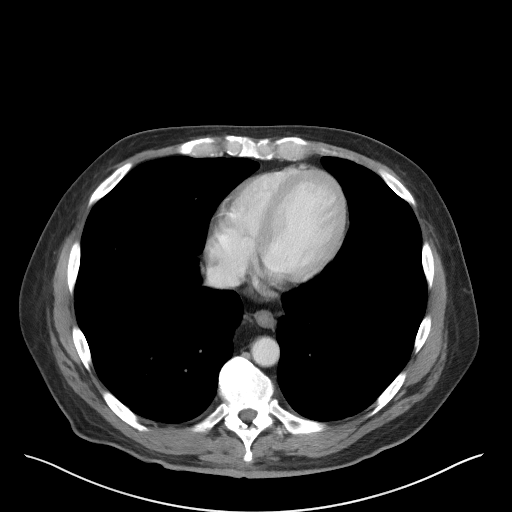

[Series 5: coronal st · coronal · 0.73mm/px · 3 of 96 slices shown]
[im 32/96  soft-tissue]
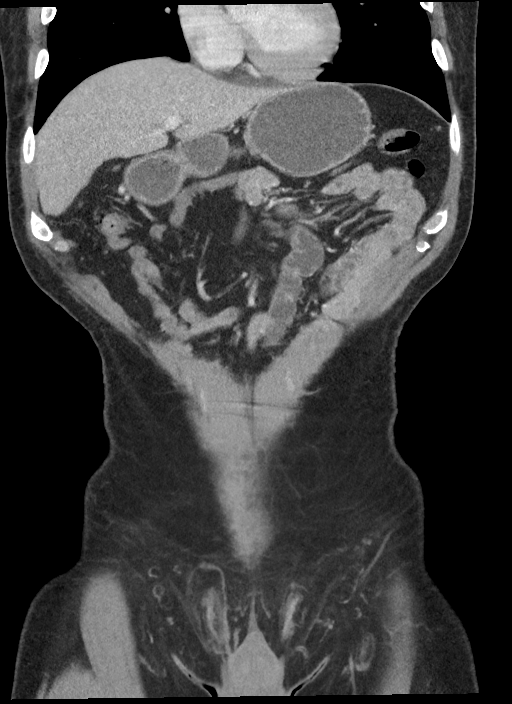
[im 43/96  soft-tissue]
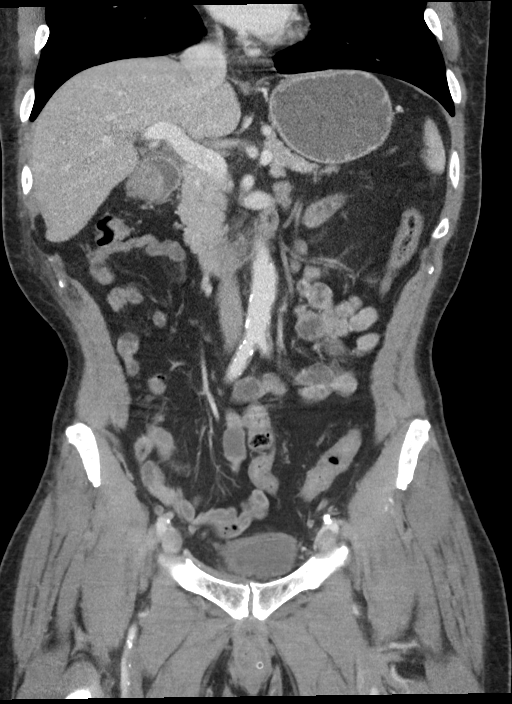
[im 53/96  soft-tissue]
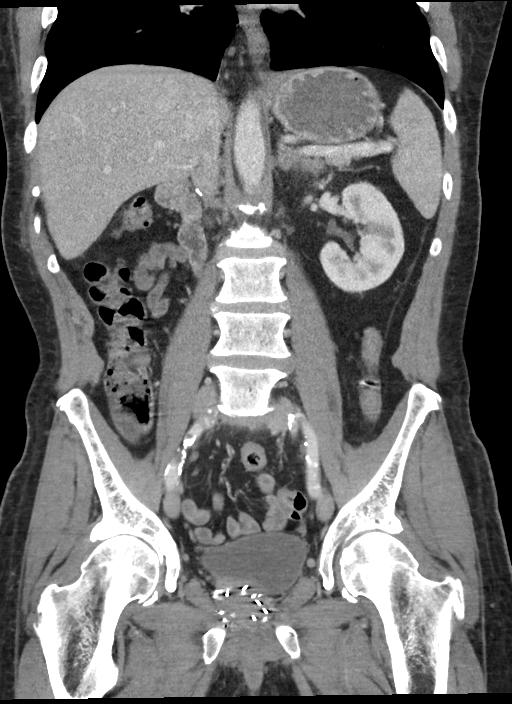

[15 of 46 positions shown; findings below may reference images not displayed]

FINDINGS: Lower chest: No acute abnormality.

Hepatobiliary: No focal liver abnormality is seen. Status post
cholecystectomy. No biliary dilatation.

Pancreas: Unremarkable. No pancreatic ductal dilatation or
surrounding inflammatory changes.

Spleen: Normal in size without focal abnormality.

Adrenals/Urinary Tract: A stable 1.2 cm x 1.5 cm isodense right
adrenal mass is seen. A stable 1.4 cm x 1.2 cm low-attenuation left
adrenal mass is also noted. The right kidney is surgically absent.
The left kidney is normal in size, without renal calculi, focal
lesion, or hydronephrosis. Bladder is unremarkable.

Stomach/Bowel: A very small hiatal hernia is seen which is decreased
in size when compared to the prior exam. Appendix appears normal. No
evidence of bowel wall thickening, distention, or inflammatory
changes.

Vascular/Lymphatic: Moderate to marked severity aortic
atherosclerosis. No enlarged abdominal or pelvic lymph nodes.

Reproductive: Numerous prostate radiation implantation seeds are
seen.

Other: A stable 6.9 cm x 3.3 cm lipoma is seen along the
anterolateral aspect of the mid left chest wall. A stable 2.5 cm x
1.3 cm intramuscular lipoma is seen along the posterolateral aspect
of the lower right chest wall.

There is a stable 2.9 cm x 1.7 cm fat containing right inguinal
hernia.

A stable trace amount of posterior pelvic free fluid is seen.

Musculoskeletal: Multilevel degenerative changes seen throughout the
lumbar spine.
IMPRESSION: 1. Evidence of prior right nephrectomy.
2. Stable bilateral adrenal nodules, likely representing benign
adenomas.
3. Stable fat containing right inguinal hernia.
4. Prostate radiation implantation seeds.

Aortic Atherosclerosis ([id number]).

## 2021-05-25 ENCOUNTER — Other Ambulatory Visit: Payer: Self-pay | Admitting: Family Medicine

## 2021-05-25 DIAGNOSIS — I10 Essential (primary) hypertension: Secondary | ICD-10-CM

## 2021-05-25 DIAGNOSIS — F410 Panic disorder [episodic paroxysmal anxiety] without agoraphobia: Secondary | ICD-10-CM

## 2021-05-25 NOTE — Telephone Encounter (Signed)
Pt sent message to make appt to f/u on sertraline. Pt is due appt. Pt given 30 day courtesy RF.  MyChart message sent to pt with call back number. Last RF 05/04/20 #90 w/3 RF.

## 2021-06-08 ENCOUNTER — Other Ambulatory Visit: Payer: Self-pay | Admitting: Family Medicine

## 2021-06-08 DIAGNOSIS — F5101 Primary insomnia: Secondary | ICD-10-CM

## 2021-06-08 NOTE — Telephone Encounter (Signed)
90 day refill approved

## 2021-06-19 ENCOUNTER — Other Ambulatory Visit: Payer: Self-pay | Admitting: Family Medicine

## 2021-06-19 DIAGNOSIS — F410 Panic disorder [episodic paroxysmal anxiety] without agoraphobia: Secondary | ICD-10-CM

## 2021-07-11 ENCOUNTER — Other Ambulatory Visit: Payer: Self-pay | Admitting: Family Medicine

## 2021-07-11 DIAGNOSIS — F410 Panic disorder [episodic paroxysmal anxiety] without agoraphobia: Secondary | ICD-10-CM

## 2021-07-11 NOTE — Telephone Encounter (Signed)
  Notes to clinic:  REQUEST FOR 90 DAYS PRESCRIPTION. DX Code Needed   Requested Prescriptions  Pending Prescriptions Disp Refills   sertraline (ZOLOFT) 100 MG tablet [Pharmacy Med Name: SERTRALINE HCL 100 MG TABLET] 90 tablet 1    Sig: TAKE 1 TABLET BY MOUTH EVERY DAY AS NEEDED      Psychiatry:  Antidepressants - SSRI Passed - 07/11/2021  2:31 PM      Passed - Valid encounter within last 6 months    Recent Outpatient Visits           3 months ago Right knee pain, unspecified chronicity   [facility] Birdie Sons, MD   8 months ago Urinary body odor   [facility] Carles Collet M, [place]   11 months ago Essential hypertension   [facility] Birdie Sons, MD   1 year ago Dementia without behavioral disturbance, unspecified dementia type [facility])   [facility] Birdie Sons, MD   1 year ago [place], Kirstie Peri, MD

## 2021-07-22 ENCOUNTER — Encounter: Payer: Self-pay | Admitting: Family Medicine

## 2021-07-22 ENCOUNTER — Other Ambulatory Visit: Payer: Self-pay

## 2021-07-22 ENCOUNTER — Ambulatory Visit (INDEPENDENT_AMBULATORY_CARE_PROVIDER_SITE_OTHER): Payer: Medicare Other | Admitting: Family Medicine

## 2021-07-22 VITALS — BP 145/90 | HR 64 | Temp 98.8°F | Ht 72.0 in

## 2021-07-22 DIAGNOSIS — I10 Essential (primary) hypertension: Secondary | ICD-10-CM | POA: Diagnosis not present

## 2021-07-22 DIAGNOSIS — F0280 Dementia in other diseases classified elsewhere without behavioral disturbance: Secondary | ICD-10-CM

## 2021-07-22 DIAGNOSIS — G309 Alzheimer's disease, unspecified: Secondary | ICD-10-CM | POA: Diagnosis not present

## 2021-07-22 DIAGNOSIS — F0150 Vascular dementia without behavioral disturbance: Secondary | ICD-10-CM

## 2021-07-22 DIAGNOSIS — R609 Edema, unspecified: Secondary | ICD-10-CM | POA: Diagnosis not present

## 2021-07-22 NOTE — Progress Notes (Signed)
Established patient visit   Patient: Luke [name]   DOB: October 20, 1946   75 y.o. Male  MRN: [id number] Visit Date: 07/22/2021  Today's healthcare provider: Vernie Murders, PA-C   Chief Complaint  Patient presents with   Foot Swelling   Subjective    HPI  Swelling in foot started swelling Tuesday night. Thursday sleep a lot and not as alert. EMS came to check him out. Headaches around the same time feet began swelling. Given motrin; minor help. Swelling getting better but still there. Pressure in foot when bearing weight and does not want anyone touching them. Currently only taking BP meds, motrin and melatonin to sleep.  Patient Active Problem List   Diagnosis Date Noted   Osteoarthritis of right knee 03/27/2021   Mixed Alzheimer's and vascular dementia ([place]) 05/18/2020   Scrotal pain 09/01/2019   History of prostate cancer 09/01/2019   Personal history of colonic polyps    Polyp of transverse colon    Problems with swallowing and mastication    Abnormal barium swallow    Stricture and stenosis of esophagus    Hydrocele in adult 04/19/2019   Hearing difficulty 05/12/2017   Dementia ([place]) 10/16/2016   Inguinal pain 05/12/2016   Erectile dysfunction after prostate brachytherapy 02/12/2016   Solitary kidney 02/12/2016   Prediabetes 02/08/2016   GERD (gastroesophageal reflux disease) 02/06/2016   Headache 02/06/2016   Hypogonadism male 02/06/2016   Insomnia 02/06/2016   Numbness of toes 02/06/2016   Palpitations 02/06/2016   Congenital renal agenesis and dysgenesis 02/06/2016   Vitamin D deficiency 02/06/2016   LBP (low back pain) 07/13/2013   Dysphagia 04/02/2009   Hypertension 04/02/2009   Brachial neuritis 04/02/2009   History of MRSA infection 11/18/2007   Hyperlipidemia, mixed 12/29/1998   Acquired absence of kidney 12/29/1988   Past Medical History:  Diagnosis Date   Acquired absence of kidney 12/29/1988   Brachial neuritis 04/02/2009   Claudication ([place])  02/06/2016   Congenital renal agenesis and dysgenesis 02/06/2016   Dysphagia 04/02/2009   GERD (gastroesophageal reflux disease) 02/06/2016   Schatski ring seen on EGD 07-10-11    History of MRSA infection 11/18/2007   Hyperlipidemia, mixed 12/29/1998   Hypertension 04/02/2009   Hypogonadism male 02/06/2016   Insomnia 02/06/2016   LBP (low back pain) 07/13/2013   Numbness of toes 02/06/2016   Palpitations 02/06/2016   Prediabetes 02/08/2016   A1c=6.2 02/08/16    Prostate cancer ([place]) 2014   Seed Implants   Single kidney    Vitamin D deficiency 02/06/2016   Allergies  Allergen Reactions   Donepezil     Hallucinations   Medications: Outpatient Medications Prior to Visit  Medication Sig   amLODipine (NORVASC) 5 MG tablet TAKE 1 TABLET BY MOUTH EVERY DAY   Cholecalciferol (VITAMIN D) 2000 units CAPS Take 1 capsule by mouth daily. occasionally   gabapentin (NEURONTIN) 300 MG capsule TAKE 1 CAPSULE BY MOUTH TWICE A DAY   hydrochlorothiazide (HYDRODIURIL) 12.5 MG tablet TAKE 1 TABLET BY MOUTH EVERY DAY   memantine (NAMENDA) 5 MG tablet Take 5 mg by mouth daily.    sertraline (ZOLOFT) 100 MG tablet Take 1 tablet (100 mg total) by mouth daily.   tamsulosin (FLOMAX) 0.4 MG CAPS capsule Take 1 capsule (0.4 mg total) by mouth daily.   [DISCONTINUED] hydrochlorothiazide (MICROZIDE) 12.5 MG capsule Take 12.5 mg by mouth daily.   [DISCONTINUED] rivastigmine (EXELON) 6 MG capsule TAKE 1 CAPSULE BY MOUTH TWICE A DAY FOR  14 DAYS THEN INCREASE TO 2 CAPSULES BY MOUTH DAILY   [DISCONTINUED] traZODone (DESYREL) 100 MG tablet Take 0.5-1 tablets (50-100 mg total) by mouth at bedtime as needed for sleep. /g47.0   No facility-administered medications prior to visit.    Review of Systems  Constitutional: Negative.   HENT: Negative.    Respiratory: Negative.    Cardiovascular: Negative.   Musculoskeletal:        Some swelling of feet and discomfort.  Psychiatric/Behavioral:  Positive for confusion.        Objective     BP (!) 145/90   Pulse 64   Temp 98.8 F (37.1 C) (Oral)   Ht 6' (1.829 m)   SpO2 97%   BMI 31.65 kg/m  BP Readings from Last 3 Encounters:  07/22/21 (!) 145/90  03/25/21 (!) 142/82  10/15/20 (!) 142/111   Wt Readings from Last 3 Encounters: 03/25/21 233 lb 6.4 oz (105.9 kg) 10/15/20 219 lb (99.3 kg) 08/03/20 216 lb 9.6 oz (98.2 kg)    Physical Exam Constitutional:      General: He is not in acute distress.    Appearance: He is well-developed.  HENT:     Head: Normocephalic and atraumatic.     Right Ear: Hearing and tympanic membrane normal.     Left Ear: Hearing and tympanic membrane normal.     Nose: Nose normal.  Eyes:     General: Lids are normal. No scleral icterus.       Right eye: No discharge.        Left eye: No discharge.     Conjunctiva/sclera: Conjunctivae normal.  Cardiovascular:     Rate and Rhythm: Normal rate and regular rhythm.     Heart sounds: Normal heart sounds.  Pulmonary:     Effort: Pulmonary effort is normal. No respiratory distress.     Breath sounds: Normal breath sounds.  Abdominal:     General: Bowel sounds are normal.     Palpations: Abdomen is soft.  Musculoskeletal:        General: Swelling present.     Cervical back: Normal range of motion and neck supple.     Comments: Stiffness of knees and swelling of each forefoot. No signs of open sores or ingrown nails. Questionable discomfort to palpate bottom of each foot. Good pulses. No swelling of lower legs or ankles.  Skin:    Findings: No lesion or rash.  Neurological:     Mental Status: He is alert and oriented to person, place, and time.     Motor: Weakness present.  Psychiatric:        Attention and Perception: He is inattentive.        Speech: Speech normal.        Behavior: Behavior normal.        Thought Content: Thought content normal.        Cognition and Memory: Cognition is impaired. Memory is impaired.      No results found for any visits on 07/22/21.   Assessment & Plan     1. Peripheral edema Some edema of feet with good pulses and no wounds. Some of it may be associated with Amlodipine. Will check labs and may need to increase HCTZ or give low dose of Furosemide pending lab reports. - CBC with Differential/Platelet - Comprehensive metabolic panel  2. Mixed Alzheimer's and vascular dementia [facility]) Family recognizes worsening dementia but would prefer to take care of him at home. Recheck labs for infection  or metabolic imbalances. Schedule Home Health evaluation. - CBC with Differential/Platelet - Comprehensive metabolic panel - Ambulatory referral to Home Health  3. Essential hypertension Borderline BP on Amlodipine 5 mg qd and HCTZ 12.5 mg qd. Will recheck labs. May need extra diuretic. - CBC with Differential/Platelet - Comprehensive metabolic panel   No follow-ups on file.      I, Chetara Kropp, PA-C, have reviewed all documentation for this visit. The documentation on 07/22/21 for the exam, diagnosis, procedures, and orders are all accurate and complete.    Vernie Murders, PA-C  [employer] [phone] (phone) [phone] (fax)  [place]

## 2021-07-23 ENCOUNTER — Telehealth: Payer: Self-pay

## 2021-07-23 LAB — COMPREHENSIVE METABOLIC PANEL
ALT: 93 IU/L — ABNORMAL HIGH (ref 0–44)
AST: 83 IU/L — ABNORMAL HIGH (ref 0–40)
Albumin/Globulin Ratio: 1.6 (ref 1.2–2.2)
Albumin: 4.6 g/dL (ref 3.7–4.7)
Alkaline Phosphatase: 90 IU/L (ref 44–121)
BUN/Creatinine Ratio: 14 (ref 10–24)
BUN: 20 mg/dL (ref 8–27)
Bilirubin Total: 1.2 mg/dL (ref 0.0–1.2)
CO2: 25 mmol/L (ref 20–29)
Calcium: 9.9 mg/dL (ref 8.6–10.2)
Chloride: 100 mmol/L (ref 96–106)
Creatinine, Ser: 1.39 mg/dL — ABNORMAL HIGH (ref 0.76–1.27)
Globulin, Total: 2.9 g/dL (ref 1.5–4.5)
Glucose: 106 mg/dL — ABNORMAL HIGH (ref 65–99)
Potassium: 3.2 mmol/L — ABNORMAL LOW (ref 3.5–5.2)
Sodium: 142 mmol/L (ref 134–144)
Total Protein: 7.5 g/dL (ref 6.0–8.5)
eGFR: 53 mL/min/{1.73_m2} — ABNORMAL LOW (ref >59)

## 2021-07-23 LAB — CBC WITH DIFFERENTIAL/PLATELET
Basophils Absolute: 0 10*3/uL (ref 0.0–0.2)
Basos: 0 %
EOS (ABSOLUTE): 0 10*3/uL (ref 0.0–0.4)
Eos: 0 %
Hematocrit: 43.2 % (ref 37.5–51.0)
Hemoglobin: 13.8 g/dL (ref 13.0–17.7)
Immature Grans (Abs): 0 10*3/uL (ref 0.0–0.1)
Immature Granulocytes: 0 %
Lymphocytes Absolute: 1.6 10*3/uL (ref 0.7–3.1)
Lymphs: 17 %
MCH: 21.3 pg — ABNORMAL LOW (ref 26.6–33.0)
MCHC: 31.9 g/dL (ref 31.5–35.7)
MCV: 67 fL — ABNORMAL LOW (ref 79–97)
Monocytes Absolute: 0.7 10*3/uL (ref 0.1–0.9)
Monocytes: 7 %
Neutrophils Absolute: 7.1 10*3/uL — ABNORMAL HIGH (ref 1.4–7.0)
Neutrophils: 76 %
Platelets: 407 10*3/uL (ref 150–450)
RBC: 6.49 x10E6/uL — ABNORMAL HIGH (ref 4.14–5.80)
RDW: 15.6 % — ABNORMAL HIGH (ref 11.6–15.4)
WBC: 9.5 10*3/uL (ref 3.4–10.8)

## 2021-07-23 NOTE — Telephone Encounter (Signed)
Copied from [place] [id number]. Topic: General - Call Back - No Documentation >> Jul 23, 2021 11:39 AM Lennox Solders wrote: Reason for CRM: Pt wife Luke [name] Pulse is calling and would like her husband blood work results

## 2021-07-26 ENCOUNTER — Ambulatory Visit: Payer: Self-pay

## 2021-07-26 NOTE — Telephone Encounter (Signed)
Wife reports pt. Is no better. Feet are still swollen, weak, poor appetite. Wife and daughter are having to assist pt. To bathroom.Would like lab results. Also have not heard from home health. Wife reports "we really need help taking care of him." Please advise.   Answer Assessment - Initial Assessment Questions 1. ONSET: "When did the swelling start?" (e.g., minutes, hours, days)     Days 2. LOCATION: "What part of the leg is swollen?"  "Are both legs swollen or just one leg?"     Both feet 3. SEVERITY: "How bad is the swelling?" (e.g., localized; mild, moderate, severe)  - Localized - small area of swelling localized to one leg  - MILD pedal edema - swelling limited to foot and ankle, pitting edema < 1/4 inch (6 mm) deep, rest and elevation eliminate most or all swelling  - MODERATE edema - swelling of lower leg to knee, pitting edema > 1/4 inch (6 mm) deep, rest and elevation only partially reduce swelling  - SEVERE edema - swelling extends above knee, facial or hand swelling present      Mild 4. REDNESS: "Does the swelling look red or infected?"     No 5. PAIN: "Is the swelling painful to touch?" If Yes, ask: "How painful is it?"   (Scale 1-10; mild, moderate or severe)     No 6. FEVER: "Do you have a fever?" If Yes, ask: "What is it, how was it measured, and when did it start?"      No 7. CAUSE: "What do you think is causing the leg swelling?"     Unsure 8. MEDICAL HISTORY: "Do you have a history of heart failure, kidney disease, liver failure, or cancer?"     Yes 9. RECURRENT SYMPTOM: "Have you had leg swelling before?" If Yes, ask: "When was the last time?" "What happened that time?"     No 10. OTHER SYMPTOMS: "Do you have any other symptoms?" (e.g., chest pain, difficulty breathing)       Weakness, poor appetite 11. PREGNANCY: "Is there any chance you are pregnant?" "When was your last menstrual period?"       N/a  Protocols used: Leg Swelling and Edema-A-AH

## 2021-07-26 NOTE — Telephone Encounter (Signed)
Wife would like someone to call her and give her his lab results

## 2021-07-26 NOTE — Telephone Encounter (Signed)
Home heath order still pending. Please advise as below. Any recommendations?

## 2021-07-28 NOTE — Telephone Encounter (Signed)
Placed order on 07-22-21. Check with scheduling to see if they need anything else to proceed with Home Health Evaluation order.

## 2021-07-29 ENCOUNTER — Encounter: Payer: Self-pay | Admitting: Family Medicine

## 2021-07-29 ENCOUNTER — Ambulatory Visit (INDEPENDENT_AMBULATORY_CARE_PROVIDER_SITE_OTHER): Payer: Medicare Other | Admitting: Family Medicine

## 2021-07-29 ENCOUNTER — Other Ambulatory Visit: Payer: Self-pay

## 2021-07-29 ENCOUNTER — Ambulatory Visit: Payer: Self-pay

## 2021-07-29 VITALS — BP 137/88 | HR 58 | Temp 96.8°F | Resp 16

## 2021-07-29 DIAGNOSIS — D649 Anemia, unspecified: Secondary | ICD-10-CM | POA: Diagnosis not present

## 2021-07-29 DIAGNOSIS — R7401 Elevation of levels of liver transaminase levels: Secondary | ICD-10-CM | POA: Diagnosis not present

## 2021-07-29 DIAGNOSIS — R600 Localized edema: Secondary | ICD-10-CM

## 2021-07-29 DIAGNOSIS — R410 Disorientation, unspecified: Secondary | ICD-10-CM

## 2021-07-29 DIAGNOSIS — E876 Hypokalemia: Secondary | ICD-10-CM | POA: Diagnosis not present

## 2021-07-29 DIAGNOSIS — R630 Anorexia: Secondary | ICD-10-CM

## 2021-07-29 DIAGNOSIS — R5383 Other fatigue: Secondary | ICD-10-CM

## 2021-07-29 DIAGNOSIS — U071 COVID-19: Secondary | ICD-10-CM

## 2021-07-29 MED ORDER — POTASSIUM CHLORIDE CRYS ER 20 MEQ PO TBCR: 20.0000 meq | EXTENDED_RELEASE_TABLET | Freq: Every day | ORAL | 3 refills | Status: DC

## 2021-07-29 MED ORDER — FUROSEMIDE 20 MG PO TABS: 20.0000 mg | ORAL_TABLET | Freq: Every day | ORAL | 3 refills | Status: DC

## 2021-07-29 NOTE — Progress Notes (Signed)
I,Luke [name],acting as a scribe for Luke Huh, MD.,have documented all relevant documentation on the behalf of Luke Huh, MD,as directed by  Luke Huh, MD while in the presence of Luke Huh, MD.   Established patient visit   Patient: Luke [name]   DOB: 01-12-1946   75 y.o. Male  MRN: [id number] Visit Date: 07/29/2021  Today's healthcare provider: Lelon Huh, MD   Chief Complaint  Patient presents with   Follow-up   Subjective    HPI  Follow up for Peripheral edema  The patient was last seen for this 1 weeks ago. Changes made at last visit include; seen by [facility] [place]. Simona [name] checked labs. Last CBC Lab Results  Component Value Date   WBC 9.5 07/22/2021   HGB 13.8 07/22/2021   HCT 43.2 07/22/2021   MCV 67 (L) 07/22/2021   MCH 21.3 (L) 07/22/2021   RDW 15.6 (H) 07/22/2021   PLT 407 [id number]    Last metabolic panel Lab Results  Component Value Date   GLUCOSE 106 (H) 07/22/2021   NA 142 07/22/2021   K 3.2 (L) 07/22/2021   CL 100 07/22/2021   CO2 25 07/22/2021   BUN 20 07/22/2021   CREATININE 1.39 (H) 07/22/2021   GFRNONAA >60 04/02/2020   GFRAA >60 04/02/2020   CALCIUM 9.9 07/22/2021   PHOS 2.9 02/07/2016   PROT 7.5 07/22/2021   ALBUMIN 4.6 07/22/2021   LABGLOB 2.9 07/22/2021   AGRATIO 1.6 07/22/2021   BILITOT 1.2 07/22/2021   ALKPHOS 90 07/22/2021   AST 83 (H) 07/22/2021   ALT 93 (H) 07/22/2021   ANIONGAP 9 04/02/2020     He reports good compliance with treatment. He feels that condition is Unchanged. He is not having side effects. none  Family reports that all his sx started after having a positive covid test on July 10th. He had had a family exposure and mild cough, but no other respiratory symptoms. However, shortly after testing positive he starting acting much more fatigued, feet started sleeping, he was more restless, and starting fidgeting with hands much more. Has no had any shortness of treath.   -----------------------------------------------------------------------------------------      Medications: Outpatient Medications Prior to Visit  Medication Sig   amLODipine (NORVASC) 5 MG tablet TAKE 1 TABLET BY MOUTH EVERY DAY   Cholecalciferol (VITAMIN D) 2000 units CAPS Take 1 capsule by mouth daily. occasionally   gabapentin (NEURONTIN) 300 MG capsule TAKE 1 CAPSULE BY MOUTH TWICE A DAY   hydrochlorothiazide (HYDRODIURIL) 12.5 MG tablet TAKE 1 TABLET BY MOUTH EVERY DAY   memantine (NAMENDA) 5 MG tablet Take 5 mg by mouth daily.    sertraline (ZOLOFT) 100 MG tablet Take 1 tablet (100 mg total) by mouth daily.   tamsulosin (FLOMAX) 0.4 MG CAPS capsule Take 1 capsule (0.4 mg total) by mouth daily.   No facility-administered medications prior to visit.         Objective    BP 137/88 (BP Location: Right Arm, Patient Position: Sitting, Cuff Size: Large)   Pulse (!) 58   Temp (!) 96.8 F (36 C) (Temporal)   Resp 16   SpO2 98%     Physical Exam   General: Appearance:    Obese male in no acute distress  Eyes:    PERRL, conjunctiva/corneas clear, EOM's intact       Lungs:     Clear to auscultation bilaterally, respirations unlabored  Heart:    Bradycardic. Normal rhythm. No murmurs, rubs,  or gallops.    MS:   All extremities are intact.  Moderate bilateral edema of feet extending only to distal ankles.   Neurologic:   Awake, alert, oriented x 1. Persistent chorea type movement of hands and fingers.          Assessment & Plan     1. COVID-19 (July 10th, 2022) was only tested due to exposure within family and mild cough which has resolved, but may have precipitated other symptoms and lab findings below.   2. Elevated transaminase level  - Comprehensive metabolic panel - Iron, TIBC and Ferritin Panel  Consider abdomina u/s if not back to baseline.   3. Anemia, unspecified type -  Iron, TIBC and Ferritin Panel  4. Hypokalemia  - potassium chloride SA (KLOR-CON)  20 MEQ tablet; Take 1 tablet (20 mEq total) by mouth daily.  Dispense: 30 tablet; Refill: 3  5. Localized edema  - furosemide (LASIX) 20 MG tablet; Take 1 tablet (20 mg total) by mouth daily.  Dispense: 30 tablet; Refill: 3  6. Confusion Chronic dementia but worse the last few week.   7. Other fatigue/ poor appetite.        The entirety of the information documented in the History of Present Illness, Review of Systems and Physical Exam were personally obtained by me. Portions of this information were initially documented by the CMA and reviewed by me for thoroughness and accuracy.     Luke Huh, MD  [facility] [phone] (phone) [phone] (fax)  [place]

## 2021-07-29 NOTE — Telephone Encounter (Signed)
Wife calling to report pt. Still having pain and swelling to left foot. Still has poor appetite. Trying to push fluids    Reason for Disposition  [1] Redness of the skin AND [2] no fever  Answer Assessment - Initial Assessment Questions 1. ONSET: "When did the pain start?"      Started 07/16/21 2. LOCATION: "Where is the pain located?"      Left foot 3. PAIN: "How bad is the pain?"    (Scale 1-10; or mild, moderate, severe)  - MILD (1-3): doesn't interfere with normal activities.   - MODERATE (4-7): interferes with normal activities (e.g., work or school) or awakens from sleep, limping.   - SEVERE (8-10): excruciating pain, unable to do any normal activities, unable to walk.      Moderate 4. WORK OR EXERCISE: "Has there been any recent work or exercise that involved this part of the body?"      No 5. CAUSE: "What do you think is causing the foot pain?"     Unsure 6. OTHER SYMPTOMS: "Do you have any other symptoms?" (e.g., leg pain, rash, fever, numbness)     Pain 7. PREGNANCY: "Is there any chance you are pregnant?" "When was your last menstrual period?"     N/a  Protocols used: Foot Pain-A-AH

## 2021-07-29 NOTE — Telephone Encounter (Signed)
Wife requesting to have pt. Seen today. Concerned because he is not eating as well. Appointment made.

## 2021-07-29 NOTE — Telephone Encounter (Signed)
Seen by Dr. Caryn Section today and additional treatment given for edema with follow up labs.

## 2021-07-29 NOTE — Telephone Encounter (Signed)
Patient seen by Dr. Caryn Section today for assessment in the office.

## 2021-07-29 NOTE — Telephone Encounter (Signed)
FYI

## 2021-07-30 ENCOUNTER — Other Ambulatory Visit (INDEPENDENT_AMBULATORY_CARE_PROVIDER_SITE_OTHER): Payer: Medicare Other

## 2021-07-30 DIAGNOSIS — E876 Hypokalemia: Secondary | ICD-10-CM

## 2021-07-30 LAB — COMPREHENSIVE METABOLIC PANEL
ALT: 41 IU/L (ref 0–44)
AST: 27 IU/L (ref 0–40)
Albumin/Globulin Ratio: 1.5 (ref 1.2–2.2)
Albumin: 4.6 g/dL (ref 3.7–4.7)
Alkaline Phosphatase: 95 IU/L (ref 44–121)
BUN/Creatinine Ratio: 12 (ref 10–24)
BUN: 19 mg/dL (ref 8–27)
Bilirubin Total: 0.7 mg/dL (ref 0.0–1.2)
CO2: 22 mmol/L (ref 20–29)
Calcium: 10 mg/dL (ref 8.6–10.2)
Chloride: 98 mmol/L (ref 96–106)
Creatinine, Ser: 1.55 mg/dL — ABNORMAL HIGH (ref 0.76–1.27)
Globulin, Total: 3.1 g/dL (ref 1.5–4.5)
Glucose: 120 mg/dL — ABNORMAL HIGH (ref 65–99)
Potassium: 3.6 mmol/L (ref 3.5–5.2)
Sodium: 136 mmol/L (ref 134–144)
Total Protein: 7.7 g/dL (ref 6.0–8.5)
eGFR: 47 mL/min/{1.73_m2} — ABNORMAL LOW (ref >59)

## 2021-07-30 LAB — POCT URINALYSIS DIPSTICK
Bilirubin, UA: NEGATIVE
Blood, UA: NEGATIVE
Glucose, UA: NEGATIVE
Ketones, UA: NEGATIVE
Leukocytes, UA: NEGATIVE
Nitrite, UA: NEGATIVE
Protein, UA: NEGATIVE
Spec Grav, UA: 1.015 (ref 1.010–1.025)
Urobilinogen, UA: 0.2 E.U./dL
pH, UA: 6 (ref 5.0–8.0)

## 2021-07-30 LAB — IRON,TIBC AND FERRITIN PANEL
Ferritin: 989 ng/mL — ABNORMAL HIGH (ref 30–400)
Iron Saturation: 18 % (ref 15–55)
Iron: 47 ug/dL (ref 38–169)
Total Iron Binding Capacity: 264 ug/dL (ref 250–450)
UIBC: 217 ug/dL (ref 111–343)

## 2021-07-30 NOTE — Progress Notes (Signed)
Patient brought in urine specimen. Unable to void on yesterday's visit.

## 2021-07-31 ENCOUNTER — Telehealth: Payer: Self-pay | Admitting: Family Medicine

## 2021-07-31 ENCOUNTER — Telehealth: Payer: Self-pay | Admitting: *Deleted

## 2021-07-31 NOTE — Telephone Encounter (Signed)
Dr. Caryn Section ordered for home health referral on 06/2521. The patient has not received a call regarding a consultation visit.  Routing to office for follow-up [facility] referral. Patient's wife stated she is struggling working with the patient by herself.

## 2021-07-31 NOTE — Telephone Encounter (Signed)
Copied from [place] [id number]. Topic: General - Other >> Jul 31, 2021  9:26 AM Tessa Lerner A wrote: Reason for CRM: Patient's wife has returned missed call regarding lab results  Please contact further when possible

## 2021-07-31 NOTE — Telephone Encounter (Signed)
See result notes. 

## 2021-11-18 ENCOUNTER — Ambulatory Visit (INDEPENDENT_AMBULATORY_CARE_PROVIDER_SITE_OTHER): Payer: Medicare Other | Admitting: *Deleted

## 2021-11-18 DIAGNOSIS — R319 Hematuria, unspecified: Secondary | ICD-10-CM

## 2021-11-18 NOTE — Telephone Encounter (Signed)
Pt's wife calling, on [place]. Reports noted small "Pencil tip size of red blood in depends today, also noted pinkish spot, "Little bigger than red spot." Reports has not noted any in commode when pt voids, urine is not cloudy. Afebrile, checked during call. States pt did verbalizes some discomfort lower abdomen, bladder area. Reports pt stops stream when voiding "Like it might hurt him." No frequency. States she is certain from urine. After hours call. Care advise given. Assured NT would route to practice for PCPs review and final disposition, if able to bring specimen in tomorrow.   Please advise: [phone]     Reason for Disposition  Blood in urine  (Exception: could be normal menstrual bleeding)  Answer Assessment - Initial Assessment Questions 1. COLOR of URINE: "Describe the color of the urine."  (e.g., tea-colored, pink, red, blood clots, bloody)     Red spot size of pencil tip, pinkish little latger 2. ONSET: "When did the bleeding start?"      Today 3. EPISODES: "How many times has there been blood in the urine?" or "How many times today?"     One time 4. PAIN with URINATION: "Is there any pain with passing your urine?" If Yes, ask: "How bad is the pain?"  (Scale 1-10; or mild, moderate, severe)    - MILD - complains slightly about urination hurting    - MODERATE - interferes with normal activities      - SEVERE - excruciating, unwilling or unable to urinate because of the pain      Seems to have some discomfort 5. FEVER: "Do you have a fever?" If Yes, ask: "What is your temperature, how was it measured, and when did it start?"     no 6. ASSOCIATED SYMPTOMS: "Are you passing urine more frequently than usual?"     No 7. OTHER SYMPTOMS: "Do you have any other symptoms?" (e.g., back/flank pain, abdominal pain, vomiting)     Verbalized lower abdominal discomfort, bladder area.  Protocols used: Urine - Blood In-A-AH

## 2021-11-19 ENCOUNTER — Other Ambulatory Visit: Payer: Self-pay | Admitting: Family Medicine

## 2021-11-19 LAB — POCT URINALYSIS DIPSTICK
Glucose, UA: NEGATIVE
Ketones, UA: NEGATIVE
Leukocytes, UA: NEGATIVE
Nitrite, UA: NEGATIVE
Protein, UA: NEGATIVE
Spec Grav, UA: 1.02 (ref 1.010–1.025)
Urobilinogen, UA: 2 E.U./dL — AB
pH, UA: 7 (ref 5.0–8.0)

## 2021-11-19 NOTE — Addendum Note (Signed)
Addended by: Randal Buba on: 11/19/2021 04:50 PM   Modules accepted: Orders

## 2021-11-19 NOTE — Telephone Encounter (Addendum)
Patient's wife came by the office to drop off a urine sample. Per Dr. Caryn Section, ok to run a POCT urinalysis and send for culture.

## 2021-11-19 NOTE — Addendum Note (Signed)
Addended by: Randal Buba on: 11/19/2021 04:54 PM   Modules accepted: Orders

## 2021-11-24 LAB — URINE CULTURE

## 2021-12-09 ENCOUNTER — Other Ambulatory Visit: Payer: Self-pay | Admitting: Family Medicine

## 2021-12-09 DIAGNOSIS — I10 Essential (primary) hypertension: Secondary | ICD-10-CM

## 2021-12-09 MED ORDER — AMLODIPINE BESYLATE 5 MG PO TABS: 5.0000 mg | ORAL_TABLET | Freq: Every day | ORAL | 0 refills | Status: DC

## 2021-12-09 NOTE — Telephone Encounter (Signed)
Copied from [place] [id number]. Topic: Quick Communication - Rx Refill/Question >> Dec 09, 2021 12:40 PM Lenon Curt, [place] A wrote: Medication: amLODipine (NORVASC) 5 MG tablet [[id number]]   Has the patient contacted their pharmacy? Yes.  The patient's pharmacy has made contact on their behalf  (Agent: If no, request that the patient contact the pharmacy for the refill. If patient does not wish to contact the pharmacy document the reason why and proceed with request.) (Agent: If yes, when and what did the pharmacy advise?)  Preferred Pharmacy (with phone number or street name): [facility] [place] [address] [place] [address] Phone: [phone] Fax: [phone]   Has the patient been seen for an appointment in the last year OR does the patient have an upcoming appointment? Yes.    Agent: Please be advised that RX refills may take up to 3 business days. We ask that you follow-up with your pharmacy.

## 2021-12-09 NOTE — Telephone Encounter (Signed)
Requested Prescriptions  Pending Prescriptions Disp Refills  . amLODipine (NORVASC) 5 MG tablet 90 tablet 0    Sig: Take 1 tablet (5 mg total) by mouth daily.     Cardiovascular:  Calcium Channel Blockers Passed - 12/09/2021  1:27 PM      Passed - Last BP in normal range    BP Readings from Last 1 Encounters:  07/29/21 137/88         Passed - Valid encounter within last 6 months    Recent Outpatient Visits          4 months ago [place], Kirstie Peri, MD   4 months ago Peripheral edema   [place], PA-C   8 months ago Right knee pain, unspecified chronicity   [facility] Birdie Sons, MD   1 year ago Urinary body odor   [facility] Trinna Post, [place]   1 year ago Essential hypertension   [facility] Birdie Sons, MD

## 2021-12-10 ENCOUNTER — Telehealth: Payer: Self-pay | Admitting: Nurse Practitioner

## 2021-12-10 NOTE — Telephone Encounter (Signed)
Spoke with patient's wife [name], regarding the Palliative referral/services and all questions were answered and she was in agreement with scheduling visit.  I have scheduled an In-home Consult for 12/11/21 @ 12:30 PM

## 2021-12-11 ENCOUNTER — Other Ambulatory Visit: Payer: Medicare Other | Admitting: Nurse Practitioner

## 2021-12-11 ENCOUNTER — Other Ambulatory Visit: Payer: Self-pay

## 2021-12-11 ENCOUNTER — Encounter: Payer: Self-pay | Admitting: Nurse Practitioner

## 2021-12-11 DIAGNOSIS — Z515 Encounter for palliative care: Secondary | ICD-10-CM

## 2021-12-11 DIAGNOSIS — G309 Alzheimer's disease, unspecified: Secondary | ICD-10-CM

## 2021-12-11 DIAGNOSIS — F0150 Vascular dementia without behavioral disturbance: Secondary | ICD-10-CM

## 2021-12-11 NOTE — Progress Notes (Signed)
° ° °[facility] °Community Palliative Care Consult Note °Telephone: [phone]  °Fax: [phone]  ° °Date of encounter: 12/11/21 °8:29 PM °PATIENT NAME: Luke [name] °[address] °[place] [address]   °[phone] (home)  °DOB: 04/13/1946 °MRN: [id number] °PRIMARY CARE PROVIDER:    °Fisher, Donald E, MD,  °[address] °[place] [address] °[phone] ° °REFERRING PROVIDER:   °Fisher, Donald E, MD °[address] °[address] °[place],  [place] [address] °[phone] ° °RESPONSIBLE PARTY:    °Contact Information   ° ° Name Relation Home Work Mobile  ° Odell,Kathy D Spouse   [phone]  ° Crossland,Charlene Daughter   [phone]  ° Tall, Frances Daughter   [phone]  ° °  ° °I met face to face with patient and family in home. Palliative Care was asked to follow this patient by consultation request of  Fisher, Donald E, MD to address advance care planning and complex medical decision making. This is the initial visit.  °ASSESSMENT AND PLAN / RECOMMENDATIONS:  °Advance Care Planning/Goals of Care: Goals include to maximize quality of life and symptom management. Patient/health care surrogate gave his/her permission to discuss.Our advance care planning conversation included a discussion about:    °The value and importance of advance care planning  °Experiences with loved ones who have been seriously ill or have died  °Exploration of personal, cultural or spiritual beliefs that might influence medical decisions  °Exploration of goals of care in the event of a sudden injury or illness  °Identification and preparation of a healthcare agent  °Review and updating or creation of an  advance directive document . °Decision not to resuscitate or to de-escalate disease focused treatments due to poor prognosis. °CODE STATUS: Full code ° °Symptom Management/Plan: °1. Advance Care Planning; Full code; reviewed MOST form and given Hard Choice book for further discussion among  family, will re-visit at next visit ° °2. Dementia; discussed at length about chronic disease progression. We talked about decline with realistic expectations. We talked about resources and will have PC SW also consult for further support, long term planning with wishes are to try to keep Mr. Burtt at home rather than placement ° °3. Goals of Care: Goals include to maximize quality of life and symptom management. Our advance care planning conversation included a discussion about:    °The value and importance of advance care planning  °Exploration of personal, cultural or spiritual beliefs that might influence medical decisions  °Exploration of goals of care in the event of a sudden injury or illness  °Identification and preparation of a healthcare agent  °Review and updating or creation of an advance directive document. ° °4. Palliative care encounter; Palliative care encounter; Palliative medicine team will continue to support patient, patient's family, and medical team. Visit consisted of counseling and education dealing with the complex and emotionally intense issues of symptom management and palliative care in the setting of serious and potentially life-threatening illness ° °5. f/u 1 month for ongoing monitoring chronic disease progression, ongoing discussions complex medical decision making °Follow up Palliative Care Visit: Palliative care will continue to follow for complex medical decision making, advance care planning, and clarification of goals. Return 8 weeks or prn. ° °I spent 79 minutes providing this consultation. More than 50% of the time in this consultation was spent in counseling and care coordination. °PPS: 40% ° °HISTORY OF PRESENT ILLNESS:  Sir A Demir is a 75 y.o. year old male  with multiple medical problems   problems including Alzheimer's with vascular dementia, polyarthritis, sleep disruption. I called Mrs Dennington to confirm PC initial visit and covid screening negative. I visited Mr and Mrs  Membreno with daughter [name] Patient in their home. I talked with Mrs Shugart and [name] Patient prior to having Mr Zinda join visit. Mrs Casimer, Russett and I talked about the last time Mr Elsen was independent, we talked about life review. We talked about family dynamics. We talked about past medical history, dementia, current functional and cognitive abilities. We talked about the last Neurology visit with Dr Manuella Ghazi. We talked about realistic expectations with progression. We talked about cognitive impairment. Mr. Maffett is ambulatory but requires to be bathed, dressed. Mr. Triggs does feed himself though has lost some weight and deceased 1 clothes size. We talked about nutrition. We talked about Mr Tuckerman ability to communicate. Mrs Olafson endorses he does at times talks to the TV like they are present in the room. Mr. Haynesworth sometimes repeats the same words, speech has become more limited and more difficulty with processing. Mr. Boudoin joined the visit. Mr. Lunn sat in the chair at the kitchen table. Mr. Kernen make intermit eye contact. Mr. Musa did have difficulty with interactions, words, and staying on topic. Mr. Alen was able to answer only a few words at a time. Mr Bendickson was cooperative with assessment. Mrs Seeman endorses Mr. Heady used to watch TV and now he has difficulty focusing on a TV program. We talked about medical goals including MOST form, blank MOST form left. Hard Choice book also left. We talked about role pc in poc. We talked about resources and will send consult for PC SW. We talked about f/u pc visit with RN in 4 weeks, then [facility] NP in 8 weeks or sooner if decline. Mrs. Chappelle in agreement. Therapeutic listening, emotional support provided. Questions answered.   History obtained from review of EMR, discussion with Mrs and Mr. Schwenke with daughter [name] Patient I reviewed available labs, medications, imaging, studies and related documents from the EMR.  Records reviewed and summarized above.    ROS Full 10 system review of systems performed and negative with exception of: as per HPI.   Physical Exam: Constitutional: NAD General: pleasantly confused male with limited speech, few words at a time EYES: lids intact ENMT: oral mucous membranes moist CV: S1S2, RRR Pulmonary: LCTA, no increased work of breathing, no cough, room air Abdomen: normo-active BS + 4 quadrants, soft and non tender MSK: ambulatory Skin: warm and dry Neuro:  + generalized weakness,  + cognitive impairment Psych: A and Oriented to self  CURRENT PROBLEM LIST:  Patient Active Problem List   Diagnosis Date Noted   Osteoarthritis of right knee 03/27/2021   Mixed Alzheimer's and vascular dementia ([place]) 05/18/2020   Scrotal pain 09/01/2019   History of prostate cancer 09/01/2019   Personal history of colonic polyps    Polyp of transverse colon    Problems with swallowing and mastication    Abnormal barium swallow    Stricture and stenosis of esophagus    Hydrocele in adult 04/19/2019   Hearing difficulty 05/12/2017   Dementia ([place]) 10/16/2016   Inguinal pain 05/12/2016   Erectile dysfunction after prostate brachytherapy 02/12/2016   Solitary kidney 02/12/2016   Prediabetes 02/08/2016   GERD (gastroesophageal reflux disease) 02/06/2016   Headache 02/06/2016   Hypogonadism male 02/06/2016   Insomnia 02/06/2016   Numbness of toes 02/06/2016   Palpitations 02/06/2016   Congenital renal agenesis and dysgenesis 02/06/2016  Vitamin D deficiency 02/06/2016   LBP (low back pain) 07/13/2013   Dysphagia 04/02/2009   Hypertension 04/02/2009   Brachial neuritis 04/02/2009   History of MRSA infection 11/18/2007   Hyperlipidemia, mixed 12/29/1998   Acquired absence of kidney 12/29/1988   PAST MEDICAL HISTORY:  Active Ambulatory Problems    Diagnosis Date Noted   Dysphagia 04/02/2009   GERD (gastroesophageal reflux disease) 02/06/2016   Headache 02/06/2016   History of MRSA infection 11/18/2007    Hyperlipidemia, mixed 12/29/1998   Hypertension 04/02/2009   Hypogonadism male 02/06/2016   Insomnia 02/06/2016   LBP (low back pain) 07/13/2013   Brachial neuritis 04/02/2009   Numbness of toes 02/06/2016   Palpitations 02/06/2016   Acquired absence of kidney 12/29/1988   Congenital renal agenesis and dysgenesis 02/06/2016   Vitamin D deficiency 02/06/2016   Prediabetes 02/08/2016   Erectile dysfunction after prostate brachytherapy 02/12/2016   Solitary kidney 02/12/2016   Inguinal pain 05/12/2016   Dementia ([place]) 10/16/2016   Hearing difficulty 05/12/2017   Hydrocele in adult 04/19/2019   Personal history of colonic polyps    Polyp of transverse colon    Problems with swallowing and mastication    Abnormal barium swallow    Stricture and stenosis of esophagus    Scrotal pain 09/01/2019   History of prostate cancer 09/01/2019   Mixed Alzheimer's and vascular dementia ([place]) 05/18/2020   Osteoarthritis of right knee 03/27/2021   Resolved Ambulatory Problems    Diagnosis Date Noted   Claudication ([place]) 02/06/2016   Hand numbness 02/06/2016   Fecal occult blood test positive 02/06/2016   Prostate cancer ([place]) 02/01/2010   Past Medical History:  Diagnosis Date   Single kidney    SOCIAL HX:  Social History   Tobacco Use   Smoking status: Never   Smokeless tobacco: Never   Tobacco comments:    smokes a Lexicographer with friends  Substance Use Topics   Alcohol use: Yes    Alcohol/week: 0.0 standard drinks    Comment: occasional / monthly   FAMILY HX:  Family History  Problem Relation Age of Onset   Heart attack Mother    Hypertension Father    Prostate cancer Neg Hx    Bladder Cancer Neg Hx    Kidney cancer Neg Hx     reviewed  ALLERGIES:  Allergies  Allergen Reactions   Donepezil     Hallucinations     PERTINENT MEDICATIONS:  Outpatient Encounter Medications as of 12/11/2021  Medication Sig   amLODipine (NORVASC) 5 MG tablet Take 1 tablet (5  mg total) by mouth daily.   Cholecalciferol (VITAMIN D) 2000 units CAPS Take 1 capsule by mouth daily. occasionally   furosemide (LASIX) 20 MG tablet Take 1 tablet (20 mg total) by mouth daily.   gabapentin (NEURONTIN) 300 MG capsule TAKE 1 CAPSULE BY MOUTH TWICE A DAY   hydrochlorothiazide (HYDRODIURIL) 12.5 MG tablet TAKE 1 TABLET BY MOUTH EVERY DAY   memantine (NAMENDA) 5 MG tablet Take 5 mg by mouth daily.    potassium chloride SA (KLOR-CON) 20 MEQ tablet Take 1 tablet (20 mEq total) by mouth daily.   sertraline (ZOLOFT) 100 MG tablet Take 1 tablet (100 mg total) by mouth daily.   tamsulosin (FLOMAX) 0.4 MG CAPS capsule Take 1 capsule (0.4 mg total) by mouth daily.   No facility-administered encounter medications on file as of 12/11/2021.   Thank you for the opportunity to participate in the care of [name]  The  palliative care team will continue to follow. Please call our office at [phone] if we can be of additional assistance.  Questions and concerns were addressed. The patient/family was encouraged to call with questions and/or concerns. My business card was provided. Provided general support and encouragement, no other unmet needs identified  This chart was dictated using voice recognition software.  Despite best efforts to proofread,  errors can occur which can change the documentation meaning.   Janielle Mittelstadt Z Basya Casavant, NP ,   COVID-19 PATIENT SCREENING TOOL Asked and negative response unless otherwise noted:  Have you had symptoms of covid, tested positive or been in contact with someone with symptoms/positive test in the past 5-10 days? NO

## 2021-12-18 DIAGNOSIS — Z515 Encounter for palliative care: Secondary | ICD-10-CM

## 2021-12-18 NOTE — Progress Notes (Signed)
PC SW outreached patients spouse, Luke [name], per PC NP- Midge Aver, request to discuss: patient needs in reference to Long term planning, resources available, look at long term planning as dementia progression [name] is not able to be left alone, support group and counseling telemedicine set up, severe grieving with care giver burnout.  Spouse shared that at this time she felt she and patient were maintaining and doing well. She could not think of any pertinent needs or questions for SW at this time.   Spouse shared the only thing she wanted to mention was her struggle with bathing patient, as sometimes he does not want to get into the shower. Spouse shared that she is able to give patient a wash up daily without issue but feels she could use some assistance with actual shower days twice a week. Spouse shared that patients oldest daughter is available o come and assist with bathing but patient does not allow anyone to assist with bathing besides spouse. Outside of this concern spouse did not have any others to share. Patient eats well and has good appetite. Patient sleeps well. Patient is ambulatory but slow. Spouse is providing 50% of ADL assistance with support from children and grandchildren.  Spouse is open SW mailing resources such as caregiver support and dementia care support resources. SW will mail to address on file.  Spouse had no other questions/concerns for SW at this time. SW provided contact info to spouse for future needs.

## 2022-01-03 ENCOUNTER — Other Ambulatory Visit: Payer: No Typology Code available for payment source

## 2022-01-03 ENCOUNTER — Telehealth: Payer: Self-pay

## 2022-01-03 DIAGNOSIS — Z515 Encounter for palliative care: Secondary | ICD-10-CM

## 2022-01-03 NOTE — Telephone Encounter (Signed)
330 pm.  Request from Natalia Leatherwood, NP to complete telephonic call with patient.  Phone call made to patient but no answer.  Message left requesting a call back.  Response pending.

## 2022-01-06 ENCOUNTER — Other Ambulatory Visit: Payer: Self-pay

## 2022-01-07 NOTE — Progress Notes (Signed)
PATIENT NAME: Luke [name] DOB: 08/28/1946 MRN: [id number]  PRIMARY CARE PROVIDER: Birdie Sons, MD  RESPONSIBLE PARTY:  Acct ID - Guarantor Home Phone Work Phone Relationship Acct Type  [id number] Luke [name], [phone]  Self P/F     [address] [place], [place] [address]   Due to the COVID-19 crisis, this visit was done via telemedicine from my office and it was initiated and consent by this patient and or family.  I connected with  Luke [name] OR PROXY on 01/07/22 telephone and verified that I am speaking with the correct person using two identifiers.   I discussed the limitations of evaluation and management by telemedicine. The patient expressed understanding and agreed to proceed.   PLAN OF CARE and INTERVENTIONS:               1.  GOALS OF CARE/ ADVANCE CARE PLANNING:  Remain home under the care of his wife.               2.  PATIENT/CAREGIVER EDUCATION:  briefs and disease progression.               4. PERSONAL EMERGENCY PLAN:  Activate 911 for emergencies.               5.  DISEASE STATUS:  Connected with patient's wife telephonically at the request of Christin Gusler, NP.   Appetite:  No further weight loss noted by wife.  Patient's appetite has been fairly good.  Drink supplements are in the home but wife endorses "hit or miss" with patient  consuming this.   Anxiety:  Wife notes patient experienced an episode of anxiety related to his "tongue on fire".  Episode lasted for about 2 hours before patient settled.  No further occurrences noted by wife.   Mobility:  Patient continues to ambulate daily with wife. They are going for short walks outside when weather permits.  Wife states she remains present at his side to ensure safety.  Incontinence:  Wife shares patient is now incontinent of bowel.  This has been going on for the last 2 weeks.  She is currently using depends for bowel and bladder incontinence.  We discussed the use of adult briefs but wife feels  depends are working well at this time.  Education provided on disease progression.   Insomnia:  Wife endorses patient is sleeping much better with the use of melatonin.   HISTORY OF PRESENT ILLNESS:   SHAMOND SKELTON is a 76 y.o. year old male  with multiple medical problems including Alzheimer's with vascular dementia, polyarthritis, sleep disruption.  Patient is being followed by Palliative Care every 4-8 weeks and prn.   CODE STATUS: Full ADVANCED DIRECTIVES: No MOST FORM: No PPS: 40%          Lorenza Burton, RN

## 2022-02-05 ENCOUNTER — Other Ambulatory Visit: Payer: No Typology Code available for payment source | Admitting: Nurse Practitioner

## 2022-02-05 ENCOUNTER — Other Ambulatory Visit: Payer: Self-pay

## 2022-02-05 ENCOUNTER — Encounter: Payer: Self-pay | Admitting: Nurse Practitioner

## 2022-02-05 DIAGNOSIS — G309 Alzheimer's disease, unspecified: Secondary | ICD-10-CM | POA: Diagnosis not present

## 2022-02-05 DIAGNOSIS — R634 Abnormal weight loss: Secondary | ICD-10-CM | POA: Diagnosis not present

## 2022-02-05 DIAGNOSIS — F0280 Dementia in other diseases classified elsewhere without behavioral disturbance: Secondary | ICD-10-CM

## 2022-02-05 DIAGNOSIS — F0150 Vascular dementia without behavioral disturbance: Secondary | ICD-10-CM | POA: Diagnosis not present

## 2022-02-05 DIAGNOSIS — Z515 Encounter for palliative care: Secondary | ICD-10-CM

## 2022-02-05 NOTE — Progress Notes (Signed)
Therapist, nutritional Palliative Care Consult Note Telephone: [phone]  Fax: [phone]    Date of encounter: 02/05/22 12:36 PM PATIENT NAME: Luke [name] [address] [place] [address]   [phone] (home)  DOB: [birth year] MRN: [id number] PRIMARY CARE PROVIDER:    Malva Limes, MD,  [address] [place] [address] [place] [address] [phone] RESPONSIBLE PARTY:    Contact Information     Name Relation Home Work Slana D [place]   [phone]   Shannon [facility] Daughter   [phone]   Sung, Parodi Daughter   [phone]      I met face to face with patient and family in home. Palliative Care was asked to follow this patient by consultation request of  Fisher, Demetrios Isaacs, MD to address advance care planning and complex medical decision making. This is a follow up visit.                                  ASSESSMENT AND PLAN / RECOMMENDATIONS:  Symptom Management/Plan: 1. Advance Care Planning; Full code; family has most form; Family requesting assistance for HPCOA papers, will request PC SW to call    2. Dementia; discussed at length about chronic disease progression. We talked about cognitive decline with realistic expectations. Continue supportive role  3. Weight loss, muscle wasting, no current weight; discussed nutrition, supplements.    4. Palliative care encounter; Palliative care encounter; Palliative medicine team will continue to support patient, patient's family, and medical team. Visit consisted of counseling and education dealing with the complex and emotionally intense issues of symptom management and palliative care in the setting of serious and potentially life-threatening illness   5. f/u 2 month for ongoing monitoring chronic disease progression, ongoing discussions complex medical decision making Follow up Palliative Care Visit: Palliative care will continue to follow for complex medical decision  making, advance care planning, and clarification of goals. Return 8 weeks or prn.  I spent 62 minutes providing this consultation. More than 50% of the time in this consultation was spent in counseling and care coordination. PPS: 40% Chief Complaint: Follow up palliative consult for complex medical decision making  HISTORY OF PRESENT ILLNESS:  Luke [name] is a 76 y.o. year old male  with multiple medical problems including Alzheimer's with vascular dementia, polyarthritis, sleep disruption. I called Mrs Tino to confirm PC initial visit and covid screening negative. I visited Mr and Mrs Jue with daughter Westley Hummer in their home. I talked with Mrs Adduci and Westley Hummer prior to having Mr Buenger join visit. Mrs Lucarelli. Mr. Beutler is sitting at the kitchen table with his daughter and Mrs Sunday. We talked about functional, cognitive changes. Mr Gulbranson has been incontinent. Mrs Blades does bath Mr Goynes. We talked about ros, symptoms, appetite which Mrs Merfeld shared appears to be loosing some weight, no recent weight. Mrs Finch endorses she has been putting sweat pants on Mr Dearmas. Mrs Bergen endorses when she bathes him she sees his legs have lost muscle tone, weight, became skinny. We talked about nutrition, supplements. Charlene endorses she does take him out to eat in restaurants and does okay. Mrs Viney endorses she has to cut up some of his foods. Mrs Appleby endorses at times Mr Cropper will feed himself and then she has to feed him, sometimes he will restart feeding himself. We talked about medical goals, quality of life, realistic  expectations with decline. We talked about role pc in poc. We talked about HPCOA, will do request to [facility] SW to f/u. We talked about f/u pc visit, scheduled. Therapeutic listening, emotional support provided. Question answered   History obtained from review of EMR, discussion with Mrs Stockley and [name], [name] Patient  with Mr. Candelaria.  I reviewed available  labs, medications, imaging, studies and related documents from the EMR.  Records reviewed and summarized above.   ROS 10 point system reviewed with Mrs Holst and [name], [name] Patient negative except HPI  Physical Exam: Constitutional: NAD General: confused pleasant male EYES: lids intact ENMT: oral mucous membranes moist CV: S1S2, RRR Pulmonary: LCTA, no increased work of breathing, no cough, room air Abdomen: soft and non tender MSK: ambulatory Skin: warm and dry Neuro:  no generalized weakness,  + cognitive impairment Psych: non-anxious affect, A and Oriented to self  Thank you for the opportunity to participate in the care of [name]  The palliative care team will continue to follow. Please call our office at [phone] if we can be of additional assistance.   Nevah Dalal Ihor Gully, NP

## 2022-02-06 DIAGNOSIS — Z515 Encounter for palliative care: Secondary | ICD-10-CM

## 2022-02-06 NOTE — Progress Notes (Signed)
COMMUNITY PALLIATIVE CARE SW NOTE  PATIENT NAME: Luke [name] DOB: 18-Jan-1946 MRN: [id number]  PRIMARY CARE PROVIDER: Birdie Sons, MD  RESPONSIBLE PARTY: There is no guarantor information entered for this encounter.   TC to patients spouse, [name], per [facility] NP - C. Gusler SW referrral request to discuss HCPOA.  Spouse shared that she would to talk more with her daughters about HCPOA for patient and then outreach SW for additional questions/assistance.  SW and spouse discussed in detail the formalities and legalities of completing POA documents to include the pt being cognitively intact enough to know and understand what he is signing and who he is declaring as his attorney-in-fact in front of a notary. SW informed spouse that if pt is not cognitive enough to complete POA documents then the other options to consider guardianship for pt. Spouse stated understanding and the she was familiar with the process.  Spouse in agreement for SW to mail her HCPOA document and information on guardianship to discuss and review with her family. Spouse had no other questions for SW at this time.   SW ,mailed information to address on file along with SW contact information.      SOCIAL HX:  Social History   Tobacco Use   Smoking status: Never   Smokeless tobacco: Never   Tobacco comments:    smokes a Lexicographer with friends  Substance Use Topics   Alcohol use: Yes    Alcohol/week: 0.0 standard drinks    Comment: occasional / monthly         [place], [place]

## 2022-03-13 ENCOUNTER — Ambulatory Visit (INDEPENDENT_AMBULATORY_CARE_PROVIDER_SITE_OTHER): Payer: No Typology Code available for payment source

## 2022-03-13 ENCOUNTER — Telehealth: Payer: Self-pay

## 2022-03-13 DIAGNOSIS — Z0000 Encounter for general adult medical examination without abnormal findings: Secondary | ICD-10-CM | POA: Diagnosis not present

## 2022-03-13 DIAGNOSIS — Z008 Encounter for other general examination: Secondary | ICD-10-CM | POA: Diagnosis not present

## 2022-03-13 NOTE — Telephone Encounter (Signed)
Pt had AWV today, spoke w/ wife [name]- she is requesting a handicap placard so she doesn't have to park so far when taking him to doctor's appointments and such. Thanks! ?

## 2022-03-13 NOTE — Patient Instructions (Addendum)
Luke [name] , ?Thank you for taking time to come for your Medicare Wellness Visit. I appreciate your ongoing commitment to your health goals. Please review the following plan we discussed and let me know if I can assist you in the future.  ? ?Screening recommendations/referrals: ?Colonoscopy: 06/13/19 ?Recommended yearly ophthalmology/optometry visit for glaucoma screening and checkup ?Recommended yearly dental visit for hygiene and checkup ? ?Vaccinations: ?Influenza vaccine: 09/12/20 ?Pneumococcal vaccine: 05/12/17 ?Tdap vaccine: 04/30/10, due ?Shingles vaccine: n/d   ?Covid-19: 02/07/20, 02/28/20, 11/26/20 ? ?Advanced directives: no ? ?Conditions/risks identified: none ? ?Next appointment: Follow up in one year for your annual wellness visit. 03/18/23 @ 1:40pm by phone ? ?Preventive Care 15 Years and Older, Male ?Preventive care refers to lifestyle choices and visits with your health care provider that can promote health and wellness. ?What does preventive care include? ?A yearly physical exam. This is also called an annual well check. ?Dental exams once or twice a year. ?Routine eye exams. Ask your health care provider how often you should have your eyes checked. ?Personal lifestyle choices, including: ?Daily care of your teeth and gums. ?Regular physical activity. ?Eating a healthy diet. ?Avoiding tobacco and drug use. ?Limiting alcohol use. ?Practicing safe sex. ?Taking low doses of aspirin every day. ?Taking vitamin and mineral supplements as recommended by your health care provider. ?What happens during an annual well check? ?The services and screenings done by your health care provider during your annual well check will depend on your age, overall health, lifestyle risk factors, and family history of disease. ?Counseling  ?Your health care provider may ask you questions about your: ?Alcohol use. ?Tobacco use. ?Drug use. ?Emotional well-being. ?Home and relationship well-being. ?Sexual activity. ?Eating  habits. ?History of falls. ?Memory and ability to understand (cognition). ?Work and work Statistician. ?Screening  ?You may have the following tests or measurements: ?Height, weight, and BMI. ?Blood pressure. ?Lipid and cholesterol levels. These may be checked every 5 years, or more frequently if you are over 55 years old. ?Skin check. ?Lung cancer screening. You may have this screening every year starting at age 57 if you have a 30-pack-year history of smoking and currently smoke or have quit within the past 15 years. ?Fecal occult blood test (FOBT) of the stool. You may have this test every year starting at age 30. ?Flexible sigmoidoscopy or colonoscopy. You may have a sigmoidoscopy every 5 years or a colonoscopy every 10 years starting at age 38. ?Prostate cancer screening. Recommendations will vary depending on your family history and other risks. ?Hepatitis C blood test. ?Hepatitis B blood test. ?Sexually transmitted disease (STD) testing. ?Diabetes screening. This is done by checking your blood sugar (glucose) after you have not eaten for a while (fasting). You may have this done every 1-3 years. ?Abdominal aortic aneurysm (AAA) screening. You may need this if you are a current or former smoker. ?Osteoporosis. You may be screened starting at age 14 if you are at high risk. ?Talk with your health care provider about your test results, treatment options, and if necessary, the need for more tests. ?Vaccines  ?Your health care provider may recommend certain vaccines, such as: ?Influenza vaccine. This is recommended every year. ?Tetanus, diphtheria, and acellular pertussis (Tdap, Td) vaccine. You may need a Td booster every 10 years. ?Zoster vaccine. You may need this after age 58. ?Pneumococcal 13-valent conjugate (PCV13) vaccine. One dose is recommended after age 56. ?Pneumococcal polysaccharide (PPSV23) vaccine. One dose is recommended after age 60. ?Talk to your health care  provider about which screenings and  vaccines you need and how often you need them. ?This information is not intended to replace advice given to you by your health care provider. Make sure you discuss any questions you have with your health care provider. ?Document Released: 01/11/2016 Document Revised: 09/03/2016 Document Reviewed: 10/16/2015 ?Elsevier Interactive Patient Education ? 2017 [place]. ? ?Fall Prevention in the Home ?Falls can cause injuries. They can happen to people of all ages. There are many things you can do to make your home safe and to help prevent falls. ?What can I do on the outside of my home? ?Regularly fix the edges of walkways and driveways and fix any cracks. ?Remove anything that might make you trip as you walk through a door, such as a raised step or threshold. ?Trim any bushes or trees on the path to your home. ?Use bright outdoor lighting. ?Clear any walking paths of anything that might make someone trip, such as rocks or tools. ?Regularly check to see if handrails are loose or broken. Make sure that both sides of any steps have handrails. ?Any raised decks and porches should have guardrails on the edges. ?Have any leaves, snow, or ice cleared regularly. ?Use sand or salt on walking paths during winter. ?Clean up any spills in your garage right away. This includes oil or grease spills. ?What can I do in the bathroom? ?Use night lights. ?Install grab bars by the toilet and in the tub and shower. Do not use towel bars as grab bars. ?Use non-skid mats or decals in the tub or shower. ?If you need to sit down in the shower, use a plastic, non-slip stool. ?Keep the floor dry. Clean up any water that spills on the floor as soon as it happens. ?Remove soap buildup in the tub or shower regularly. ?Attach bath mats securely with double-sided non-slip rug tape. ?Do not have throw rugs and other things on the floor that can make you trip. ?What can I do in the bedroom? ?Use night lights. ?Make sure that you have a light by your  bed that is easy to reach. ?Do not use any sheets or blankets that are too big for your bed. They should not hang down onto the floor. ?Have a firm chair that has side arms. You can use this for support while you get dressed. ?Do not have throw rugs and other things on the floor that can make you trip. ?What can I do in the kitchen? ?Clean up any spills right away. ?Avoid walking on wet floors. ?Keep items that you use a lot in easy-to-reach places. ?If you need to reach something above you, use a strong step stool that has a grab bar. ?Keep electrical cords out of the way. ?Do not use floor polish or wax that makes floors slippery. If you must use wax, use non-skid floor wax. ?Do not have throw rugs and other things on the floor that can make you trip. ?What can I do with my stairs? ?Do not leave any items on the stairs. ?Make sure that there are handrails on both sides of the stairs and use them. Fix handrails that are broken or loose. Make sure that handrails are as long as the stairways. ?Check any carpeting to make sure that it is firmly attached to the stairs. Fix any carpet that is loose or worn. ?Avoid having throw rugs at the top or bottom of the stairs. If you do have throw rugs, attach them to the  floor with carpet tape. ?Make sure that you have a light switch at the top of the stairs and the bottom of the stairs. If you do not have them, ask someone to add them for you. ?What else can I do to help prevent falls? ?Wear shoes that: ?Do not have high heels. ?Have rubber bottoms. ?Are comfortable and fit you well. ?Are closed at the toe. Do not wear sandals. ?If you use a stepladder: ?Make sure that it is fully opened. Do not climb a closed stepladder. ?Make sure that both sides of the stepladder are locked into place. ?Ask someone to hold it for you, if possible. ?Clearly mark and make sure that you can see: ?Any grab bars or handrails. ?First and last steps. ?Where the edge of each step is. ?Use tools that  help you move around (mobility aids) if they are needed. These include: ?Canes. ?Walkers. ?Scooters. ?Crutches. ?Turn on the lights when you go into a dark area. Replace any light bulbs as soon as they burn out. ?Set

## 2022-03-13 NOTE — Progress Notes (Signed)
?Virtual Visit via Telephone Note ? ?I connected with  Luke [name] on 03/13/22 at  2:20 PM EDT by telephone and verified that I am speaking with the correct person using two identifiers. Talked w/ his wife Luke [name] ? ?Location: ?Patient: home ?Provider: [facility] ?Persons participating in the virtual visit: patient/Nurse Health Advisor ?  ?I discussed the limitations, risks, security and privacy concerns of performing an evaluation and management service by telephone and the availability of in person appointments. The patient expressed understanding and agreed to proceed. ? ?Interactive audio and video telecommunications were attempted between this nurse and patient, however failed, due to patient having technical difficulties OR patient did not have access to video capability.  We continued and completed visit with audio only. ? ?Some vital signs may be absent or patient reported.  ? ?Luke David, LPN ? ?Subjective:  ? Luke [name] is a 76 y.o. male who presents for Medicare Annual/Subsequent preventive examination. ? ?Review of Systems    ? ?  ? ?   ?Objective:  ?  ?There were no vitals filed for this visit. ?There is no height or weight on file to calculate BMI. ? ?Advanced Directives 09/11/2020 04/02/2020 07/18/2019 06/13/2019 03/01/2019 06/02/2018 05/12/2017  ?Does Patient Have a Medical Advance Directive? No No No Yes No No Yes  ?Type of Advance Directive - - - Living will;Healthcare Power of [place];Living will  ?Does patient want to make changes to medical advance directive? - - - No - Patient declined - - -  ?Copy of Tat [place] in Chart? - - - No - copy requested - - No - copy requested  ?Would patient like information on creating a medical advance directive? No - Patient declined No - Patient declined Yes (MAU/Ambulatory/Procedural Areas - Information given) - - Yes (MAU/Ambulatory/Procedural Areas - Information given) -  ? ? ?Current Medications  (verified) ?Outpatient Encounter Medications as of 03/13/2022  ?Medication Sig  ? amLODipine (NORVASC) 5 MG tablet Take 1 tablet (5 mg total) by mouth daily.  ? Cholecalciferol (VITAMIN D) 2000 units CAPS Take 1 capsule by mouth daily. occasionally  ? furosemide (LASIX) 20 MG tablet Take 1 tablet (20 mg total) by mouth daily.  ? gabapentin (NEURONTIN) 300 MG capsule TAKE 1 CAPSULE BY MOUTH TWICE A DAY  ? hydrochlorothiazide (HYDRODIURIL) 12.5 MG tablet TAKE 1 TABLET BY MOUTH EVERY DAY  ? memantine (NAMENDA) 5 MG tablet Take 5 mg by mouth daily.   ? potassium chloride SA (KLOR-CON) 20 MEQ tablet Take 1 tablet (20 mEq total) by mouth daily.  ? QUEtiapine (SEROQUEL) 25 MG tablet Take 25 mg by mouth at bedtime as needed.  ? QUEtiapine (SEROQUEL) 50 MG tablet Take 50 mg by mouth at bedtime.  ? sertraline (ZOLOFT) 100 MG tablet Take 1 tablet (100 mg total) by mouth daily.  ? tamsulosin (FLOMAX) 0.4 MG CAPS capsule Take 1 capsule (0.4 mg total) by mouth daily.  ? ?No facility-administered encounter medications on file as of 03/13/2022.  ? ? ?Allergies (verified) ?Donepezil  ? ?History: ?Past Medical History:  ?Diagnosis Date  ? Acquired absence of kidney 12/29/1988  ? Brachial neuritis 04/02/2009  ? Claudication ([place]) 02/06/2016  ? 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 Dysphagia 04/02/2009  ? GERD (gastroesophageal reflux disease) 02/06/2016  ? Schatski ring seen on EGD 07-10-11   ? History of MRSA infection 11/18/2007  ? Hyperlipidemia, mixed 12/29/1998  ? Hypertension 04/02/2009  ? Hypogonadism  male 02/06/2016  ? Insomnia 02/06/2016  ? LBP (low back pain) 07/13/2013  ? Numbness of toes 02/06/2016  ? Palpitations 02/06/2016  ? Prediabetes 02/08/2016  ? A1c=6.2 02/08/16   ? Prostate cancer [facility]) 2014  ? Seed Implants  ? Single kidney   ? Vitamin D deficiency 02/06/2016  ? ?Past Surgical History:  ?Procedure Laterality Date  ? ABI  07/09/2011  ? Normal; Left= 1.21, Right=1.00. Triphasic waver forms  ? BALLOON DILATION N/A 06/13/2019  ?  Procedure: BALLOON DILATION;  Surgeon: Lucilla Lame, MD;  Location: [place];  Service: Endoscopy;  Laterality: N/A;  ? Carotid Doppler Ultrasound  10/18/2012  ? Minimal soft plague both carotids. No hemodynamically significant stenosis  ? CHOLECYSTECTOMY  2001  ? COLONOSCOPY WITH PROPOFOL N/A 06/13/2019  ? Procedure: COLONOSCOPY WITH BIOPSIES;  Surgeon: Lucilla Lame, MD;  Location: [place];  Service: Endoscopy;  Laterality: N/A;  ? ESOPHAGOGASTRODUODENOSCOPY (EGD) WITH PROPOFOL N/A 06/13/2019  ? Procedure: ESOPHAGOGASTRODUODENOSCOPY (EGD) WITH BALLOON DILATION;  Surgeon: Lucilla Lame, MD;  Location: [place];  Service: Endoscopy;  Laterality: N/A;  ? Lumbar Spine X Ray  11/28/2011  ? Mild DDD L1- L2. Anterior heigt loss of T12-L1 vertebral bodies  ? NEPHRECTOMY Right 2001  ? done by Dr. Eliberto Ivory for Angiomyolipoma  ? PENILE PROSTHESIS  REMOVAL  11/28/2013  ? Dr. Eliberto Ivory  ? PENILE PROSTHESIS IMPLANT  2008  ? Photovaporization of prostate with green light laser  01/19/2012  ? Dr. Rogers Blocker  ? POLYPECTOMY N/A 06/13/2019  ? Procedure: POLYPECTOMY;  Surgeon: Lucilla Lame, MD;  Location: [place];  Service: Endoscopy;  Laterality: N/A;  ? [place]  ? UPPER GI ENDOSCOPY  07/10/2011  ? Done by Dr. Sharen Counter. Revealed Schatski rings  ? ?Family History  ?Problem Relation Age of Onset  ? Heart attack Mother   ? Hypertension Father   ? Prostate cancer Neg Hx   ? Bladder Cancer Neg Hx   ? Kidney cancer Neg Hx   ? ?Social History  ? ?Socioeconomic History  ? Marital status: Married  ?  Spouse name: Not on file  ? Number of children: 2  ? Years of education: Not on file  ? Highest education level: 10th grade  ?Occupational History  ? Occupation: Retired  ?Tobacco Use  ? Smoking status: Never  ? Smokeless tobacco: Never  ? Tobacco comments:  ?  smokes a Lexicographer with friends  ?Vaping Use  ? Vaping Use: Never used  ?Substance and Sexual Activity  ? Alcohol use: Yes  ?   Alcohol/week: 0.0 standard drinks  ?  Comment: occasional / monthly  ? Drug use: No  ? Sexual activity: Not Currently  ?Other Topics Concern  ? Not on file  ?Social History Narrative  ? Not on file  ? ?Social Determinants of Health  ? ?Financial Resource Strain: Not on file  ?Food Insecurity: Not on file  ?Transportation Needs: Not on file  ?Physical Activity: Not on file  ?Stress: Not on file  ?Social Connections: Not on file  ? ? ?Tobacco Counseling ?Counseling given: Not Answered ?Tobacco comments: smokes a Lexicographer with friends ? ? ?Clinical Intake: ? ?Pre-visit preparation completed: Yes ? ?Pain : No/denies pain ? ?  ? ?Nutritional Risks: None ?Diabetes: No ? ?How often do you need to have someone help you when you read instructions, pamphlets, or other written materials from your doctor or pharmacy?: 1 - Never ? ?Diabetic?no ? ?Interpreter Needed?: No ? ?  Information entered by :: Kirke Shaggy, LPN ? ? ?Activities of Daily Living ?No flowsheet data found. ? ?Patient Care Team: ?Birdie Sons, MD as PCP - General (Family Medicine) ?Abbie Sons, MD as Consulting Physician (Urology) ?Lucilla Lame, MD as Consulting Physician (Gastroenterology) ?[facility], [place] [facility]) ?Vladimir Crofts, MD as Consulting Physician (Neurology) ? ?Indicate any recent Medical Services you may have received from other than [facility] providers in the past year (date may be approximate). ? ?   ?Assessment:  ? This is a routine wellness examination for Luke [name]. ? ?Hearing/Vision screen ?No results found. ? ?Dietary issues and exercise activities discussed: ?  ? ? Goals Addressed   ?None ?  ? ?Depression Screen ?PHQ 2/9 Scores 10/15/2020 09/11/2020 08/03/2020 07/18/2019 06/02/2018 05/12/2017 05/12/2017  ?PHQ - 2 Score 0 1 0 '2 4 1 1  '$ ?PHQ- 9 Score - - - '5 13 9 '$ -  ?  ?Fall Risk ?Fall Risk  10/15/2020 09/11/2020 08/03/2020 07/18/2019 06/02/2018  ?Falls in the past year? 0 0 0 0 No  ?Number falls in past yr: 0 0 0 - -  ?Injury with  Fall? 0 0 0 - -  ? ? ?FALL RISK PREVENTION PERTAINING TO THE HOME: ? ?Any stairs in or around the home? No  ?If so, are there any without handrails? No  ?Home free of loose throw rugs in walkways, pet beds, electrical cords,

## 2022-03-14 NOTE — Telephone Encounter (Signed)
Form printed

## 2022-03-17 ENCOUNTER — Other Ambulatory Visit: Payer: Self-pay | Admitting: Family Medicine

## 2022-03-17 DIAGNOSIS — I10 Essential (primary) hypertension: Secondary | ICD-10-CM

## 2022-03-17 MED ORDER — AMLODIPINE BESYLATE 5 MG PO TABS: 5.0000 mg | ORAL_TABLET | Freq: Every day | ORAL | 4 refills | Status: DC

## 2022-03-17 NOTE — Telephone Encounter (Signed)
Last refill: 12/09/2021 #90 with 0 refills ?Last HTN follow up was 07/22/2021.  ? ?No future visit scheduled. Please advise when patient needs to come in for follow up appointment.  ?

## 2022-03-17 NOTE — Telephone Encounter (Signed)
[facility] faxed refill request for the following medications:   amLODipine (NORVASC) 5 MG tablet   Please advise.  

## 2022-03-21 ENCOUNTER — Ambulatory Visit (INDEPENDENT_AMBULATORY_CARE_PROVIDER_SITE_OTHER): Payer: No Typology Code available for payment source | Admitting: Family Medicine

## 2022-03-21 ENCOUNTER — Other Ambulatory Visit: Payer: Self-pay

## 2022-03-21 VITALS — BP 138/80 | HR 87 | Resp 16 | Wt 210.0 lb

## 2022-03-21 DIAGNOSIS — I10 Essential (primary) hypertension: Secondary | ICD-10-CM

## 2022-03-21 DIAGNOSIS — E559 Vitamin D deficiency, unspecified: Secondary | ICD-10-CM | POA: Diagnosis not present

## 2022-03-21 DIAGNOSIS — F0280 Dementia in other diseases classified elsewhere without behavioral disturbance: Secondary | ICD-10-CM

## 2022-03-21 DIAGNOSIS — F0150 Vascular dementia without behavioral disturbance: Secondary | ICD-10-CM | POA: Diagnosis not present

## 2022-03-21 DIAGNOSIS — E782 Mixed hyperlipidemia: Secondary | ICD-10-CM | POA: Diagnosis not present

## 2022-03-21 DIAGNOSIS — Z23 Encounter for immunization: Secondary | ICD-10-CM | POA: Diagnosis not present

## 2022-03-21 DIAGNOSIS — G309 Alzheimer's disease, unspecified: Secondary | ICD-10-CM

## 2022-03-21 DIAGNOSIS — R7303 Prediabetes: Secondary | ICD-10-CM | POA: Diagnosis not present

## 2022-03-21 LAB — POCT GLYCOSYLATED HEMOGLOBIN (HGB A1C)
Est. average glucose Bld gHb Est-mCnc: 114
Hemoglobin A1C: 5.6 % (ref 4.0–5.6)

## 2022-03-21 NOTE — Progress Notes (Signed)
?  ? ?I,Sulibeya S Dimas,acting as a scribe for Lelon Huh, MD.,have documented all relevant documentation on the behalf of Lelon Huh, MD,as directed by  Lelon Huh, MD while in the presence of Lelon Huh, MD. ? ? ?Established patient visit ? ? ?Patient: Luke [name]   DOB: [birth year]   76 y.o. Male  MRN: [id number] ?Visit Date: 03/21/2022 ? ?Today's healthcare provider: Lelon Huh, MD  ? ?Chief Complaint  ?Patient presents with  ? Hypertension  ? ?Subjective  ?  ?HPI  ?Hypertension, follow-up ? ?BP Readings from Last 3 Encounters:  ?07/29/21 137/88  ?07/22/21 (!) 145/90  ?03/25/21 (!) 142/82  ? Wt Readings from Last 3 Encounters:  ?03/25/21 233 lb 6.4 oz (105.9 kg)  ?10/15/20 219 lb (99.3 kg)  ?08/03/20 216 lb 9.6 oz (98.2 kg)  ?  ? ?He was last seen for hypertension 8 months ago.  ?BP at that visit was 145/90. Management since that visit includes Amlodipine 5 mg qd and HCTZ 12.5 mg qd. . ? ?He reports excellent compliance with treatment. ?He is not having side effects.  ?He is following a Regular diet. ?He is not exercising. ?He does not smoke. ? ?Outside blood pressures are stable. ?Symptoms: ?No chest pain No chest pressure  ?No palpitations No syncope  ?No dyspnea No orthopnea  ?No paroxysmal nocturnal dyspnea No lower extremity edema  ? ?Pertinent labs: ?Lab Results  ?Component Value Date  ? CHOL 221 (H) 03/26/2021  ? HDL 42 03/26/2021  ? LDLCALC 144 (H) 03/26/2021  ? TRIG 195 (H) 03/26/2021  ? CHOLHDL 5.3 (H) 03/26/2021  ? Lab Results  ?Component Value Date  ? NA 136 07/29/2021  ? K 3.6 07/29/2021  ? CREATININE 1.55 (H) 07/29/2021  ? EGFR 47 (L) 07/29/2021  ? GLUCOSE 120 (H) 07/29/2021  ? TSH 0.746 03/20/2020  ?  ? ?The 10-year ASCVD risk score (Arnett DK, et al., 2019) is: 20.7%  ? ?---------------------------------------------------------------------------------------------------  ?Prediabetes, Follow-up ? ?Lab Results  ?Component Value Date  ? HGBA1C 6.2 (H) 03/26/2021  ? HGBA1C 5.8 (A)  02/03/2020  ? HGBA1C 5.9 (H) 08/08/2019  ? GLUCOSE 120 (H) 07/29/2021  ? GLUCOSE 106 (H) 07/22/2021  ? GLUCOSE 99 03/26/2021  ? ? ?Last seen for for this1 years ago.  ?Management since that visit includes no changes. ?Current symptoms include none and have been stable. ? ?Prior visit with dietician: no ?Current diet: in general, a "healthy" diet   ?Current exercise: none ? ?----------------------------------------------------------------------------------------- ? ?Medications: ?Outpatient Medications Prior to Visit  ?Medication Sig  ? amLODipine (NORVASC) 5 MG tablet Take 1 tablet (5 mg total) by mouth daily.  ? Cholecalciferol (VITAMIN D) 2000 units CAPS Take 1 capsule by mouth daily. occasionally (Patient not taking: Reported on 03/13/2022)  ? furosemide (LASIX) 20 MG tablet Take 1 tablet (20 mg total) by mouth daily. (Patient not taking: Reported on 03/13/2022)  ? gabapentin (NEURONTIN) 300 MG capsule TAKE 1 CAPSULE BY MOUTH TWICE A DAY (Patient not taking: Reported on 03/13/2022)  ? hydrochlorothiazide (HYDRODIURIL) 12.5 MG tablet TAKE 1 TABLET BY MOUTH EVERY DAY  ? memantine (NAMENDA) 5 MG tablet Take 5 mg by mouth daily.  (Patient not taking: Reported on 03/13/2022)  ? potassium chloride SA (KLOR-CON) 20 MEQ tablet Take 1 tablet (20 mEq total) by mouth daily. (Patient not taking: Reported on 03/13/2022)  ? QUEtiapine (SEROQUEL) 25 MG tablet Take 25 mg by mouth at bedtime as needed.  ? QUEtiapine (SEROQUEL) 50 MG tablet Take 50 mg  by mouth at bedtime.  ? sertraline (ZOLOFT) 100 MG tablet Take 1 tablet (100 mg total) by mouth daily.  ? tamsulosin (FLOMAX) 0.4 MG CAPS capsule Take 1 capsule (0.4 mg total) by mouth daily. (Patient not taking: Reported on 03/13/2022)  ? ?No facility-administered medications prior to visit.  ? ? ?Review of Systems ? ? ?  Objective  ?  ?BP 138/80 (BP Location: Right Arm, Patient Position: Sitting, Cuff Size: Large)   Pulse 87   Resp 16   Wt 210 lb (95.3 kg)   SpO2 100%   BMI 28.48  kg/m?  ? ? ?Physical Exam  ? ? ?General: Appearance:     ?Overweight male in no acute distress  ?Eyes:    PERRL, conjunctiva/corneas clear, EOM's intact       ?Lungs:     Clear to auscultation bilaterally, respirations unlabored  ?Heart:    Normal heart rate. Normal rhythm. No murmurs, rubs, or gallops.    ?MS:   All extremities are intact.    ?Neurologic:   Awake, alert, oriented x 1. Persistent bilateral resting tremors.    ?   ?   ? Assessment & Plan  ?  ? ?1. Primary hypertension ?Well controlled.  Continue current medications.   ?- CBC ?- Comprehensive metabolic panel ? ?2. Vitamin D deficiency ? ?- VITAMIN D 25 Hydroxy (Vit-D Deficiency, Fractures) ? ?3. Prediabetes ?A1c is stable and below diabetes threshold.  ? ?4. Hyperlipidemia, mixed ? ?- Lipid panel ? ?5. Mixed Alzheimer's and vascular dementia ([place]) ?Stable, managed by Dr. Manuella Ghazi, in palliative care.   ?   ? ?The entirety of the information documented in the History of Present Illness, Review of Systems and Physical Exam were personally obtained by me. Portions of this information were initially documented by the CMA and reviewed by me for thoroughness and accuracy.   ? ? ?Lelon Huh, MD  ?[facility] ?[phone] (phone) ?[phone] (fax) ? ?[facility]  ?

## 2022-03-22 LAB — LIPID PANEL
Chol/HDL Ratio: 4.4 ratio (ref 0.0–5.0)
Cholesterol, Total: 190 mg/dL (ref 100–199)
HDL: 43 mg/dL (ref >39)
LDL Chol Calc (NIH): 114 mg/dL — ABNORMAL HIGH (ref 0–99)
Triglycerides: 188 mg/dL — ABNORMAL HIGH (ref 0–149)
VLDL Cholesterol Cal: 33 mg/dL (ref 5–40)

## 2022-03-22 LAB — CBC
Hematocrit: 44 % (ref 37.5–51.0)
Hemoglobin: 13.9 g/dL (ref 13.0–17.7)
MCH: 21.4 pg — ABNORMAL LOW (ref 26.6–33.0)
MCHC: 31.6 g/dL (ref 31.5–35.7)
MCV: 68 fL — ABNORMAL LOW (ref 79–97)
Platelets: 293 10*3/uL (ref 150–450)
RBC: 6.49 x10E6/uL — ABNORMAL HIGH (ref 4.14–5.80)
RDW: 18.1 % — ABNORMAL HIGH (ref 11.6–15.4)
WBC: 6.5 10*3/uL (ref 3.4–10.8)

## 2022-03-22 LAB — COMPREHENSIVE METABOLIC PANEL
ALT: 9 IU/L (ref 0–44)
AST: 14 IU/L (ref 0–40)
Albumin/Globulin Ratio: 1.5 (ref 1.2–2.2)
Albumin: 4.2 g/dL (ref 3.7–4.7)
Alkaline Phosphatase: 100 IU/L (ref 44–121)
BUN/Creatinine Ratio: 11 (ref 10–24)
BUN: 13 mg/dL (ref 8–27)
Bilirubin Total: 0.5 mg/dL (ref 0.0–1.2)
CO2: 26 mmol/L (ref 20–29)
Calcium: 9.4 mg/dL (ref 8.6–10.2)
Chloride: 100 mmol/L (ref 96–106)
Creatinine, Ser: 1.19 mg/dL (ref 0.76–1.27)
Globulin, Total: 2.8 g/dL (ref 1.5–4.5)
Glucose: 76 mg/dL (ref 70–99)
Potassium: 3.2 mmol/L — ABNORMAL LOW (ref 3.5–5.2)
Sodium: 140 mmol/L (ref 134–144)
Total Protein: 7 g/dL (ref 6.0–8.5)
eGFR: 64 mL/min/{1.73_m2} (ref >59)

## 2022-03-22 LAB — VITAMIN D 25 HYDROXY (VIT D DEFICIENCY, FRACTURES): Vit D, 25-Hydroxy: 24.1 ng/mL — ABNORMAL LOW (ref 30.0–100.0)

## 2022-04-01 ENCOUNTER — Ambulatory Visit (INDEPENDENT_AMBULATORY_CARE_PROVIDER_SITE_OTHER): Payer: No Typology Code available for payment source | Admitting: Podiatry

## 2022-04-01 ENCOUNTER — Ambulatory Visit: Payer: No Typology Code available for payment source | Admitting: Podiatry

## 2022-04-01 ENCOUNTER — Encounter: Payer: Self-pay | Admitting: Podiatry

## 2022-04-01 DIAGNOSIS — M79675 Pain in left toe(s): Secondary | ICD-10-CM

## 2022-04-01 DIAGNOSIS — B351 Tinea unguium: Secondary | ICD-10-CM

## 2022-04-01 DIAGNOSIS — M79674 Pain in right toe(s): Secondary | ICD-10-CM | POA: Diagnosis not present

## 2022-04-03 ENCOUNTER — Encounter: Payer: Self-pay | Admitting: Podiatry

## 2022-04-03 NOTE — Progress Notes (Signed)
?  Subjective:  ?Patient ID: Luke [name], male    DOB: Jan 30, 1946,  MRN: [id number] ? ?Chief Complaint  ?Patient presents with  ? Nail Problem  ? ?76 y.o. male returns for the above complaint.  Patient presents with thickened elongated dystrophic toenails x10.  Mild pain on palpation.  She is not able to debride down or stop.  He would like to debride down the nails.  He denies any other acute complaints. ? ?Objective:  ?There were no vitals filed for this visit. ?Podiatric Exam: ?Vascular: dorsalis pedis and posterior tibial pulses are palpable bilateral. Capillary return is immediate. Temperature gradient is WNL. Skin turgor WNL  ?Sensorium: Normal Semmes Weinstein monofilament test. Normal tactile sensation bilaterally. ?Nail Exam: Pt has thick disfigured discolored nails with subungual debris noted bilateral entire nail hallux through fifth toenails.  Pain on palpation to the nails. ?Ulcer Exam: There is no evidence of ulcer or pre-ulcerative changes or infection. ?Orthopedic Exam: Muscle tone and strength are WNL. No limitations in general ROM. No crepitus or effusions noted.  ?Skin: No Porokeratosis. No infection or ulcers ? ? ? ?Assessment & Plan:  ? ?1. Pain due to onychomycosis of toenails of both feet   ? ? ?Patient was evaluated and treated and all questions answered. ? ?Onychomycosis with pain  ?-Nails palliatively debrided as below. ?-Educated on self-care ? ?Procedure: Nail Debridement ?Rationale: pain  ?Type of Debridement: manual, sharp debridement. ?Instrumentation: Nail nipper, rotary burr. ?Number of Nails: 10 ? ?Procedures and Treatment: Consent by patient was obtained for treatment procedures. The patient understood the discussion of treatment and procedures well. All questions were answered thoroughly reviewed. Debridement of mycotic and hypertrophic toenails, 1 through 5 bilateral and clearing of subungual debris. No ulceration, no infection noted.  ?Return Visit-Office Procedure: Patient  instructed to return to the office for a follow up visit 3 months for continued evaluation and treatment. ? ?Boneta Lucks, DPM ?  ? ?Return in about 3 months (around 07/01/2022) for [employer] . ? ?

## 2022-04-22 ENCOUNTER — Encounter: Payer: Self-pay | Admitting: Nurse Practitioner

## 2022-04-22 ENCOUNTER — Telehealth: Payer: No Typology Code available for payment source | Admitting: Nurse Practitioner

## 2022-04-22 DIAGNOSIS — Z515 Encounter for palliative care: Secondary | ICD-10-CM

## 2022-04-22 DIAGNOSIS — F0280 Dementia in other diseases classified elsewhere without behavioral disturbance: Secondary | ICD-10-CM

## 2022-04-22 NOTE — Progress Notes (Signed)
? ? ?Manufacturing engineer ?Community Palliative [facility] Consult Note ?Telephone: [phone]  ?Fax: [phone]  ? ? ?Date of encounter: 04/22/22 ?4:08 PM ?PATIENT NAME: [name] ?[place] [address]   ?[phone] (home)  ?DOB: 27-Feb-1946 ?MRN: [id number] ?PRIMARY CARE PROVIDER:    ?Birdie Sons, MD,  ?[address] ?[place] [address] ?[phone] ?RESPONSIBLE PARTY:    ?Contact Information   ? ? Name Relation Home Work Mobile  ? Lliam, Hoh Spouse   [phone]  ? Adi, Doro Daughter   [phone]  ? Bartow, Zylstra Daughter   [phone]  ? ?  ? ? ?Due to the COVID-19 crisis, this visit was done via telemedicine from my office and it was initiated and consent by this patient and or family. ? ?I connected with Mrs Wacha with [facility] on 04/22/22 by a video enabled telemedicine application and verified that I am speaking with the correct person using two identifiers. ?  ?I discussed the limitations of evaluation and management by telemedicine. The patient expressed understanding and agreed to proceed. Palliative Care was asked to follow this patient by consultation request of  Fisher, Kirstie Peri, MD to address advance care planning and complex medical decision making. This is a follow up visit.                                  ?ASSESSMENT AND PLAN / RECOMMENDATIONS: ?Symptom Management/Plan: ?1. Advance Care Planning; Full code; ongoing discussions ?  ?2. Dementia; stable at present time, no new changes; discussed at length about chronic disease progression. We talked about functional and cognitive abilities, no new changes since last PC visit. We talked about support system, his daughters continue to take Mr. Mayol on outings, no behaviors. Mr. Norden is re-directable. We talked about caregiver fatigue, stressors, self care, coping strategies. We talked about chronic disease progression, medical goals reviewed. Continue supportive  role ?  ?3. Weight loss no current weight loss noted; no change in clothes size, good appetite, eating 100% of meals; discussed nutrition, supplements. Continue to monitor weights ?  ?4. Palliative care encounter; Palliative care encounter; Palliative medicine team will continue to support patient, patient's family, and medical team. Visit consisted of counseling and education dealing with the complex and emotionally intense issues of symptom management and palliative care in the setting of serious and potentially life-threatening illness ?  ?5. f/u 2 month for ongoing monitoring chronic disease progression, ongoing discussions complex medical decision making ? ?Follow up Palliative Care Visit: Palliative care will continue to follow for complex medical decision making, advance care planning, and clarification of goals. Return 8 weeks or prn. ? ?I spent 42 minutes providing this consultation. More than 50% of the time in this consultation was spent in counseling and care coordination. ?PPS: 50% ? ?Chief Complaint: Follow up palliative consult for complex medical decision making ? ?HISTORY OF PRESENT ILLNESS:  DONAVAN KERLIN is a 76 y.o. year old male  with multiple medical problems including Alzheimer's with vascular dementia, polyarthritis, sleep disruption. I called Mrs Nack to confirm PC initial visit and covid screening negative. I connected with Mr and Mrs Mijangos by [place] for f/u [facility] visit. We talked about purpose of visit to review any new symptoms, any progressive functional or cognitive decline, appetite, sleep, behaviors, new concerns, Mrs Portales in agreement. We talked about ros, symptoms, appetite which Mrs Larmon shared  does not appear to be loosing weight, no recent weight, no change in clothes size. ROS, talked about sleep patterns, bowels, behaviors, cognitive and functional abilities. No recent falls, wounds, infections, hospitalizations. We talked about appetite. We talked about nutrition,  supplements. Charlene endorses she does take him out to eat in restaurants and does okay. Mrs Wilford endorses she has to cut up some of his foods. We talked about medical goals, quality of life, realistic expectations with decline. We talked about role pc in poc. We talked about f/u pc visit, scheduled. Therapeutic listening, emotional support provided. Question answered  Currently Mr Conroy is stable; Continue to monitor and support. ? ?History obtained from review of EMR, discussion with primary team, and interview with family, facility staff/caregiver and/or [name]  ?I reviewed available labs, medications, imaging, studies and related documents from the EMR.  Records reviewed and summarized above.  ? ?ROS ?10 point system reviewed with Mrs Rabideau as Mr Hallisey is cognitively impaired ? ?Physical Exam: ?deferred ? ?Thank you for the opportunity to participate in the care of [name]  The palliative care team will continue to follow. Please call our office at [phone] if we can be of additional assistance.  ? ?Yuvaan Olander Z Lydiana Milley, NP  ?   ?

## 2022-04-30 ENCOUNTER — Other Ambulatory Visit: Payer: Self-pay | Admitting: Family Medicine

## 2022-06-09 DIAGNOSIS — G479 Sleep disorder, unspecified: Secondary | ICD-10-CM | POA: Diagnosis not present

## 2022-06-09 DIAGNOSIS — E538 Deficiency of other specified B group vitamins: Secondary | ICD-10-CM | POA: Diagnosis not present

## 2022-06-09 DIAGNOSIS — M130 Polyarthritis, unspecified: Secondary | ICD-10-CM | POA: Diagnosis not present

## 2022-06-09 DIAGNOSIS — F0280 Dementia in other diseases classified elsewhere without behavioral disturbance: Secondary | ICD-10-CM | POA: Diagnosis not present

## 2022-06-09 DIAGNOSIS — G309 Alzheimer's disease, unspecified: Secondary | ICD-10-CM | POA: Diagnosis not present

## 2022-06-09 DIAGNOSIS — F0150 Vascular dementia without behavioral disturbance: Secondary | ICD-10-CM | POA: Diagnosis not present

## 2022-06-19 ENCOUNTER — Telehealth: Payer: No Typology Code available for payment source | Admitting: Nurse Practitioner

## 2022-07-03 ENCOUNTER — Ambulatory Visit: Payer: No Typology Code available for payment source | Admitting: Podiatry

## 2022-07-04 ENCOUNTER — Encounter: Payer: No Typology Code available for payment source | Admitting: Nurse Practitioner

## 2022-07-04 ENCOUNTER — Telehealth: Payer: Self-pay | Admitting: Nurse Practitioner

## 2022-07-04 NOTE — Progress Notes (Signed)
This encounter was created in error - please disregard.

## 2022-07-04 NOTE — Telephone Encounter (Signed)
I attempted to contact Ms. Luke [name] for telephone telemedicine f/u PC visit, no answer, message left with contact information

## 2022-07-08 ENCOUNTER — Other Ambulatory Visit: Payer: No Typology Code available for payment source | Admitting: Nurse Practitioner

## 2022-07-08 ENCOUNTER — Encounter: Payer: Self-pay | Admitting: Nurse Practitioner

## 2022-07-08 DIAGNOSIS — R634 Abnormal weight loss: Secondary | ICD-10-CM

## 2022-07-08 DIAGNOSIS — F0150 Vascular dementia without behavioral disturbance: Secondary | ICD-10-CM

## 2022-07-08 DIAGNOSIS — Z515 Encounter for palliative care: Secondary | ICD-10-CM

## 2022-07-08 NOTE — Progress Notes (Signed)
[facility] Consult Note Telephone: [phone]  Fax: [phone]    Date of encounter: 07/08/22 2:16 PM [name] NAME: Luke [name] [address] [place] [address]   [phone] (home)  DOB: 05/23/1946 MRN: [id number] PRIMARY CARE PROVIDER:    Birdie Sons, MD,  [address] [place] [address] [phone] RESPONSIBLE PARTY:    Contact Information     Name Relation Home Work Luke [name]   [phone]   [facility] [name]   [phone]   Damain, Broadus [name]   [phone]      I met face to face with [name] and family in home. Palliative Care was asked to follow this [name] by consultation request of  Fisher, Kirstie Peri, MD to address advance care planning and complex medical decision making. This is a follow up visit.                                  ASSESSMENT AND PLAN / RECOMMENDATIONS:  Symptom Management/Plan: 1. Advance Care Planning; Full code; ongoing discussions  2. Dementia; discussed at length about chronic disease progression. We talked about activity mat; adult puzzles; Continue supportive role. Sent in a replacement bp monitor   3. Weight loss, muscle wasting, 10 lbs loss; discussed nutrition, supplements.    5. Palliative care encounter; Palliative care encounter; Palliative medicine team will continue to support [name], [name]'s family, and medical team. Visit consisted of counseling and education dealing with the complex and emotionally intense issues of symptom management and palliative care in the setting of serious and potentially life-threatening illness   5. f/u 3 month for ongoing monitoring chronic disease progression, ongoing discussions complex medical decision making Follow up Palliative Care Visit: Palliative care will continue to follow for complex medical decision making, advance care planning, and clarification of goals. Return 8  weeks or prn.   I spent 61 minutes providing this consultation. More than 50% of the time in this consultation was spent in counseling and care coordination. PPS: 40% Chief Complaint: Follow up palliative consult for complex medical decision making   HISTORY OF PRESENT ILLNESS:  Luke [name] is a 76 y.o. year old male  with multiple medical problems including Alzheimer's with vascular dementia, polyarthritis, sleep disruption. I called Luke [name] to confirm PC initial visit and covid screening negative. I visited Luke [name] with [name] Luke [name] in their home. We talked about updates with neurology appointment. Luke [name] endorses since the aggressive behaviors have ceased as well as the wandering behaviors, discussed to decrease Seroquel and Sertraline. We talked about chronic disease progression, remains ambulatory, no recent falls. We talked about ros, symptoms. We talked about weight loss, supplements and noted loss of muscle mass. We talked about weighted spoon may benefit. We talked about Luke [name] usually won't pick up a utensil, given the first bite he will then continue to feed himself. Luke [name] endorses she takes him out to eat, does well. We talked about daily routine, activities. Luke [name] talked about their grandchildren's interaction with Luke [name].  We talked about quality of life, currently stable. We talked about no new changes, no recent infections, hospitalizations. Discussed Ms. Wuest blood pressure monitor is over 53 years old, will send in rx new bp monitor by escribe to [employer]. Medical goals reviewed. We talked about role pc in poc. We talked about f/u  pc visit, scheduled. Therapeutic listening, emotional support provided. Question answered    History obtained from review of EMR, discussion with Luke [name] and Luke [name], Luke [name]  with Luke [name].  I reviewed available labs, medications, imaging, studies and related documents from the EMR.  Records  reviewed and summarized above.    ROS 10 point system reviewed with Luke Cassatt and Luke [name], Luke [name] negative except HPI   Physical Exam: Constitutional: NAD General: confused pleasant male EYES: lids intact ENMT: oral mucous membranes moist CV: S1S2, RRR Pulmonary: LCTA, no increased work of breathing, no cough, room air Abdomen: soft and non tender MSK: ambulatory Skin: warm and dry Neuro:  no generalized weakness,  + cognitive impairment Psych: non-anxious affect, A and Oriented to self Thank you for the opportunity to participate in the care of Luke [name].  The palliative care team will continue to follow. Please call our office at [phone] if we can be of additional assistance.   Lanesha Azzaro Z Arlow Spiers, NP   COVID-19 [name] SCREENING TOOL Asked and negative response unless otherwise noted:   Have you had symptoms of covid, tested positive or been in contact with someone with symptoms/positive test in the past 5-10 days? no

## 2022-08-14 ENCOUNTER — Other Ambulatory Visit: Payer: Self-pay | Admitting: Family Medicine

## 2022-08-14 DIAGNOSIS — F410 Panic disorder [episodic paroxysmal anxiety] without agoraphobia: Secondary | ICD-10-CM

## 2022-08-14 NOTE — Telephone Encounter (Signed)
Last refill: 07/11/2021 #90 with 4 refills  Last office visit: 03/21/2022 (f/u chronic problems. Depression was not addressed during that visit).   NO future OV scheduled

## 2022-09-17 ENCOUNTER — Other Ambulatory Visit: Payer: No Typology Code available for payment source | Admitting: Nurse Practitioner

## 2022-10-23 ENCOUNTER — Encounter: Payer: Self-pay | Admitting: Nurse Practitioner

## 2022-10-23 ENCOUNTER — Other Ambulatory Visit: Payer: No Typology Code available for payment source | Admitting: Nurse Practitioner

## 2022-10-23 DIAGNOSIS — F0150 Vascular dementia without behavioral disturbance: Secondary | ICD-10-CM | POA: Diagnosis not present

## 2022-10-23 DIAGNOSIS — Z515 Encounter for palliative care: Secondary | ICD-10-CM

## 2022-10-23 DIAGNOSIS — F0280 Dementia in other diseases classified elsewhere without behavioral disturbance: Secondary | ICD-10-CM | POA: Diagnosis not present

## 2022-10-23 DIAGNOSIS — G309 Alzheimer's disease, unspecified: Secondary | ICD-10-CM | POA: Diagnosis not present

## 2022-10-23 NOTE — Progress Notes (Signed)
[facility] Consult Note Telephone: [phone]  Fax: [phone]    Date of encounter: 10/23/22 1:04 PM [name] NAME: Luke [name] [address] [place] [address]   [phone] (home)  DOB: [birth year] MRN: [id number] PRIMARY CARE PROVIDER:    Birdie Sons, [name],  [address] [place] [address] [phone]  RESPONSIBLE PARTY:    Contact Information     Name Relation Home Work [place] D [place]   [phone]   [facility] Daughter   [phone]   Shaine, Newmark Daughter   [phone]       I met face to face with [name] and family in home. Palliative Care was asked to follow this [name] by consultation request of  Luke [name] to address advance care planning and complex medical decision making. This is a follow up visit.                                  ASSESSMENT AND PLAN / RECOMMENDATIONS:  Symptom Management/Plan: 1. Advance Care Planning; Full code; ongoing discussions   2. Dementia; discussed at length about chronic disease progression. Educated with expectations with progression, defer needs back to primary provider. Luke [name] being stable will PC NP to sign off and re-consult if declines. Luke [name] in agreement with poc.    3. Palliative care encounter; Palliative care encounter; Palliative medicine team will continue to support [name], [name]'s family, and medical team. Visit consisted of counseling and education dealing with the complex and emotionally intense issues of symptom management and palliative care in the setting of serious and potentially life-threatening illness   I spent 41 minutes providing this consultation. More than 50% of the time in this consultation was spent in counseling and care coordination. PPS: 40% Chief Complaint: Follow up palliative consult for complex medical decision making   HISTORY OF PRESENT ILLNESS:  Luke [name] is a 76 y.o. year old male  with multiple medical problems including Alzheimer's with vascular dementia, polyarthritis, sleep disruption. I called Luke [name] to confirm PC initial visit and covid screening negative. I visited Mr and Luke [name] with daughter Luke [name] in their home. We talked how Luke [name] has been doing, no aggressive behaviors, no noted weight loss or change in clothes size, ros, no difficulty sleeping, no problems with bowel regimen, no further functional or cognitive decline. We talked about medical goals, pc program change with Luke [name] being stable will PC NP to sign off and re-consult if declines. Luke [name] in agreement with poc.  Therapeutic listening, emotional support provided. Question answered    History obtained from review of EMR, discussion with Luke [name] and Luke [name] daughter, Luke [name]  with Luke [name].  I reviewed available labs, medications, imaging, studies and related documents from the EMR.  Records reviewed and summarized above.    ROS 10 point system reviewed with Luke Jakubiak and Luke [name] daughter, Luke [name] negative except HPI   Physical Exam: Constitutional: NAD General: confused pleasant male EYES: lids intact ENMT: oral mucous membranes moist CV: S1S2, RRR Pulmonary: LCTA, no increased work of breathing, no cough, room air Abdomen: soft and non tender MSK: ambulatory Skin: warm and dry Neuro:  no generalized weakness,  + cognitive impairment Psych: non-anxious affect, Alert, briefly makes eye contact  Thank you for the opportunity to participate in the care of Luke [name].  The palliative care team will continue to follow. Please call our office at [phone] if we can be of additional assistance.   Luke Hedding Ihor Gully, NP

## 2022-10-30 ENCOUNTER — Encounter: Payer: Self-pay | Admitting: Podiatry

## 2022-10-30 ENCOUNTER — Ambulatory Visit (INDEPENDENT_AMBULATORY_CARE_PROVIDER_SITE_OTHER): Payer: No Typology Code available for payment source | Admitting: Podiatry

## 2022-10-30 DIAGNOSIS — F0150 Vascular dementia without behavioral disturbance: Secondary | ICD-10-CM | POA: Diagnosis not present

## 2022-10-30 DIAGNOSIS — G309 Alzheimer's disease, unspecified: Secondary | ICD-10-CM

## 2022-10-30 DIAGNOSIS — M79674 Pain in right toe(s): Secondary | ICD-10-CM

## 2022-10-30 DIAGNOSIS — B351 Tinea unguium: Secondary | ICD-10-CM | POA: Diagnosis not present

## 2022-10-30 DIAGNOSIS — F0280 Dementia in other diseases classified elsewhere without behavioral disturbance: Secondary | ICD-10-CM | POA: Diagnosis not present

## 2022-10-30 DIAGNOSIS — M79675 Pain in left toe(s): Secondary | ICD-10-CM

## 2022-10-30 NOTE — Progress Notes (Signed)

## 2022-12-05 DIAGNOSIS — Z008 Encounter for other general examination: Secondary | ICD-10-CM | POA: Diagnosis not present

## 2022-12-05 DIAGNOSIS — G309 Alzheimer's disease, unspecified: Secondary | ICD-10-CM | POA: Diagnosis not present

## 2022-12-05 DIAGNOSIS — R32 Unspecified urinary incontinence: Secondary | ICD-10-CM | POA: Diagnosis not present

## 2022-12-05 DIAGNOSIS — I10 Essential (primary) hypertension: Secondary | ICD-10-CM | POA: Diagnosis not present

## 2022-12-05 DIAGNOSIS — R2681 Unsteadiness on feet: Secondary | ICD-10-CM | POA: Diagnosis not present

## 2022-12-05 DIAGNOSIS — Z6829 Body mass index (BMI) 29.0-29.9, adult: Secondary | ICD-10-CM | POA: Diagnosis not present

## 2022-12-05 DIAGNOSIS — I700 Atherosclerosis of aorta: Secondary | ICD-10-CM | POA: Diagnosis not present

## 2022-12-05 DIAGNOSIS — E663 Overweight: Secondary | ICD-10-CM | POA: Diagnosis not present

## 2022-12-05 DIAGNOSIS — R159 Full incontinence of feces: Secondary | ICD-10-CM | POA: Diagnosis not present

## 2022-12-05 DIAGNOSIS — Z86010 Personal history of colonic polyps: Secondary | ICD-10-CM | POA: Diagnosis not present

## 2022-12-05 DIAGNOSIS — M1990 Unspecified osteoarthritis, unspecified site: Secondary | ICD-10-CM | POA: Diagnosis not present

## 2023-01-25 ENCOUNTER — Other Ambulatory Visit: Payer: Self-pay

## 2023-01-25 ENCOUNTER — Emergency Department
Admission: EM | Admit: 2023-01-25 | Discharge: 2023-01-25 | Disposition: A | Discharge status: Home / Self Care | Payer: No Typology Code available for payment source | Attending: Emergency Medicine | Admitting: Emergency Medicine

## 2023-01-25 ENCOUNTER — Emergency Department: Payer: No Typology Code available for payment source

## 2023-01-25 DIAGNOSIS — R55 Syncope and collapse: Secondary | ICD-10-CM

## 2023-01-25 DIAGNOSIS — K449 Diaphragmatic hernia without obstruction or gangrene: Secondary | ICD-10-CM | POA: Diagnosis not present

## 2023-01-25 DIAGNOSIS — I651 Occlusion and stenosis of basilar artery: Secondary | ICD-10-CM

## 2023-01-25 DIAGNOSIS — I6603 Occlusion and stenosis of bilateral middle cerebral arteries: Secondary | ICD-10-CM | POA: Diagnosis not present

## 2023-01-25 DIAGNOSIS — G309 Alzheimer's disease, unspecified: Secondary | ICD-10-CM | POA: Insufficient documentation

## 2023-01-25 DIAGNOSIS — I6503 Occlusion and stenosis of bilateral vertebral arteries: Secondary | ICD-10-CM | POA: Insufficient documentation

## 2023-01-25 DIAGNOSIS — I672 Cerebral atherosclerosis: Secondary | ICD-10-CM | POA: Diagnosis not present

## 2023-01-25 DIAGNOSIS — I6613 Occlusion and stenosis of bilateral anterior cerebral arteries: Secondary | ICD-10-CM | POA: Diagnosis not present

## 2023-01-25 DIAGNOSIS — M79605 Pain in left leg: Secondary | ICD-10-CM | POA: Diagnosis not present

## 2023-01-25 DIAGNOSIS — T699XXA Effect of reduced temperature, unspecified, initial encounter: Secondary | ICD-10-CM | POA: Diagnosis not present

## 2023-01-25 DIAGNOSIS — I10 Essential (primary) hypertension: Secondary | ICD-10-CM | POA: Diagnosis not present

## 2023-01-25 DIAGNOSIS — F0280 Dementia in other diseases classified elsewhere without behavioral disturbance: Secondary | ICD-10-CM | POA: Diagnosis not present

## 2023-01-25 DIAGNOSIS — R6889 Other general symptoms and signs: Secondary | ICD-10-CM | POA: Diagnosis not present

## 2023-01-25 DIAGNOSIS — R29818 Other symptoms and signs involving the nervous system: Secondary | ICD-10-CM | POA: Diagnosis not present

## 2023-01-25 DIAGNOSIS — Z1152 Encounter for screening for COVID-19: Secondary | ICD-10-CM | POA: Insufficient documentation

## 2023-01-25 DIAGNOSIS — M79604 Pain in right leg: Secondary | ICD-10-CM | POA: Diagnosis not present

## 2023-01-25 DIAGNOSIS — Z743 Need for continuous supervision: Secondary | ICD-10-CM | POA: Diagnosis not present

## 2023-01-25 LAB — CBC WITH DIFFERENTIAL/PLATELET
Abs Immature Granulocytes: 0.01 10*3/uL (ref 0.00–0.07)
Basophils Absolute: 0 10*3/uL (ref 0.0–0.1)
Basophils Relative: 0 %
Eosinophils Absolute: 0 10*3/uL (ref 0.0–0.5)
Eosinophils Relative: 0 %
HCT: 42.1 % (ref 39.0–52.0)
Hemoglobin: 13.1 g/dL (ref 13.0–17.0)
Immature Granulocytes: 0 %
Lymphocytes Relative: 24 %
Lymphs Abs: 1.7 10*3/uL (ref 0.7–4.0)
MCH: 21.4 pg — ABNORMAL LOW (ref 26.0–34.0)
MCHC: 31.1 g/dL (ref 30.0–36.0)
MCV: 68.7 fL — ABNORMAL LOW (ref 80.0–100.0)
Monocytes Absolute: 0.4 10*3/uL (ref 0.1–1.0)
Monocytes Relative: 5 %
Neutro Abs: 5 10*3/uL (ref 1.7–7.7)
Neutrophils Relative %: 71 %
Platelets: 261 10*3/uL (ref 150–400)
RBC: 6.13 MIL/uL — ABNORMAL HIGH (ref 4.22–5.81)
RDW: 17.5 % — ABNORMAL HIGH (ref 11.5–15.5)
WBC: 7.2 10*3/uL (ref 4.0–10.5)
nRBC: 0 % (ref 0.0–0.2)

## 2023-01-25 LAB — COMPREHENSIVE METABOLIC PANEL
ALT: 9 U/L (ref 0–44)
AST: 15 U/L (ref 15–41)
Albumin: 4.1 g/dL (ref 3.5–5.0)
Alkaline Phosphatase: 72 U/L (ref 38–126)
Anion gap: 2 — ABNORMAL LOW (ref 5–15)
BUN: 20 mg/dL (ref 8–23)
CO2: 31 mmol/L (ref 22–32)
Calcium: 9.1 mg/dL (ref 8.9–10.3)
Chloride: 106 mmol/L (ref 98–111)
Creatinine, Ser: 1.27 mg/dL — ABNORMAL HIGH (ref 0.61–1.24)
GFR, Estimated: 59 mL/min — ABNORMAL LOW (ref >60)
Glucose, Bld: 101 mg/dL — ABNORMAL HIGH (ref 70–99)
Potassium: 3.3 mmol/L — ABNORMAL LOW (ref 3.5–5.1)
Sodium: 139 mmol/L (ref 135–145)
Total Bilirubin: 1 mg/dL (ref 0.3–1.2)
Total Protein: 7.6 g/dL (ref 6.5–8.1)

## 2023-01-25 LAB — LACTIC ACID, PLASMA
Lactic Acid, Venous: 1.1 mmol/L (ref 0.5–1.9)
Lactic Acid, Venous: 1.8 mmol/L (ref 0.5–1.9)

## 2023-01-25 LAB — TROPONIN I (HIGH SENSITIVITY)
Troponin I (High Sensitivity): 6 ng/L (ref <18)
Troponin I (High Sensitivity): 5 ng/L (ref <18)

## 2023-01-25 LAB — PHOSPHORUS: Phosphorus: 3.1 mg/dL (ref 2.5–4.6)

## 2023-01-25 LAB — URINALYSIS, ROUTINE W REFLEX MICROSCOPIC
Bilirubin Urine: NEGATIVE
Glucose, UA: NEGATIVE mg/dL
Hgb urine dipstick: NEGATIVE
Ketones, ur: NEGATIVE mg/dL
Leukocytes,Ua: NEGATIVE
Nitrite: NEGATIVE
Protein, ur: NEGATIVE mg/dL
Specific Gravity, Urine: 1.03 (ref 1.005–1.030)
pH: 8 (ref 5.0–8.0)

## 2023-01-25 LAB — TSH: TSH: 2.879 u[IU]/mL (ref 0.350–4.500)

## 2023-01-25 LAB — RESP PANEL BY RT-PCR (RSV, FLU A&B, COVID)  RVPGX2
Influenza A by PCR: NEGATIVE
Influenza B by PCR: NEGATIVE
Resp Syncytial Virus by PCR: NEGATIVE
SARS Coronavirus 2 by RT PCR: NEGATIVE

## 2023-01-25 LAB — MAGNESIUM: Magnesium: 2 mg/dL (ref 1.7–2.4)

## 2023-01-25 LAB — PROTIME-INR
INR: 1.1 (ref 0.8–1.2)
Prothrombin Time: 13.8 seconds (ref 11.4–15.2)

## 2023-01-25 LAB — T4, FREE: Free T4: 0.76 ng/dL (ref 0.61–1.12)

## 2023-01-25 MED ORDER — IOHEXOL 350 MG/ML SOLN
75.0000 mL | Freq: Once | INTRAVENOUS | Status: AC | PRN
  Administered 2023-01-25: 75 mL via INTRAVENOUS

## 2023-01-25 MED ORDER — ASPIRIN 81 MG PO TBEC: 81.0000 mg | DELAYED_RELEASE_TABLET | Freq: Every day | ORAL | 2 refills | Status: AC

## 2023-01-25 MED ORDER — POTASSIUM CHLORIDE 20 MEQ PO PACK
40.0000 meq | PACK | Freq: Once | ORAL | Status: AC
  Administered 2023-01-25: 40 meq via ORAL
  Filled 2023-01-25: qty 2

## 2023-01-25 MED ORDER — SODIUM CHLORIDE 0.9 % IV BOLUS
1000.0000 mL | Freq: Once | INTRAVENOUS | Status: AC
  Administered 2023-01-25: 1000 mL via INTRAVENOUS

## 2023-01-25 NOTE — ED Provider Notes (Signed)
[facility] Provider Note    Event Date/Time   First MD Initiated Contact with Patient 01/25/23 1121     (approximate)   History   Near Syncope and Fall   HPI  Luke [name] is a 77 y.o. male   Past medical history of mixed Alzheimer's and vascular dementia, prediabetes, GERD, hypertension, hyperlipidemia, presents to the emergency department with generalized weakness and presyncope.  He has advanced dementia and is unable to relay his symptoms and is quite disoriented at baseline per his wife who is his primary caretaker at bedside.  He lives at home with his wife.  She describes an episode this morning where he was at the sink and began to squat down slowly never lost consciousness and was held by his wife and lowered slowly to the ground.  No significant sustained trauma.  Other than this episode he has been in his regular state of health which is to say he has chronic back pain and chronic bilateral knee pain and has unsteady gait at baseline needs very close supervision and assistance by his wife with most activities at home.  Denies recent illnesses or trauma, denies acute pain or other medical complaints, wife states that they were in normal state of health in preceding days with a mild nasal congestion that nearly resolved from earlier this week awoke this morning had assistance to the bathroom to urinate, and was getting washed with a washcloth at the sink which is the regular morning routine when this episode happened.  Mental status has been at baseline.  P.o. intake has been adequate and at baseline, no reports of vomiting, fever, diarrhea, recent trauma.  EMS reports that the patient had oral temperature of 94.4 and initial blood pressure of 80/56 which improved to a systolic blood pressure of 120 after minimal IV fluids 200 cc of normal saline given by EMS.  Independent Historian contributed to assessment above: Patient's wife      Physical  Exam   Triage Vital Signs: ED Triage Vitals [01/25/23 1056]  Enc Vitals Group     BP (!) 160/94     Pulse Rate (!) 54     Resp 18     Temp (!) 96.1 F (35.6 C)     Temp Source Rectal     SpO2 99 %     Weight      Height      Head Circumference      Peak Flow      Pain Score      Pain Loc      Pain Edu?      Excl. in [place]?     Most recent vital signs: Vitals:   01/25/23 1100 01/25/23 1355  BP: (!) 166/86 (!) 183/99  Pulse: (!) 56 64  Resp: 13 14  Temp:    SpO2: 98% 99%    General: Awake, no distress.  CV:  Good peripheral perfusion.  Resp:  Normal effort.  Abd:  No distention.  Other:  Awake alert disoriented which is baseline.  Abdomen soft and nontender no rigidity or guarding lungs clear to auscultation bilaterally hemodynamics significant for hypertension now at 160 over 90s and slightly bradycardic in the 50s and rectal temperature is low at 96.1 oxygen saturation 99% on room air with no respiratory distress.  He is able to intermittently follow commands he has no gaze preference no facial asymmetry no dysarthria per wife, and he is able to move all extremities.  It is unclear whether due to dementia and inability to follow instructions or not but perhaps his right side is slightly weaker than the left side of both upper and lower extremities but again difficult to interpret in the setting of his inability to follow commands.    ED Results / Procedures / Treatments   Labs (all labs ordered are listed, but only abnormal results are displayed) Labs Reviewed  COMPREHENSIVE METABOLIC PANEL - Abnormal; Notable for the following components:      Result Value   Potassium 3.3 (*)    Glucose, Bld 101 (*)    Creatinine, Ser 1.27 (*)    GFR, Estimated 59 (*)    Anion gap 2 (*)    All other components within normal limits  CBC WITH DIFFERENTIAL/PLATELET - Abnormal; Notable for the following components:   RBC 6.13 (*)    MCV 68.7 (*)    MCH 21.4 (*)    RDW 17.5 (*)    All  other components within normal limits  URINALYSIS, ROUTINE W REFLEX MICROSCOPIC - Abnormal; Notable for the following components:   Color, Urine YELLOW (*)    APPearance HAZY (*)    All other components within normal limits  RESP PANEL BY RT-PCR (RSV, FLU A&B, COVID)  RVPGX2  CULTURE, BLOOD (ROUTINE X 2)  CULTURE, BLOOD (ROUTINE X 2)  LACTIC ACID, PLASMA  LACTIC ACID, PLASMA  PROTIME-INR  TSH  T4, FREE  MAGNESIUM  PHOSPHORUS  TROPONIN I (HIGH SENSITIVITY)  TROPONIN I (HIGH SENSITIVITY)     I ordered and reviewed the above labs they are notable for a creatinine of 1.27 from 1.19, potassium of 3.3, lactic acid of 1.1 and a normal white blood cell count of 7.2.  His hemoglobin is at baseline 13.1 and he has a normal glucose of 101  EKG  ED ECG REPORT I, Lucillie Garfinkel, the attending physician, personally viewed and interpreted this ECG.   Date: 01/25/2023  EKG Time: 1057  Rate: 77  Rhythm: nsr  Axis: nl  Intervals: long qtc 515  ST&T Change: No acute ischemic changes    RADIOLOGY I independently reviewed and interpreted chest x-ray see no obvious consolidations or pneumothorax   PROCEDURES:  Critical Care performed: No  Procedures   MEDICATIONS ORDERED IN ED: Medications  sodium chloride 0.9 % bolus 1,000 mL (0 mLs Intravenous Stopped 01/25/23 1400)  iohexol (OMNIPAQUE) 350 MG/ML injection 75 mL (75 mLs Intravenous Contrast Given 01/25/23 1220)  potassium chloride (KLOR-CON) packet 40 mEq (40 mEq Oral Given 01/25/23 1241)    IMPRESSION / MDM / ASSESSMENT AND PLAN / ED COURSE  I reviewed the triage vital signs and the nursing notes.                                Patient's presentation is most consistent with acute presentation with potential threat to life or bodily function.  Differential diagnosis includes, but is not limited to, presyncope due to vasovagal syncope, orthostatic hypotension, dehydration electrolyte disturbance, infection, thyroid dysfunction,  dysrhythmia, acs, CVA or intracranial bleeding, considered traumatic injuries but less likely given no significant trauma witnessed by wife who lowered him to the ground.   The patient is on the cardiac monitor to evaluate for evidence of arrhythmia and/or significant heart rate changes.  MDM: This is a patient who is back to his baseline who had a presyncopal/generalized weakness event this morning with no acute medical complaints  but history, physical is limited by his advanced dementia.  Check for infectious etiologies, electrolyte disturbances, thyroid dysfunction given his transient hypotension, mild bradycardia and hypothermia.  Check EKG and troponins for dysrhythmias or ACS.  Blood pressure has improved with some fluids, give IV crystalloid in the emergency department.  Will check CT angiogram given questionable right-sided weakness, he is outside of the stroke window given last known normal is last night for at bedtime.   If the above workup is within normal limits and vital signs normalized and are stable in the emergency department, and he is back to baseline ambulatory status, I think he can be discharged back home for outpatient follow-up-I discussed this plan with wife and she is in agreement.  CTA shows  Negative for large vessel occlusion.  But positive for Advanced Intracranial Atherosclerosis with  multifocal SEVERE Stenoses:  - both Vertebral Artery V4 segments AND the Basilar Artery. Basilar  tip remains patent in part due to fetal PCA origins.  - BILATERAL ACA origins.  - Right MCA M2 origins.   Consulted neurology for recs -symptoms likely to occur during hypotensive episodes, if no acute neurologic changes currently very low suspicion for stroke.  Will trial ambulation, repeat vital signs, and back to baseline will discharge.      FINAL CLINICAL IMPRESSION(S) / ED DIAGNOSES   Final diagnoses:  Near syncope  Vertebrobasilar artery stenosis     Rx / DC Orders    ED Discharge Orders     None        Note:  This document was prepared using Dragon voice recognition software and may include unintentional dictation errors.    Lucillie Garfinkel, MD 01/25/23 [phone]

## 2023-01-25 NOTE — ED Notes (Signed)
Lab called to state they will run troponin as an add on.

## 2023-01-25 NOTE — ED Notes (Signed)
Pt transferred to wheelchair and wheeled to front circle where wife picked him up.  He was able to transfer from chair to truck x1 assist. Pt in NAD, discharge papers given to wife.

## 2023-01-25 NOTE — Discharge Instructions (Signed)
Take aspirin daily as prescribed  Make sure to stay well-hydrated to prevent episodes of fainting in the future.  There is some narrowing in the blood vessels that lead to the brain that are sensitive to dehydration and may make you feel faint.  Thank you for choosing [place] for your health care today!  Please see your primary doctor this week for a follow up appointment.   Sometimes, in the early stages of certain disease courses it is difficult to detect in the emergency department evaluation -- so, it is important that you continue to monitor your symptoms and call your doctor right away or return to the emergency department if you develop any new or worsening symptoms.  Please go to the following website to schedule new (and existing) patient appointments:   [email or web address]  If you do not have a primary doctor try calling the following clinics to establish care:  If you have insurance:  [facility] [phone] [place] [address]   [facility]  [phone] [place] [place] [address]   If you do not have insurance:  [facility]  [phone] [address], [place] [address]   The following is another list of primary care offices in the area who are accepting new patients at this time.  Please reach out to one of them directly and let them know you would like to schedule an appointment to follow up on an Emergency Department visit, and/or to establish a new primary care provider (PCP).  There are likely other primary care clinics in the are who are accepting new patients, but this is an excellent place to start:  [place] physician: Dr Lavon Paganini [address] [place], [place] [address] [phone]  [facility] Lead Physician: Dr Steele Sizer [address], [place], [place] [address] [phone]  [place] Physician: Dr Park Liter [address] [place], [place] [address] [phone]  [facility] Lead Physician: Dr Dewaine Oats [place], [place], [place] [address] [phone]  [facility] Physician: Dr Halina Maidens [address] [place], [place] [address] [phone]   It was my pleasure to care for you today.   Hoover Brunette Jacelyn Grip, MD

## 2023-01-25 NOTE — ED Notes (Signed)
Per ct tech, pt pulled out left forearm iv while on ct table.

## 2023-01-25 NOTE — ED Triage Notes (Signed)
Arrives by [facility] for near syncopal episode.  Pt was standing at the sink and slowly began to slide down to the ground per caregiver, did not hit head or lose consciousness.  Pt has hx of dementia and is at baseline, has been complaining of back and leg pain for past few weeks. 94.4 oral temp, 80/56 initial bp.  EMS gave 273m NS, sbp at 120.

## 2023-01-25 NOTE — ED Notes (Signed)
Called lab for second set of cultures, confirmed with technician they will send someone.

## 2023-01-25 NOTE — ED Notes (Signed)
Pt was able to ambulate around room with assistance of wife and this rn. Pt returned to stretcher, in nad.

## 2023-01-30 LAB — CULTURE, BLOOD (ROUTINE X 2)
Culture: NO GROWTH
Culture: NO GROWTH
Special Requests: ADEQUATE
Special Requests: ADEQUATE

## 2023-02-05 ENCOUNTER — Ambulatory Visit: Payer: No Typology Code available for payment source | Admitting: Podiatry

## 2023-02-09 ENCOUNTER — Ambulatory Visit (INDEPENDENT_AMBULATORY_CARE_PROVIDER_SITE_OTHER): Payer: No Typology Code available for payment source | Admitting: Family Medicine

## 2023-02-09 VITALS — HR 81 | Temp 98.0°F

## 2023-02-09 DIAGNOSIS — R051 Acute cough: Secondary | ICD-10-CM | POA: Diagnosis not present

## 2023-02-09 LAB — POC COVID19 BINAXNOW: SARS Coronavirus 2 Ag: NEGATIVE

## 2023-02-09 MED ORDER — AMOXICILLIN 250 MG/5ML PO SUSR: 1000.0000 mg | Freq: Two times a day (BID) | ORAL | 0 refills | Status: AC

## 2023-02-09 NOTE — Progress Notes (Signed)
   Argentina Ponder De[place],acting as a scribe for Lelon Huh, MD.,have documented all relevant documentation on the behalf of Lelon Huh, MD,as directed by  Lelon Huh, MD while in the presence of Lelon Huh, MD.     Established patient visit   Patient: Luke [name]   DOB: 07-05-1946   77 y.o. Male  MRN: [id number] Visit Date: 02/09/2023  Today's healthcare provider: Lelon Huh, MD    Subjective    HPI  Patient is a 77 year old male who presents for evaluation of flu like symptoms.  Patient started having sinus congestion and runny nose on Friday and today is sounding hoarse.  Wife has tried to get him to take some Thera flu but patient has only taken a few swallows.  Medications: Outpatient Medications Prior to Visit  Medication Sig   amLODipine (NORVASC) 5 MG tablet Take 1 tablet (5 mg total) by mouth daily.   aspirin EC 81 MG tablet Take 1 tablet (81 mg total) by mouth daily. Swallow whole.   hydrochlorothiazide (HYDRODIURIL) 12.5 MG tablet TAKE 1 TABLET BY MOUTH ONCE DAILY   QUEtiapine (SEROQUEL) 25 MG tablet Take 25 mg by mouth at bedtime as needed. (Patient not taking: Reported on 10/30/2022)   QUEtiapine (SEROQUEL) 50 MG tablet Take 50 mg by mouth at bedtime.   sertraline (ZOLOFT) 100 MG tablet TAKE ONE TABLET BY MOUTH ONCE DAILY   No facility-administered medications prior to visit.    Review of Systems  Constitutional:  Positive for chills and fatigue. Negative for diaphoresis and fever.  HENT:  Positive for congestion, rhinorrhea, sneezing and voice change. Negative for ear discharge, ear pain, hearing loss, postnasal drip, sinus pressure, sinus pain, sore throat, tinnitus and trouble swallowing.   Eyes:  Positive for redness. Negative for pain, discharge and itching.  Respiratory:  Positive for cough. Negative for shortness of breath and wheezing.   Cardiovascular:  Negative for chest pain.  Gastrointestinal:  Negative for abdominal pain.  Neurological:   Positive for headaches.       Objective    Pulse 81   Temp 98 F (36.7 C) (Oral)   SpO2 98%    Physical Exam   General: Appearance:    Well developed, well nourished male in no acute distress  ENT:    Nasal passages congestion with yellow discharge and mild sinus tenderness   Lungs:     Clear to auscultation bilaterally, respirations unlabored  Heart:    Normal heart rate. Normal rhythm. No murmurs, rubs, or gallops.    MS:   All extremities are intact.    Neurologic:   Awake, alert, oriented x 3. No apparent focal neurological defect.       Covid - Negative  Assessment & Plan     1. Acute cough  - amoxicillin (AMOXIL) 250 MG/5ML suspension; Take 20 mLs (1,000 mg total) by mouth 2 (two) times daily for 7 days.  Dispense: 300 mL; Refill: 0       The entirety of the information documented in the History of Present Illness, Review of Systems and Physical Exam were personally obtained by me. Portions of this information were initially documented by the CMA and reviewed by me for thoroughness and accuracy.     Lelon Huh, MD  [place] [phone] (phone) [phone] (fax)  [place]

## 2023-02-12 ENCOUNTER — Ambulatory Visit: Payer: No Typology Code available for payment source | Admitting: Podiatry

## 2023-02-12 ENCOUNTER — Encounter: Payer: Self-pay | Admitting: Podiatry

## 2023-02-12 VITALS — BP 150/87 | HR 64

## 2023-02-12 DIAGNOSIS — M79674 Pain in right toe(s): Secondary | ICD-10-CM | POA: Diagnosis not present

## 2023-02-12 DIAGNOSIS — B351 Tinea unguium: Secondary | ICD-10-CM

## 2023-02-12 DIAGNOSIS — G309 Alzheimer's disease, unspecified: Secondary | ICD-10-CM | POA: Diagnosis not present

## 2023-02-12 DIAGNOSIS — F0150 Vascular dementia without behavioral disturbance: Secondary | ICD-10-CM | POA: Diagnosis not present

## 2023-02-12 DIAGNOSIS — M79675 Pain in left toe(s): Secondary | ICD-10-CM | POA: Diagnosis not present

## 2023-02-12 DIAGNOSIS — F0280 Dementia in other diseases classified elsewhere without behavioral disturbance: Secondary | ICD-10-CM

## 2023-02-12 NOTE — Progress Notes (Signed)
This patient presents to the office with chief complaint of long thick painful nails.  Patient says the nails are painful walking and wearing shoes.  This patient is unable to self treat.  This patient is unable to trim his nails since she is unable to reach his nails.  he presents to the office for preventative foot care services.  General Appearance  Alert, conversant and in no acute stress.  Vascular  Dorsalis pedis and posterior tibial  pulses are palpable  bilaterally.  Capillary return is within normal limits  bilaterally. Temperature is within normal limits  bilaterally.  Neurologic  Senn-Weinstein monofilament wire test within normal limits  bilaterally. Muscle power within normal limits bilaterally.  Nails Thick disfigured discolored nails with subungual debris  from hallux to fifth toes bilaterally. No evidence of bacterial infection or drainage bilaterally.  Black longitudinal streak left hallux.  Orthopedic  No limitations of motion  feet .  No crepitus or effusions noted.  No bony pathology or digital deformities noted.  Skin  normotropic skin with no porokeratosis noted bilaterally.  No signs of infections or ulcers noted.     Onychomycosis  Nails  B/L.  Pain in right toes  Pain in left toes  Debridement of nails both feet followed trimming the nails with dremel tool.   Streak has been present for years according to wife. RTC 3 months.   Gardiner Barefoot DPM

## 2023-03-04 ENCOUNTER — Ambulatory Visit (INDEPENDENT_AMBULATORY_CARE_PROVIDER_SITE_OTHER): Payer: No Typology Code available for payment source

## 2023-03-04 DIAGNOSIS — Z23 Encounter for immunization: Secondary | ICD-10-CM

## 2023-03-18 ENCOUNTER — Ambulatory Visit: Payer: No Typology Code available for payment source

## 2023-03-18 VITALS — Ht 72.0 in | Wt 200.0 lb

## 2023-03-18 DIAGNOSIS — Z0000 Encounter for general adult medical examination without abnormal findings: Secondary | ICD-10-CM | POA: Diagnosis not present

## 2023-03-18 NOTE — Patient Instructions (Signed)
Luke [name] , Thank you for taking time to come for your Medicare Wellness Visit. I appreciate your ongoing commitment to your health goals. Please review the following plan we discussed and let me know if I can assist you in the future.   These are the goals we discussed:  Goals      DIET - EAT MORE FRUITS AND VEGETABLES     DIET - INCREASE WATER INTAKE     Recommend increasing water intake to 4 glasses a day.         This is a list of the screening recommended for you and due dates:  Health Maintenance  Topic Date Due   Zoster (Shingles) Vaccine (1 of 2) Never done   DTaP/Tdap/Td vaccine (2 - Td or Tdap) 04/30/2020   COVID-19 Vaccine (4 - 2023-24 season) 08/29/2022   Medicare Annual Wellness Visit  03/17/2024   Colon Cancer Screening  06/12/2024   Pneumonia Vaccine  Completed   Flu Shot  Completed   Hepatitis C Screening: USPSTF Recommendation to screen - Ages 18-79 yo.  Completed   HPV Vaccine  Aged Out    Advanced directives: no  Conditions/risks identified: falls risk  Next appointment: Follow up in one year for your annual wellness visit. 03/22/2024@ 1pm telephone  Preventive Care 65 Years and Older, Male  Preventive care refers to lifestyle choices and visits with your health care provider that can promote health and wellness. What does preventive care include? A yearly physical exam. This is also called an annual well check. Dental exams once or twice a year. Routine eye exams. Ask your health care provider how often you should have your eyes checked. Personal lifestyle choices, including: Daily care of your teeth and gums. Regular physical activity. Eating a healthy diet. Avoiding tobacco and drug use. Limiting alcohol use. Practicing safe sex. Taking low doses of aspirin every day. Taking vitamin and mineral supplements as recommended by your health care provider. What happens during an annual well check? The services and screenings done by your health care  provider during your annual well check will depend on your age, overall health, lifestyle risk factors, and family history of disease. Counseling  Your health care provider may ask you questions about your: Alcohol use. Tobacco use. Drug use. Emotional well-being. Home and relationship well-being. Sexual activity. Eating habits. History of falls. Memory and ability to understand (cognition). Work and work Statistician. Screening  You may have the following tests or measurements: Height, weight, and BMI. Blood pressure. Lipid and cholesterol levels. These may be checked every 5 years, or more frequently if you are over 76 years old. Skin check. Lung cancer screening. You may have this screening every year starting at age 76 if you have a 30-pack-year history of smoking and currently smoke or have quit within the past 15 years. Fecal occult blood test (FOBT) of the stool. You may have this test every year starting at age 76. Flexible sigmoidoscopy or colonoscopy. You may have a sigmoidoscopy every 5 years or a colonoscopy every 10 years starting at age 76. Prostate cancer screening. Recommendations will vary depending on your family history and other risks. Hepatitis C blood test. Hepatitis B blood test. Sexually transmitted disease (STD) testing. Diabetes screening. This is done by checking your blood sugar (glucose) after you have not eaten for a while (fasting). You may have this done every 1-3 years. Abdominal aortic aneurysm (AAA) screening. You may need this if you are a current or former smoker.  Osteoporosis. You may be screened starting at age 76 if you are at high risk. Talk with your health care provider about your test results, treatment options, and if necessary, the need for more tests. Vaccines  Your health care provider may recommend certain vaccines, such as: Influenza vaccine. This is recommended every year. Tetanus, diphtheria, and acellular pertussis (Tdap, Td)  vaccine. You may need a Td booster every 10 years. Zoster vaccine. You may need this after age 65. Pneumococcal 13-valent conjugate (PCV13) vaccine. One dose is recommended after age 16. Pneumococcal polysaccharide (PPSV23) vaccine. One dose is recommended after age 76. Talk to your health care provider about which screenings and vaccines you need and how often you need them. This information is not intended to replace advice given to you by your health care provider. Make sure you discuss any questions you have with your health care provider. Document Released: 01/11/2016 Document Revised: 09/03/2016 Document Reviewed: 10/16/2015 Elsevier Interactive Patient Education  2017 [place] Prevention in the Home Falls can cause injuries. They can happen to people of all ages. There are many things you can do to make your home safe and to help prevent falls. What can I do on the outside of my home? Regularly fix the edges of walkways and driveways and fix any cracks. Remove anything that might make you trip as you walk through a door, such as a raised step or threshold. Trim any bushes or trees on the path to your home. Use bright outdoor lighting. Clear any walking paths of anything that might make someone trip, such as rocks or tools. Regularly check to see if handrails are loose or broken. Make sure that both sides of any steps have handrails. Any raised decks and porches should have guardrails on the edges. Have any leaves, snow, or ice cleared regularly. Use sand or salt on walking paths during winter. Clean up any spills in your garage right away. This includes oil or grease spills. What can I do in the bathroom? Use night lights. Install grab bars by the toilet and in the tub and shower. Do not use towel bars as grab bars. Use non-skid mats or decals in the tub or shower. If you need to sit down in the shower, use a plastic, non-slip stool. Keep the floor dry. Clean up any  water that spills on the floor as soon as it happens. Remove soap buildup in the tub or shower regularly. Attach bath mats securely with double-sided non-slip rug tape. Do not have throw rugs and other things on the floor that can make you trip. What can I do in the bedroom? Use night lights. Make sure that you have a light by your bed that is easy to reach. Do not use any sheets or blankets that are too big for your bed. They should not hang down onto the floor. Have a firm chair that has side arms. You can use this for support while you get dressed. Do not have throw rugs and other things on the floor that can make you trip. What can I do in the kitchen? Clean up any spills right away. Avoid walking on wet floors. Keep items that you use a lot in easy-to-reach places. If you need to reach something above you, use a strong step stool that has a grab bar. Keep electrical cords out of the way. Do not use floor polish or wax that makes floors slippery. If you must use wax, use non-skid floor  wax. Do not have throw rugs and other things on the floor that can make you trip. What can I do with my stairs? Do not leave any items on the stairs. Make sure that there are handrails on both sides of the stairs and use them. Fix handrails that are broken or loose. Make sure that handrails are as long as the stairways. Check any carpeting to make sure that it is firmly attached to the stairs. Fix any carpet that is loose or worn. Avoid having throw rugs at the top or bottom of the stairs. If you do have throw rugs, attach them to the floor with carpet tape. Make sure that you have a light switch at the top of the stairs and the bottom of the stairs. If you do not have them, ask someone to add them for you. What else can I do to help prevent falls? Wear shoes that: Do not have high heels. Have rubber bottoms. Are comfortable and fit you well. Are closed at the toe. Do not wear sandals. If you use a  stepladder: Make sure that it is fully opened. Do not climb a closed stepladder. Make sure that both sides of the stepladder are locked into place. Ask someone to hold it for you, if possible. Clearly mark and make sure that you can see: Any grab bars or handrails. First and last steps. Where the edge of each step is. Use tools that help you move around (mobility aids) if they are needed. These include: Canes. Walkers. Scooters. Crutches. Turn on the lights when you go into a dark area. Replace any light bulbs as soon as they burn out. Set up your furniture so you have a clear path. Avoid moving your furniture around. If any of your floors are uneven, fix them. If there are any pets around you, be aware of where they are. Review your medicines with your doctor. Some medicines can make you feel dizzy. This can increase your chance of falling. Ask your doctor what other things that you can do to help prevent falls. This information is not intended to replace advice given to you by your health care provider. Make sure you discuss any questions you have with your health care provider. Document Released: 10/11/2009 Document Revised: 05/22/2016 Document Reviewed: 01/19/2015 Elsevier Interactive Patient Education  2017 [employer].

## 2023-03-18 NOTE — Progress Notes (Signed)
I connected with  Luke [name] and his wife Luke [name] on 03/18/23 by a telephone/speaker and verified through wife/caregiver that I am speaking with the correct person using two identifiers.  Patient Location: Home  Provider Location: Office/Clinic  I discussed the limitations of evaluation and management by telemedicine. The patient expressed understanding and agreed to proceed.  Subjective:   Luke [name] is a 77 y.o. male who presents for Medicare Annual/Subsequent preventive examination.  Review of Systems   Cardiac Risk Factors include: advanced age (>41men, >39 women);dyslipidemia;hypertension;male gender;sedentary lifestyle    Objective:    Today's Vitals   03/18/23 1327  Weight: 200 lb (90.7 kg)  Height: 6' (1.829 m)   Body mass index is 27.12 kg/m.     03/18/2023    1:37 PM 01/25/2023   10:59 AM 03/13/2022    2:26 PM 09/11/2020    1:28 PM 04/02/2020    4:42 PM 07/18/2019    9:36 AM 06/13/2019    9:45 AM  Advanced Directives  Does Patient Have a Medical Advance Directive? No No No No No No Yes  Type of Advance Directive       Living will;Healthcare Power of Attorney  Does patient want to make changes to medical advance directive?       No - Patient declined  Copy of [place] in Chart?       No - copy requested  Would patient like information on creating a medical advance directive?   No - Patient declined No - Patient declined No - Patient declined Yes (MAU/Ambulatory/Procedural Areas - Information given)     Current Medications (verified) Outpatient Encounter Medications as of 03/18/2023  Medication Sig   amLODipine (NORVASC) 5 MG tablet Take 1 tablet (5 mg total) by mouth daily.   aspirin EC 81 MG tablet Take 1 tablet (81 mg total) by mouth daily. Swallow whole.   hydrochlorothiazide (HYDRODIURIL) 12.5 MG tablet TAKE 1 TABLET BY MOUTH ONCE DAILY   QUEtiapine (SEROQUEL) 25 MG tablet Take 25 mg by mouth at bedtime as needed.   QUEtiapine  (SEROQUEL) 50 MG tablet Take 50 mg by mouth at bedtime.   sertraline (ZOLOFT) 100 MG tablet TAKE ONE TABLET BY MOUTH ONCE DAILY   No facility-administered encounter medications on file as of 03/18/2023.    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   Dysphagia 04/02/2009   GERD (gastroesophageal reflux disease) 02/06/2016   Schatski ring seen on EGD 07-10-11    History of MRSA infection 11/18/2007   Hyperlipidemia, mixed 12/29/1998   Hypertension 04/02/2009   Hypogonadism male 02/06/2016   Insomnia 02/06/2016   LBP (low back pain) 07/13/2013   Numbness of toes 02/06/2016   Palpitations 02/06/2016   Prediabetes 02/08/2016   A1c=6.2 02/08/16    Prostate cancer ([place]) 2014   Seed Implants   Single kidney    Vitamin D deficiency 02/06/2016   Past Surgical History:  Procedure Laterality Date   ABI  07/09/2011   Normal; Left= 1.21, Right=1.00. Triphasic waver forms   BALLOON DILATION N/A 06/13/2019   Procedure: BALLOON DILATION;  Surgeon: Lucilla Lame, MD;  Location: [facility];  Service: Endoscopy;  Laterality: N/A;   Carotid Doppler Ultrasound  10/18/2012   Minimal soft plague both carotids. No hemodynamically significant stenosis   CHOLECYSTECTOMY  2001   COLONOSCOPY WITH PROPOFOL N/A 06/13/2019   Procedure: COLONOSCOPY WITH BIOPSIES;  Surgeon: Lucilla Lame, MD;  Location: [facility];  Service: Endoscopy;  Laterality: N/A;   ESOPHAGOGASTRODUODENOSCOPY (EGD) WITH PROPOFOL N/A 06/13/2019   Procedure: ESOPHAGOGASTRODUODENOSCOPY (EGD) WITH BALLOON DILATION;  Surgeon: Lucilla Lame, MD;  Location: [facility];  Service: Endoscopy;  Laterality: N/A;   Lumbar Spine X Ray  11/28/2011   Mild DDD L1- L2. Anterior heigt loss of T12-L1 vertebral bodies   NEPHRECTOMY Right 2001   done by Dr. Eliberto Ivory for  Angiomyolipoma   PENILE PROSTHESIS  REMOVAL  11/28/2013   Dr. Eliberto Ivory   PENILE PROSTHESIS IMPLANT  2008   Photovaporization of prostate with green light laser  01/19/2012   Dr. Rogers Blocker   POLYPECTOMY N/A 06/13/2019   Procedure: POLYPECTOMY;  Surgeon: Lucilla Lame, MD;  Location: [facility];  Service: Endoscopy;  Laterality: N/A;   SPINE SURGERY  1991   UPPER GI ENDOSCOPY  07/10/2011   Done by Dr. Sharen Counter. Revealed Schatski rings   Family History  Problem Relation Age of Onset   Heart attack Mother    Hypertension Father    Prostate cancer Neg Hx    Bladder Cancer Neg Hx    Kidney cancer Neg Hx    Social History   Socioeconomic History   Marital status: Married    Spouse name: Not on file   Number of children: 2   Years of education: Not on file   Highest education level: 10th grade  Occupational History   Occupation: Retired  Tobacco Use   Smoking status: Never   Smokeless tobacco: Never   Tobacco comments:    smokes a Lexicographer with friends  Vaping Use   Vaping Use: Never used  Substance and Sexual Activity   Alcohol use: Not Currently    Comment: occasional / monthly   Drug use: No   Sexual activity: Not Currently  Other Topics Concern   Not on file  Social History Narrative   Not on file   Social Determinants of Health   Financial Resource Strain: Low Risk  (03/18/2023)   Overall Financial Resource Strain (CARDIA)    Difficulty of Paying Living Expenses: Not hard at all  Food Insecurity: No Food Insecurity (03/18/2023)   Hunger Vital Sign    Worried About Running Out of Food in the Last Year: Never true    [place] in the Last Year: Never true  Transportation Needs: No Transportation Needs (03/18/2023)   PRAPARE - Hydrologist (Medical): No    Lack of Transportation (Non-Medical): No  Physical Activity: Inactive (03/18/2023)   Exercise Vital Sign    Days of Exercise per Week: 0 days    Minutes of Exercise  per Session: 0 min  Stress: No Stress Concern Present (03/18/2023)   [place]    Feeling of Stress : Not at all  Social Connections: Socially Isolated (03/18/2023)   Social Connection and Isolation Panel [NHANES]    Frequency of Communication with Friends and Family: Never    Frequency of Social Gatherings with Friends and Family: Twice a week    Attends Religious Services: Never    Marine scientist or Organizations: No    Attends Archivist Meetings: Never    Marital Status: Married    Tobacco Counseling Counseling given: Not Answered Tobacco comments: smokes a Lexicographer with friends  Clinical Intake:  Pre-visit preparation completed: Yes  Pain : No/denies pain     BMI - recorded: 27.12 Nutritional Status: BMI 25 -29 Overweight Nutritional Risks: None Diabetes: No  How often do you need to have someone help you when you read instructions, pamphlets, or other written materials from your doctor or pharmacy?: 1 - Never  Diabetic?no  Interpreter Needed?: No  Comments: lives w/wife and granddaughter Information entered by :: B.Abi Shoults,LPN   Activities of Daily Living    03/18/2023    1:38 PM 02/09/2023    8:48 AM  In your present state of health, do you have any difficulty performing the following activities:  Hearing? 1 1  Vision? 0 1  Difficulty concentrating or making decisions? 1 1  Walking or climbing stairs? 1 1  Dressing or bathing? 1 1  Doing errands, shopping? 1 1  Preparing Food and eating ? Y   Using the Toilet? Y   In the past six months, have you accidently leaked urine? Y   Do you have problems with loss of bowel control? Y   Managing your Medications? Y   Managing your Finances? Y   Housekeeping or managing your Housekeeping? Y     Patient Care Team: Birdie Sons, MD as PCP - General (Family Medicine) Abbie Sons, MD as Consulting  Physician (Urology) Lucilla Lame, MD as Consulting Physician (Gastroenterology) [facility], [place] (Optometry) Vladimir Crofts, MD as Consulting Physician (Neurology)  Indicate any recent Medical Services you may have received from other than [facility] providers in the past year (date may be approximate).     Assessment:   This is a routine wellness examination for Adrian.  Hearing/Vision screen Hearing Screening - Comments:: Adequate hearing Vision Screening - Comments:: Adequate vision;only readers [facility]  Dietary issues and exercise activities discussed: Current Exercise Habits: The patient does not participate in regular exercise at present, Exercise limited by: neurologic condition(s);orthopedic condition(s)   Goals Addressed   None    Depression Screen    03/18/2023    1:33 PM 02/09/2023    8:48 AM 03/13/2022    2:25 PM 10/15/2020    3:04 PM 09/11/2020    1:25 PM 08/03/2020    1:49 PM 07/18/2019    9:36 AM  PHQ 2/9 Scores  PHQ - 2 Score 0  0 0 1 0 2  PHQ- 9 Score       5  Exception Documentation  Medical reason         Fall Risk    03/18/2023    1:31 PM 02/09/2023    8:48 AM 03/13/2022    2:27 PM 10/15/2020    3:04 PM 09/11/2020    1:29 PM  [place] in the past year? 1 1 0 0 0  Number falls in past yr: 0 0 0 0 0  Injury with Fall? 0 0 0 0 0  Risk for fall due to : No Fall Risks  No Fall Risks    Follow up Education provided;Falls prevention discussed  Falls evaluation completed      FALL RISK PREVENTION PERTAINING TO THE HOME:  Any stairs in or around the home? No  If so, are there any without handrails? No  Home free of loose throw rugs in walkways, pet beds, electrical cords, etc? Yes  Adequate lighting in your home to reduce risk of falls? Yes   ASSISTIVE DEVICES UTILIZED TO PREVENT FALLS:  Life alert? No  Use  of a cane, walker or w/c? No  Grab bars in the bathroom? Yes  Shower chair or bench in shower? Yes  Elevated toilet seat or a  handicapped toilet? Yes   Cognitive Function:        05/12/2017    9:45 AM  6CIT Screen  What Year? 4 points  What month? 3 points  What time? 0 points  Count back from 20 0 points  Months in reverse 4 points  Repeat phrase 10 points  Total Score 21 points    Immunizations Immunization History  Administered Date(s) Administered   Fluad Quad(high Dose 65+) 10/03/2019, 09/12/2020, 03/21/2022, 03/04/2023   Influenza, High Dose Seasonal PF 11/30/2015, 09/09/2016, 09/01/2017, 09/17/2018   Influenza-Unspecified 11/28/2013   Moderna Sars-Covid-2 Vaccination 11/26/2020   PFIZER(Purple Top)SARS-COV-2 Vaccination 02/07/2020, 02/28/2020   Pneumococcal Conjugate-13 02/07/2016   Pneumococcal Polysaccharide-23 04/30/2010, 05/12/2017   Tdap 04/30/2010    TDAP status: Up to date  Flu Vaccine status: Up to date  Pneumococcal vaccine status: Up to date  Covid-19 vaccine status: Completed vaccines  Qualifies for Shingles Vaccine? YES  Zostavax completed No   Shingrix Completed?: No.    Education has been provided regarding the importance of this vaccine. Patient has been advised to call insurance company to determine out of pocket expense if they have not yet received this vaccine. Advised may also receive vaccine at local pharmacy or Health Dept. Verbalized acceptance and understanding.  Screening Tests Health Maintenance  Topic Date Due   Zoster Vaccines- Shingrix (1 of 2) Never done   DTaP/Tdap/Td (2 - Td or Tdap) 04/30/2020   COVID-19 Vaccine (4 - 2023-24 season) 08/29/2022   Medicare Annual Wellness (AWV)  03/17/2024   COLONOSCOPY (Pts 45-21yrs Insurance coverage will need to be confirmed)  06/12/2024   Pneumonia Vaccine 35+ Years old  Completed   INFLUENZA VACCINE  Completed   Hepatitis C Screening  Completed   HPV VACCINES  Aged Out    Health Maintenance  Health Maintenance Due  Topic Date Due   Zoster Vaccines- Shingrix (1 of 2) Never done   DTaP/Tdap/Td (2 - Td or  Tdap) 04/30/2020   COVID-19 Vaccine (4 - 2023-24 season) 08/29/2022    Colorectal cancer screening: No longer required.   Lung Cancer Screening: (Low Dose CT Chest recommended if Age 69-80 years, 30 pack-year currently smoking OR have quit w/in 15years.) does not qualify.   Lung Cancer Screening Referral: no  Additional Screening:  Hepatitis C Screening: does not qualify; Completed yes  Vision Screening: Recommended annual ophthalmology exams for early detection of glaucoma and other disorders of the eye. Is the patient up to date with their annual eye exam?  Yes  Who is the provider or what is the name of the office in which the patient attends annual eye exams? [facility] If pt is not established with a provider, would they like to be referred to a provider to establish care? No .   Dental Screening: Recommended annual dental exams for proper oral hygiene  Community Resource Referral / Chronic Care Management: CRR required this visit?  No   CCM required this visit?  No      Plan:     I have personally reviewed and noted the following in the patient's chart:   Medical and social history Use of alcohol, tobacco or illicit drugs  Current medications and supplements including opioid prescriptions. Patient is not currently taking opioid prescriptions. Functional ability and status Nutritional status Physical activity Advanced directives  List of other physicians Hospitalizations, surgeries, and ER visits in previous 12 months Vitals Screenings to include cognitive, depression, and falls Referrals and appointments  In addition, I have reviewed and discussed with patient certain preventive protocols, quality metrics, and best practice recommendations. A written personalized care plan for preventive services as well as general preventive health recommendations were provided to patient.     Roger Shelter, LPN   [id number]   Nurse Notes: I spoke with pt's wife Luke [name]  and pt on speaker phone. Pt mumbles a few times but does not answer questions. Information provided by his wife/caretaker. She relays pt maintains at his baseline. She has one concern that pt will not swallow drinks she is giving him sometimes, that he just holds it in his mouth. She inquires if anything she can do to facilitate him swallowing. She relays he has/does not get choked as she watches for such.She voices no more concerns or questions.

## 2023-04-12 ENCOUNTER — Other Ambulatory Visit: Payer: Self-pay | Admitting: Family Medicine

## 2023-04-12 DIAGNOSIS — I10 Essential (primary) hypertension: Secondary | ICD-10-CM

## 2023-06-10 DIAGNOSIS — F0280 Dementia in other diseases classified elsewhere without behavioral disturbance: Secondary | ICD-10-CM | POA: Diagnosis not present

## 2023-06-10 DIAGNOSIS — G479 Sleep disorder, unspecified: Secondary | ICD-10-CM | POA: Diagnosis not present

## 2023-06-10 DIAGNOSIS — F0150 Vascular dementia without behavioral disturbance: Secondary | ICD-10-CM | POA: Diagnosis not present

## 2023-06-10 DIAGNOSIS — R634 Abnormal weight loss: Secondary | ICD-10-CM | POA: Diagnosis not present

## 2023-06-10 DIAGNOSIS — G309 Alzheimer's disease, unspecified: Secondary | ICD-10-CM | POA: Diagnosis not present

## 2023-06-10 DIAGNOSIS — E538 Deficiency of other specified B group vitamins: Secondary | ICD-10-CM | POA: Diagnosis not present

## 2023-06-11 ENCOUNTER — Encounter: Payer: Self-pay | Admitting: Podiatry

## 2023-06-11 ENCOUNTER — Ambulatory Visit: Payer: No Typology Code available for payment source | Admitting: Podiatry

## 2023-06-11 VITALS — BP 152/121 | HR 68

## 2023-06-11 DIAGNOSIS — M79675 Pain in left toe(s): Secondary | ICD-10-CM | POA: Diagnosis not present

## 2023-06-11 DIAGNOSIS — B351 Tinea unguium: Secondary | ICD-10-CM

## 2023-06-11 DIAGNOSIS — M79674 Pain in right toe(s): Secondary | ICD-10-CM | POA: Diagnosis not present

## 2023-06-11 NOTE — Progress Notes (Signed)
  Subjective:  Patient ID: Luke [name], male    DOB: November 21, 1946,  MRN: [id number]  Luke [name] presents to clinic today for painful elongated mycotic toenails 1-5 bilaterally which are tender when wearing enclosed shoe gear. Pain is relieved with periodic professional debridement. Patient has h/o Alzheimer's. He is accompanied by wife who assists with visit on today's visit. Chief Complaint  Patient presents with   Nail Problem    "He has an ingrown toenail on his right big toe and he needs his nails clipped."   New problem(s): None.   PCP is Malva Limes, MD.  Allergies  Allergen Reactions   Donepezil     Hallucinations    Review of Systems: Negative except as noted in the HPI. Objective:   Vitals:   06/11/23 1553  BP: (!) 152/121  Pulse: 68   Constitutional Luke [name] is a pleasant 77 y.o. male, in NAD. AAO x 2  Vascular Vascular Examination: Capillary refill time immediate b/l. Vascular status intact b/l with palpable pedal pulses. Pedal hair present b/l. No edema. No pain with calf compression b/l. Skin temperature gradient WNL b/l. No cyanosis or clubbing b/l.   Neurological Examination: Sensation grossly intact b/l with 10 gram monofilament. Vibratory sensation intact b/l.   Dermatological Examination: Pedal skin with normal turgor, texture and tone b/l.  No open wounds. No interdigital macerations.   Toenails 1-5 b/l thick, discolored, elongated with subungual debris and pain on dorsal palpation.   Central band melanonychia left hallux with negative Hutchinson's sign. Wife states this has been present husband's entire life.  Musculoskeletal Examination: Normal muscle strength 5/5 to all lower extremity muscle groups bilaterally. No pain, crepitus or joint limitation noted with ROM b/l LE. No gross bony pedal deformities b/l. Patient ambulates independently without assistive aids.  Radiographs: None   Assessment:   1. Pain due to onychomycosis  of toenails of both feet    Plan:  -Patient with h/o dementia/Alzheimer's/cognitive deficit. Patient's family member present. All questions/concerns addressed on today's visit. -Consent given for treatment as described below: -Patient to continue soft, supportive shoe gear daily. -Toenails 1-5 b/l were debrided in length and girth with sterile nail nippers and dremel without iatrogenic bleeding.  -Patient/POA to call should there be question/concern in the interim.  Return in about 3 months (around 09/11/2023).  Freddie Breech, DPM

## 2023-07-08 ENCOUNTER — Other Ambulatory Visit: Payer: Self-pay | Admitting: Family Medicine

## 2023-07-17 DIAGNOSIS — G309 Alzheimer's disease, unspecified: Secondary | ICD-10-CM | POA: Diagnosis not present

## 2023-07-17 DIAGNOSIS — R2681 Unsteadiness on feet: Secondary | ICD-10-CM | POA: Diagnosis not present

## 2023-07-17 DIAGNOSIS — F0280 Dementia in other diseases classified elsewhere without behavioral disturbance: Secondary | ICD-10-CM | POA: Diagnosis not present

## 2023-08-17 DIAGNOSIS — F0280 Dementia in other diseases classified elsewhere without behavioral disturbance: Secondary | ICD-10-CM | POA: Diagnosis not present

## 2023-08-17 DIAGNOSIS — G309 Alzheimer's disease, unspecified: Secondary | ICD-10-CM | POA: Diagnosis not present

## 2023-08-17 DIAGNOSIS — R2681 Unsteadiness on feet: Secondary | ICD-10-CM | POA: Diagnosis not present

## 2023-09-17 DIAGNOSIS — R2681 Unsteadiness on feet: Secondary | ICD-10-CM | POA: Diagnosis not present

## 2023-09-17 DIAGNOSIS — F0280 Dementia in other diseases classified elsewhere without behavioral disturbance: Secondary | ICD-10-CM | POA: Diagnosis not present

## 2023-09-17 DIAGNOSIS — G309 Alzheimer's disease, unspecified: Secondary | ICD-10-CM | POA: Diagnosis not present

## 2023-09-21 ENCOUNTER — Ambulatory Visit: Payer: No Typology Code available for payment source | Admitting: Podiatry

## 2023-09-21 ENCOUNTER — Encounter: Payer: Self-pay | Admitting: Podiatry

## 2023-09-21 VITALS — BP 170/100 | HR 56

## 2023-09-21 DIAGNOSIS — B351 Tinea unguium: Secondary | ICD-10-CM

## 2023-09-21 DIAGNOSIS — M79674 Pain in right toe(s): Secondary | ICD-10-CM

## 2023-09-21 DIAGNOSIS — M79675 Pain in left toe(s): Secondary | ICD-10-CM | POA: Diagnosis not present

## 2023-09-22 NOTE — Progress Notes (Signed)
Subjective:  Patient ID: Luke [name], male    DOB: 04/04/1946,  MRN: [id number]  77 y.o. male presents to clinic today with painful thick toenails that are difficult to trim. Pain interferes with ambulation. Aggravating factors include wearing enclosed shoe gear. Pain is relieved with periodic professional debridement. Chief Complaint  Patient presents with   Nail Problem    "He needs his toenails clipped.  Check the ingrown toenail."    Patient has h/o dementia and is accompanied by his wife on today's visit.  New pedal problem(s): None   PCP is Malva Limes, MD , and last visit was February, 2024.  Allergies  Allergen Reactions   Donepezil     Hallucinations    Review of Systems: Negative except as noted in the HPI.   Objective:  DEVONDRE ACKERMAN is a pleasant 77 y.o. male WD, WN in NAD.Marland Kitchen   Vascular Examination: Capillary refill time immediate b/l. Vascular status intact b/l with palpable pedal pulses. Pedal hair present b/l. No edema. No pain with calf compression b/l. Skin temperature gradient WNL b/l. No cyanosis or clubbing b/l.   Neurological Examination: Sensation grossly intact b/l with 10 gram monofilament. Vibratory sensation intact b/l.   Dermatological Examination: Pedal skin with normal turgor, texture and tone b/l.  No open wounds. No interdigital macerations.   Toenails 1-5 b/l thick, discolored, elongated with subungual debris and pain on dorsal palpation.   Central band melanonychia left hallux with negative Hutchinson's sign. Wife states this has been present husband's entire life.  Musculoskeletal Examination: Normal muscle strength 5/5 to all lower extremity muscle groups bilaterally. No pain, crepitus or joint limitation noted with ROM b/l LE. No gross bony pedal deformities b/l. Patient ambulates independently without assistive aids.  Radiographs: None  Assessment:   1. Pain due to onychomycosis of toenails of both feet    Plan:   -Patient's family member present. All questions/concerns addressed on today's visit. -Consent given for treatment as described below: -Examined patient. -Mycotic toenails 1-5 bilaterally were debrided in length and girth with sterile nail nippers and dremel without incident. -Patient/POA to call should there be question/concern in the interim.  Return in about 3 months (around 12/21/2023).  Freddie Breech, DPM

## 2023-09-24 DIAGNOSIS — Z008 Encounter for other general examination: Secondary | ICD-10-CM | POA: Diagnosis not present

## 2023-09-24 DIAGNOSIS — G309 Alzheimer's disease, unspecified: Secondary | ICD-10-CM | POA: Diagnosis not present

## 2023-09-24 DIAGNOSIS — Z6821 Body mass index (BMI) 21.0-21.9, adult: Secondary | ICD-10-CM | POA: Diagnosis not present

## 2023-09-24 DIAGNOSIS — I700 Atherosclerosis of aorta: Secondary | ICD-10-CM | POA: Diagnosis not present

## 2023-09-24 DIAGNOSIS — R2681 Unsteadiness on feet: Secondary | ICD-10-CM | POA: Diagnosis not present

## 2023-10-06 ENCOUNTER — Other Ambulatory Visit: Payer: Self-pay | Admitting: Family Medicine

## 2023-10-06 DIAGNOSIS — F410 Panic disorder [episodic paroxysmal anxiety] without agoraphobia: Secondary | ICD-10-CM

## 2023-10-12 ENCOUNTER — Encounter: Payer: Self-pay | Admitting: Family Medicine

## 2023-10-12 ENCOUNTER — Ambulatory Visit (INDEPENDENT_AMBULATORY_CARE_PROVIDER_SITE_OTHER): Payer: No Typology Code available for payment source | Admitting: Family Medicine

## 2023-10-12 VITALS — HR 63 | Temp 98.4°F | Ht 72.0 in

## 2023-10-12 DIAGNOSIS — R7303 Prediabetes: Secondary | ICD-10-CM

## 2023-10-12 DIAGNOSIS — I10 Essential (primary) hypertension: Secondary | ICD-10-CM

## 2023-10-12 DIAGNOSIS — F0280 Dementia in other diseases classified elsewhere without behavioral disturbance: Secondary | ICD-10-CM | POA: Diagnosis not present

## 2023-10-12 DIAGNOSIS — E559 Vitamin D deficiency, unspecified: Secondary | ICD-10-CM

## 2023-10-12 DIAGNOSIS — G309 Alzheimer's disease, unspecified: Secondary | ICD-10-CM | POA: Diagnosis not present

## 2023-10-12 DIAGNOSIS — E782 Mixed hyperlipidemia: Secondary | ICD-10-CM

## 2023-10-12 DIAGNOSIS — F0150 Vascular dementia without behavioral disturbance: Secondary | ICD-10-CM | POA: Diagnosis not present

## 2023-10-12 NOTE — Progress Notes (Signed)
Established patient visit   Patient: Luke [name]   DOB: [birth year]   77 y.o. Male  MRN: [id number] Visit Date: 10/12/2023  Today's healthcare provider: Mila Merry, MD   Chief Complaint  Patient presents with   office visit    Patient needs labs.   Subjective    Discussed the use of AI scribe software for clinical note transcription with the patient, who gave verbal consent to proceed.  History of Present Illness   The patient, with a history of severe Alzheimer's' reports for routine follow up of hypertension. His wife reports he has been experiencing weight loss over the past year which they wer etold by his neurologist is to be expected. Despite maintaining a good appetite, the weight loss has been significant, as noted during a visit to the neurologist in June. The patient's blood pressure has been fluctuating, with a recent high reading at the podiatrist's office, although home readings have been lower. The patient is currently on amlodipine and HCTZ for hypertension management.  In addition to these concerns, the patient has been dealing with an ingrown toenail, for which he has been seeing a podiatrist every three months. The patient's mood varies, with some days being calm and others marked by anxiety, which was particularly noticeable during a recent attempt to get him into the car for a visit to the clinic. Despite these health issues, the patient's heart and lung sounds have been reported as normal.       Medications: Outpatient Medications Prior to Visit  Medication Sig   amLODipine (NORVASC) 5 MG tablet TAKE 1 TABLET(5 MG) BY MOUTH DAILY   aspirin EC 81 MG tablet Take 1 tablet (81 mg total) by mouth daily. Swallow whole.   hydrochlorothiazide (HYDRODIURIL) 12.5 MG tablet TAKE 1 TABLET(12.5 MG) BY MOUTH DAILY   QUEtiapine (SEROQUEL) 25 MG tablet Take 25 mg by mouth at bedtime as needed.   QUEtiapine (SEROQUEL) 50 MG tablet Take 50 mg by mouth at bedtime.    sertraline (ZOLOFT) 100 MG tablet TAKE 1 TABLET BY MOUTH EVERY DAY   No facility-administered medications prior to visit.   Review of Systems     Objective    Pulse 63   Temp 98.4 F (36.9 C) (Oral)   Ht 6' (1.829 m)   SpO2 98%   BMI 27.12 kg/m   Physical Exam   Awake, alert. Does not respond to questions. Slightly agitated. Refused to allow weight or blood pressure to be taken.  Chest CTA Heart RRR without murmurs.  No edema.    Assessment & Plan        Alzheimer's dementia Significant weight loss noted over the past year despite good appetite. Neurologist aware and stated this was to be expected. -Continue current care plan as per neurologist's recommendations.  Hypertension Blood pressure was noted to be high at a recent podiatry appointment but has been normal at home and at other doctor's visits. Patient is currently on Amlodipine and HCTZ. -Continue current antihypertensive regimen. -Monitor blood pressure at home.  Patient has an ingrown toenail and is being seen by a podiatrist every three months due to borderline diabetes. -Continue with current podiatry care plan.         Mila Merry, MD  [facility] [phone] (phone) [phone] (fax)  [facility]

## 2023-10-13 DIAGNOSIS — R7303 Prediabetes: Secondary | ICD-10-CM | POA: Diagnosis not present

## 2023-10-13 DIAGNOSIS — E559 Vitamin D deficiency, unspecified: Secondary | ICD-10-CM | POA: Diagnosis not present

## 2023-10-13 DIAGNOSIS — E782 Mixed hyperlipidemia: Secondary | ICD-10-CM | POA: Diagnosis not present

## 2023-10-14 LAB — COMPREHENSIVE METABOLIC PANEL
ALT: 10 [IU]/L (ref 0–44)
AST: 14 [IU]/L (ref 0–40)
Albumin: 4.3 g/dL (ref 3.8–4.8)
Alkaline Phosphatase: 82 [IU]/L (ref 44–121)
BUN/Creatinine Ratio: 13 (ref 10–24)
BUN: 15 mg/dL (ref 8–27)
Bilirubin Total: 0.5 mg/dL (ref 0.0–1.2)
CO2: 26 mmol/L (ref 20–29)
Calcium: 9.8 mg/dL (ref 8.6–10.2)
Chloride: 105 mmol/L (ref 96–106)
Creatinine, Ser: 1.18 mg/dL (ref 0.76–1.27)
Globulin, Total: 2.6 g/dL (ref 1.5–4.5)
Glucose: 60 mg/dL — ABNORMAL LOW (ref 70–99)
Potassium: 3.5 mmol/L (ref 3.5–5.2)
Sodium: 147 mmol/L — ABNORMAL HIGH (ref 134–144)
Total Protein: 6.9 g/dL (ref 6.0–8.5)
eGFR: 64 mL/min/{1.73_m2} (ref >59)

## 2023-10-14 LAB — CBC
Hematocrit: 42.8 % (ref 37.5–51.0)
Hemoglobin: 13.3 g/dL (ref 13.0–17.7)
MCH: 21.8 pg — ABNORMAL LOW (ref 26.6–33.0)
MCHC: 31.1 g/dL — ABNORMAL LOW (ref 31.5–35.7)
MCV: 70 fL — ABNORMAL LOW (ref 79–97)
Platelets: 251 10*3/uL (ref 150–450)
RBC: 6.09 x10E6/uL — ABNORMAL HIGH (ref 4.14–5.80)
RDW: 18.1 % — ABNORMAL HIGH (ref 11.6–15.4)
WBC: 5.4 10*3/uL (ref 3.4–10.8)

## 2023-10-14 LAB — LIPID PANEL
Chol/HDL Ratio: 3.5 {ratio} (ref 0.0–5.0)
Cholesterol, Total: 173 mg/dL (ref 100–199)
HDL: 50 mg/dL (ref >39)
LDL Chol Calc (NIH): 99 mg/dL (ref 0–99)
Triglycerides: 137 mg/dL (ref 0–149)
VLDL Cholesterol Cal: 24 mg/dL (ref 5–40)

## 2023-10-14 LAB — VITAMIN D 25 HYDROXY (VIT D DEFICIENCY, FRACTURES): Vit D, 25-Hydroxy: 24.4 ng/mL — ABNORMAL LOW (ref 30.0–100.0)

## 2023-10-14 LAB — HEMOGLOBIN A1C
Est. average glucose Bld gHb Est-mCnc: 126 mg/dL
Hgb A1c MFr Bld: 6 % — ABNORMAL HIGH (ref 4.8–5.6)

## 2023-10-14 NOTE — Addendum Note (Signed)
Addended by: Malva Limes on: 10/14/2023 08:10 AM   Modules accepted: Orders

## 2023-10-17 DIAGNOSIS — G309 Alzheimer's disease, unspecified: Secondary | ICD-10-CM | POA: Diagnosis not present

## 2023-10-17 DIAGNOSIS — R2681 Unsteadiness on feet: Secondary | ICD-10-CM | POA: Diagnosis not present

## 2023-10-17 DIAGNOSIS — F0280 Dementia in other diseases classified elsewhere without behavioral disturbance: Secondary | ICD-10-CM | POA: Diagnosis not present

## 2023-11-17 DIAGNOSIS — F0280 Dementia in other diseases classified elsewhere without behavioral disturbance: Secondary | ICD-10-CM | POA: Diagnosis not present

## 2023-11-17 DIAGNOSIS — G309 Alzheimer's disease, unspecified: Secondary | ICD-10-CM | POA: Diagnosis not present

## 2023-11-17 DIAGNOSIS — R2681 Unsteadiness on feet: Secondary | ICD-10-CM | POA: Diagnosis not present

## 2023-12-17 DIAGNOSIS — R2681 Unsteadiness on feet: Secondary | ICD-10-CM | POA: Diagnosis not present

## 2023-12-17 DIAGNOSIS — G309 Alzheimer's disease, unspecified: Secondary | ICD-10-CM | POA: Diagnosis not present

## 2023-12-17 DIAGNOSIS — F0280 Dementia in other diseases classified elsewhere without behavioral disturbance: Secondary | ICD-10-CM | POA: Diagnosis not present

## 2023-12-31 ENCOUNTER — Encounter: Payer: Self-pay | Admitting: Podiatry

## 2023-12-31 ENCOUNTER — Ambulatory Visit (INDEPENDENT_AMBULATORY_CARE_PROVIDER_SITE_OTHER): Payer: No Typology Code available for payment source | Admitting: Podiatry

## 2023-12-31 DIAGNOSIS — M79675 Pain in left toe(s): Secondary | ICD-10-CM

## 2023-12-31 DIAGNOSIS — M79674 Pain in right toe(s): Secondary | ICD-10-CM | POA: Diagnosis not present

## 2023-12-31 DIAGNOSIS — B351 Tinea unguium: Secondary | ICD-10-CM

## 2024-01-04 ENCOUNTER — Telehealth: Payer: Self-pay | Admitting: Family Medicine

## 2024-01-05 NOTE — Progress Notes (Signed)
  Subjective:  Patient ID: Luke [name], male    DOB: November 27, 1946,  MRN: [id number]  78 y.o. male presents to clinic today with  thick, elongated toenails of both feet which are tender when wearing enclosed shoe gear.  Patient has h/o dementia and is accompanied by his spouse on today's visit.  New pedal problem(s): None   PCP is Gasper Nancyann BRAVO, MD  Allergies  Allergen Reactions   Donepezil      Hallucinations    Review of Systems: Negative except as noted in the HPI.   Objective:  Luke [name] is a pleasant 78 y.o. male WD, WN in NAD. AAO x 1.  Vascular Examination: Vascular status intact b/l with palpable pedal pulses. CFT immediate b/l. No edema. No pain with calf compression b/l. Skin temperature gradient WNL b/l. No cyanosis or clubbing noted b/l LE.  Neurological Examination: Patient unable to complete sensory examination due to cognition but does respond to external noxious stimuli.   Dermatological Examination: Pedal skin with normal turgor, texture and tone b/l. Toenails 1-5 b/l thick, discolored, elongated with subungual debris and pain on dorsal palpation. No hyperkeratotic lesions noted b/l.   Central band melanonychia left hallux with negative Hutchinson's sign. Wife states this has been present husband's entire life.  Musculoskeletal Examination: Muscle strength 5/5 to b/l LE. No pain, crepitus or joint limitation noted with ROM bilateral LE. No gross bony deformities bilaterally.  Radiographs: None  Last A1c:      Latest Ref Rng & Units 10/13/2023    4:20 PM  Hemoglobin A1C  Hemoglobin-A1c 4.8 - 5.6 % 6.0      Assessment:   1. Pain due to onychomycosis of toenails of both feet    Plan:  Patient was evaluated and treated. All patient's and/or POA's questions/concerns addressed on today's visit. Toenails 1-5 debrided in length and girth without incident. Continue soft, supportive shoe gear daily. Report any pedal injuries to medical professional.  Call office if there are any questions/concerns. -Patient/POA to call should there be question/concern in the interim.  Return in about 3 months (around 03/30/2024).  Delon LITTIE Merlin, DPM      [place] LOCATION: [address] [place] [address]                   Office [phone]   [facility] LOCATION: [address] [place], [place] [address] Office [phone]

## 2024-01-17 DIAGNOSIS — R2681 Unsteadiness on feet: Secondary | ICD-10-CM | POA: Diagnosis not present

## 2024-01-17 DIAGNOSIS — G309 Alzheimer's disease, unspecified: Secondary | ICD-10-CM | POA: Diagnosis not present

## 2024-01-17 DIAGNOSIS — F0280 Dementia in other diseases classified elsewhere without behavioral disturbance: Secondary | ICD-10-CM | POA: Diagnosis not present

## 2024-02-17 DIAGNOSIS — F0280 Dementia in other diseases classified elsewhere without behavioral disturbance: Secondary | ICD-10-CM | POA: Diagnosis not present

## 2024-02-17 DIAGNOSIS — G309 Alzheimer's disease, unspecified: Secondary | ICD-10-CM | POA: Diagnosis not present

## 2024-02-17 DIAGNOSIS — R2681 Unsteadiness on feet: Secondary | ICD-10-CM | POA: Diagnosis not present

## 2024-03-16 DIAGNOSIS — R2681 Unsteadiness on feet: Secondary | ICD-10-CM | POA: Diagnosis not present

## 2024-03-16 DIAGNOSIS — F0280 Dementia in other diseases classified elsewhere without behavioral disturbance: Secondary | ICD-10-CM | POA: Diagnosis not present

## 2024-03-16 DIAGNOSIS — G309 Alzheimer's disease, unspecified: Secondary | ICD-10-CM | POA: Diagnosis not present

## 2024-03-22 ENCOUNTER — Ambulatory Visit: Payer: Self-pay

## 2024-03-22 DIAGNOSIS — Z0000 Encounter for general adult medical examination without abnormal findings: Secondary | ICD-10-CM | POA: Diagnosis not present

## 2024-03-22 NOTE — Patient Instructions (Addendum)
 Luke [name] , Thank you for taking time to come for your Medicare Wellness Visit. I appreciate your ongoing commitment to your health goals. Please review the following plan we discussed and let me know if I can assist you in the future.   Referrals/Orders/Follow-Ups/Clinician Recommendations: NONE  This is a list of the screening recommended for you and due dates:  Health Maintenance  Topic Date Due   Zoster (Shingles) Vaccine (1 of 2) Never done   DTaP/Tdap/Td vaccine (2 - Td or Tdap) 04/30/2020   COVID-19 Vaccine (4 - 2024-25 season) 08/30/2023   Colon Cancer Screening  06/12/2024   Flu Shot  03/28/2024*   Medicare Annual Wellness Visit  03/22/2025   Pneumonia Vaccine  Completed   Hepatitis C Screening  Completed   HPV Vaccine  Aged Out  *Topic was postponed. The date shown is not the original due date.    Advanced directives: (ACP Link)Information on Advanced Care Planning can be found at [facility] Advance Health Care Directives Advance Health Care Directives. [email or web address]   Next Medicare Annual Wellness Visit scheduled for next year: Yes   03/28/25 @ 11:30 AM BY PHONE

## 2024-03-22 NOTE — Progress Notes (Signed)
 Subjective:   Luke [name] is a 78 y.o. who presents for a Medicare Wellness preventive visit.  Visit Complete: Virtual I connected with  [name] on 03/22/24 by a audio enabled telemedicine application and verified that I am speaking with the correct person using two identifiers. Spoke w/ wife, Luke [name]  Patient Location: Home  Provider Location: Office/Clinic  I discussed the limitations of evaluation and management by telemedicine. The patient expressed understanding and agreed to proceed.  Vital Signs: Because this visit was a virtual/telehealth visit, some criteria may be missing or patient reported. Any vitals not documented were not able to be obtained and vitals that have been documented are patient reported.  VideoDeclined- This patient declined Librarian, academic. Therefore the visit was completed with audio only.  Persons Participating in Visit: Patient assisted by wife.  AWV Questionnaire: No: Patient Medicare AWV questionnaire was not completed prior to this visit.  Cardiac Risk Factors include: advanced age (>4men, >89 women);dyslipidemia;male gender;hypertension;sedentary lifestyle     Objective:    There were no vitals filed for this visit. There is no height or weight on file to calculate BMI.     03/22/2024    1:19 PM 03/18/2023    1:37 PM 01/25/2023   10:59 AM 03/13/2022    2:26 PM 09/11/2020    1:28 PM 04/02/2020    4:42 PM 07/18/2019    9:36 AM  Advanced Directives  Does Patient Have a Medical Advance Directive? No No No No No No No  Would patient like information on creating a medical advance directive? No - Patient declined   No - Patient declined No - Patient declined No - Patient declined Yes (MAU/Ambulatory/Procedural Areas - Information given)    Current Medications (verified) Outpatient Encounter Medications as of 03/22/2024  Medication Sig   amLODipine (NORVASC) 5 MG tablet TAKE 1 TABLET(5 MG) BY MOUTH DAILY    hydrochlorothiazide (HYDRODIURIL) 12.5 MG tablet TAKE 1 TABLET(12.5 MG) BY MOUTH DAILY   QUEtiapine (SEROQUEL) 25 MG tablet Take 25 mg by mouth at bedtime as needed.   sertraline (ZOLOFT) 100 MG tablet TAKE 1 TABLET BY MOUTH EVERY DAY   No facility-administered encounter medications on file as of 03/22/2024.    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 Triphasic waver forms   BALLOON DILATION N/A 06/13/2019   Procedure: BALLOON DILATION;  Surgeon: Midge Minium, MD;  Location: [facility];  Service: Endoscopy;  Laterality: N/A;   Carotid Doppler Ultrasound  10/18/2012   Minimal soft plague both carotids. No hemodynamically significant stenosis   CHOLECYSTECTOMY  2001   COLONOSCOPY WITH PROPOFOL N/A 06/13/2019   Procedure: COLONOSCOPY WITH  BIOPSIES;  Surgeon: Midge Minium, MD;  Location: [facility];  Service: Endoscopy;  Laterality: N/A;   ESOPHAGOGASTRODUODENOSCOPY (EGD) WITH PROPOFOL N/A 06/13/2019   Procedure: ESOPHAGOGASTRODUODENOSCOPY (EGD) WITH BALLOON DILATION;  Surgeon: Midge Minium, MD;  Location: [facility];  Service: Endoscopy;  Laterality: N/A;   Lumbar Spine X Ray  11/28/2011   Mild  DDD L1- L2. Anterior heigt loss of T12-L1 vertebral bodies   NEPHRECTOMY Right 2001   done by Dr. Sheppard Penton for Angiomyolipoma   PENILE PROSTHESIS  REMOVAL  11/28/2013   Dr. Sheppard Penton   PENILE PROSTHESIS IMPLANT  2008   Photovaporization of prostate with green light laser  01/19/2012   Dr. Artis Flock   POLYPECTOMY N/A 06/13/2019   Procedure: POLYPECTOMY;  Surgeon: Midge Minium, MD;  Location: [facility];  Service: Endoscopy;  Laterality: N/A;   SPINE SURGERY  1991   UPPER GI ENDOSCOPY  07/10/2011   Done by Dr. Neal Dy. Revealed Schatski rings   Family History  Problem Relation Age of Onset   Heart attack Mother    Hypertension Father    Prostate cancer Neg Hx    Bladder Cancer Neg Hx    Kidney cancer Neg Hx    Social History   Socioeconomic History   Marital status: Married    Spouse name: Not on file   Number of children: 2   Years of education: Not on file   Highest education level: 10th grade  Occupational History   Occupation: Retired  Tobacco Use   Smoking status: Never   Smokeless tobacco: Never   Tobacco comments:    smokes a Education officer, community with friends  Vaping Use   Vaping status: Never Used  Substance and Sexual Activity   Alcohol use: Not Currently    Comment: occasional / monthly   Drug use: No   Sexual activity: Not Currently  Other Topics Concern   Not on file  Social History Narrative   Not on file   Social Drivers of Health   Financial Resource Strain: Low Risk  (03/22/2024)   Overall Financial Resource Strain (CARDIA)    Difficulty of Paying Living Expenses: Not hard at all  Food Insecurity: No Food Insecurity (03/22/2024)   Hunger Vital Sign    Worried About Running Out of Food in the Last Year: Never true    Ran Out of Food in the Last Year: Never true  Transportation Needs: No Transportation Needs (03/22/2024)   PRAPARE - Administrator, Civil Service (Medical): No    Lack of Transportation (Non-Medical): No  Physical Activity:  Insufficiently Active (03/22/2024)   Exercise Vital Sign    Days of Exercise per Week: 2 days    Minutes of Exercise per Session: 20 min  Stress: No Stress Concern Present (03/22/2024)   [employer] of Occupational Health - Occupational Stress Questionnaire    Feeling of Stress : Not at all  Social Connections: Socially Isolated (03/22/2024)   Social Connection and Isolation Panel [NHANES]    Frequency of Communication with Friends and Family: Never    Frequency of Social Gatherings with Friends and Family: Twice a week    Attends Religious Services: Never    Database administrator or Organizations: No    Attends Engineer, structural: Never    Marital Status: Married    Tobacco Counseling Counseling given: Not Answered Tobacco comments: smokes a Education officer, community with friends    Clinical Intake:  Pre-visit preparation completed: Yes  Pain :  No/denies pain     Nutritional Risks: None Diabetes: No  Lab Results  Component Value Date   HGBA1C 6.0 (H) 10/13/2023   HGBA1C 5.6 03/21/2022   HGBA1C 6.2 (H) 03/26/2021     How often do you need to have someone help you when you read instructions, pamphlets, or other written materials from your doctor or pharmacy?: 1 - Never  Interpreter Needed?: No  Information entered by :: Kennedy Bucker, LPN   Activities of Daily Living     03/22/2024    1:20 PM  In your present state of health, do you have any difficulty performing the following activities:  Hearing? 0  Vision? 0  Difficulty concentrating or making decisions? 1  Walking or climbing stairs? 1  Comment ARTHRITIS IN KNEES  Dressing or bathing? 1  Doing errands, shopping? 1  Preparing Food and eating ? Y  Using the Toilet? Y  In the past six months, have you accidently leaked urine? N  Do you have problems with loss of bowel control? N  Managing your Medications? Y  Managing your Finances? Y  Housekeeping or managing your Housekeeping? Y     Patient Care Team: Malva Limes, MD as PCP - General (Family Medicine) Riki Altes, MD as Consulting Physician (Urology) Midge Minium, MD as Consulting Physician (Gastroenterology) [facility], [facility] (Optometry) Lonell Face, MD as Consulting Physician (Neurology)  Indicate any recent Medical Services you may have received from other than [facility] providers in the past year (date may be approximate).     Assessment:   This is a routine wellness examination for [place].  Hearing/Vision screen Hearing Screening - Comments:: NO AIDS Vision Screening - Comments:: NO GLASSES   Goals Addressed             This Visit's Progress    DIET - REDUCE SUGAR INTAKE         Depression Screen     03/22/2024    1:18 PM 03/18/2023    1:33 PM 02/09/2023    8:48 AM 03/13/2022    2:25 PM 10/15/2020    3:04 PM 09/11/2020    1:25 PM 08/03/2020    1:49 PM  PHQ 2/9 Scores  PHQ - 2 Score 0 0  0 0 1 0  PHQ- 9 Score 0        Exception Documentation   Medical reason        Fall Risk     03/22/2024    1:19 PM 10/12/2023    4:11 PM 03/18/2023    1:31 PM 02/09/2023    8:48 AM 03/13/2022    2:27 PM  Fall Risk   Falls in the past year? 0 0 1 1 0  Number falls in past yr: 0 0 0 0 0  Injury with Fall? 0 0 0 0 0  Risk for fall due to : No Fall Risks  No Fall Risks  No Fall Risks  Follow up Falls prevention discussed;Falls evaluation completed  Education provided;Falls prevention discussed  Falls evaluation completed    MEDICARE RISK AT HOME:     TIMED UP AND GO:  Was the test performed?  No  Cognitive Function: PT UNABLE TO PARTICIPATE, NOT A & O        03/18/2023    1:46 PM 05/12/2017    9:45 AM  6CIT Screen  What Year? -- 4 points  What month?  3 points  What time?  0 points  Count back  from 20  0 points  Months in reverse  4 points  Repeat phrase  10 points  Total Score  21 points    Immunizations Immunization History  Administered Date(s) Administered   Fluad  Quad(high Dose 65+) 10/03/2019, 09/12/2020, 03/21/2022, 03/04/2023   Influenza, High Dose Seasonal PF 11/30/2015, 09/09/2016, 09/01/2017, 09/17/2018   Influenza-Unspecified 11/28/2013   Moderna Sars-Covid-2 Vaccination 11/26/2020   PFIZER(Purple Top)SARS-COV-2 Vaccination 02/07/2020, 02/28/2020   Pneumococcal Conjugate-13 02/07/2016   Pneumococcal Polysaccharide-23 04/30/2010, 05/12/2017   Tdap 04/30/2010    Screening Tests Health Maintenance  Topic Date Due   Zoster Vaccines- Shingrix (1 of 2) Never done   DTaP/Tdap/Td (2 - Td or Tdap) 04/30/2020   COVID-19 Vaccine (4 - 2024-25 season) 08/30/2023   Colonoscopy  06/12/2024   INFLUENZA VACCINE  03/28/2024 (Originally 07/30/2023)   Medicare Annual Wellness (AWV)  03/22/2025   Pneumonia Vaccine 53+ Years old  Completed   Hepatitis C Screening  Completed   HPV VACCINES  Aged Out    Health Maintenance  Health Maintenance Due  Topic Date Due   Zoster Vaccines- Shingrix (1 of 2) Never done   DTaP/Tdap/Td (2 - Td or Tdap) 04/30/2020   COVID-19 Vaccine (4 - 2024-25 season) 08/30/2023   Colonoscopy  06/12/2024   Health Maintenance Items Addressed: UP TO DATE W/ COLONOSCOPY, AGED OUT  Additional Screening:  Vision Screening: Recommended annual ophthalmology exams for early detection of glaucoma and other disorders of the eye.  Dental Screening: Recommended annual dental exams for proper oral hygiene  Community Resource Referral / Chronic Care Management: CRR required this visit?  No   CCM required this visit?  No     Plan:     I have personally reviewed and noted the following in the patient's chart:   Medical and social history Use of alcohol, tobacco or illicit drugs  Current medications and supplements including opioid prescriptions. Patient is not currently taking opioid prescriptions. Functional ability and status Nutritional status Physical activity Advanced directives List of other physicians Hospitalizations,  surgeries, and ER visits in previous 12 months Vitals Screenings to include cognitive, depression, and falls Referrals and appointments  In addition, I have reviewed and discussed with patient certain preventive protocols, quality metrics, and best practice recommendations. A written personalized care plan for preventive services as well as general preventive health recommendations were provided to patient.     Hal Hope, LPN   1/61/0960   After Visit Summary: (MyChart) Due to this being a telephonic visit, the after visit summary with patients personalized plan was offered to patient via MyChart   Notes: Nothing significant to report at this time.

## 2024-03-26 ENCOUNTER — Other Ambulatory Visit: Payer: Self-pay | Admitting: Family Medicine

## 2024-03-31 ENCOUNTER — Encounter: Payer: Self-pay | Admitting: Podiatry

## 2024-03-31 ENCOUNTER — Ambulatory Visit (INDEPENDENT_AMBULATORY_CARE_PROVIDER_SITE_OTHER): Payer: No Typology Code available for payment source | Admitting: Podiatry

## 2024-03-31 DIAGNOSIS — L600 Ingrowing nail: Secondary | ICD-10-CM

## 2024-03-31 DIAGNOSIS — D239 Other benign neoplasm of skin, unspecified: Secondary | ICD-10-CM | POA: Diagnosis not present

## 2024-03-31 NOTE — Progress Notes (Signed)
  Subjective:  Patient ID: Luke [name], male    DOB: [birth year],  MRN: [id number]  78 y.o. male presents follow up lesion left great toe and painful, elongated thickened toenails x 10 which are symptomatic when wearing enclosed shoe gear. This interferes with his/her daily activities. Patient has h/o dementia and is accompanied by his wife on today's visit. Chief Complaint  Patient presents with   Nail Problem    "Get his toenails trimmed and check the place on his toe."   New problem(s): None   PCP is Malva Limes, MD. Luke [name] 10/12/2023.  Allergies  Allergen Reactions   Donepezil     Hallucinations    Review of Systems: Negative except as noted in the HPI.   Objective:  Luke [name] is a pleasant 78 y.o. male WD, WN in NAD. AAO x 3.  Vascular Examination: Vascular status intact b/l with palpable pedal pulses. CFT immediate b/l. Pedal hair present. No edema. No pain with calf compression b/l. Skin temperature gradient WNL b/l. No varicosities noted. No cyanosis or clubbing noted.  Neurological Examination: Patient unable to complete sensory examination due to cognition but does respond to external noxious stimuli.   Dermatological Examination: Pedal skin with normal turgor, texture and tone b/l. Toenails 1-5 b/l thick, discolored, elongated with subungual debris and pain on dorsal palpation. No hyperkeratotic lesions noted b/l.   Central band melanonychia left hallux 2.0 x 0.8 cm with negative Hutchinson's sign. Wife states this has been present husband's entire life.  Right great toenail incurvated at medial border with no nail border hypertrophy, no erythema, no edema nor drainage.  Musculoskeletal Examination: Muscle strength 5/5 to b/l LE.  No pain, crepitus noted b/l. No gross pedal deformities. Patient ambulates independently without assistive aids.   Radiographs: None  Last A1c:      Latest Ref Rng & Units 10/13/2023    4:20 PM  Hemoglobin A1C   Hemoglobin-A1c 4.8 - 5.6 % 6.0     Assessment:   1. Benign neoplasm of nail apparatus   2. Ingrown toenail without infection    Plan:  -Patient's family member present. All questions/concerns addressed on today's visit. -Patient to continue soft, supportive shoe gear daily. -Check left great toe benign lesion for any changes. Stable today with no changes in appearance. Remains asymptomatic. -As a courtesy, toenails 1-5 b/l were debrided in length and girth with sterile nail nippers and dremel without iatrogenic bleeding.  -Patient/POA to call should there be question/concern in the interim.  Return in about 3 months (around 06/30/2024).  Freddie Breech, DPM      [place] LOCATION: [address], [place] [address]                   Office [phone]   [facility] LOCATION: [address] [place], [place] [address] Office [phone]

## 2024-04-16 DIAGNOSIS — F0280 Dementia in other diseases classified elsewhere without behavioral disturbance: Secondary | ICD-10-CM | POA: Diagnosis not present

## 2024-04-16 DIAGNOSIS — G309 Alzheimer's disease, unspecified: Secondary | ICD-10-CM | POA: Diagnosis not present

## 2024-04-16 DIAGNOSIS — R2681 Unsteadiness on feet: Secondary | ICD-10-CM | POA: Diagnosis not present

## 2024-04-18 ENCOUNTER — Ambulatory Visit: Payer: Self-pay | Admitting: Family Medicine

## 2024-04-22 ENCOUNTER — Encounter: Payer: Self-pay | Admitting: Family Medicine

## 2024-04-22 ENCOUNTER — Ambulatory Visit (INDEPENDENT_AMBULATORY_CARE_PROVIDER_SITE_OTHER): Admitting: Family Medicine

## 2024-04-22 VITALS — BP 140/86 | HR 59 | Resp 16 | Wt 159.0 lb

## 2024-04-22 DIAGNOSIS — R634 Abnormal weight loss: Secondary | ICD-10-CM | POA: Diagnosis not present

## 2024-04-22 DIAGNOSIS — E559 Vitamin D deficiency, unspecified: Secondary | ICD-10-CM | POA: Diagnosis not present

## 2024-04-22 DIAGNOSIS — G309 Alzheimer's disease, unspecified: Secondary | ICD-10-CM | POA: Diagnosis not present

## 2024-04-22 DIAGNOSIS — I10 Essential (primary) hypertension: Secondary | ICD-10-CM | POA: Diagnosis not present

## 2024-04-22 DIAGNOSIS — R7303 Prediabetes: Secondary | ICD-10-CM | POA: Diagnosis not present

## 2024-04-22 DIAGNOSIS — F0280 Dementia in other diseases classified elsewhere without behavioral disturbance: Secondary | ICD-10-CM | POA: Diagnosis not present

## 2024-04-22 DIAGNOSIS — Z125 Encounter for screening for malignant neoplasm of prostate: Secondary | ICD-10-CM | POA: Diagnosis not present

## 2024-04-22 DIAGNOSIS — F0150 Vascular dementia without behavioral disturbance: Secondary | ICD-10-CM | POA: Diagnosis not present

## 2024-04-22 MED ORDER — QUETIAPINE FUMARATE 25 MG PO TABS: 25.0000 mg | ORAL_TABLET | Freq: Every evening | ORAL | 3 refills | Status: AC | PRN

## 2024-04-22 NOTE — Patient Instructions (Signed)
 Marland Kitchen  Please review the attached list of medications and notify my office if there are any errors.   . Please bring all of your medications to every appointment so we can make sure that our medication list is the same as yours.

## 2024-04-22 NOTE — Progress Notes (Signed)
 Established patient visit   Patient: Luke [name]   DOB: [birth year]   78 y.o. Male  MRN: [id number] Visit Date: 04/22/2024  Today's healthcare provider: Jeralene Mom, MD   Chief Complaint  Patient presents with   Medical Management of Chronic Issues   Subjective    HPI  Follow up htn and Alzheimers. Still lives at home with his wife. No longer being followed by Dr. Mason Sole. Dr. Mason Sole initially prescribed quetiapine  to help rest better at night and his wife reports it has helped quite a bit. His appetite is good and eating three meals a day with snacks, but continues to lose weight. His vitamin D  was low last labs, but he spits out the vitamin d  supplement.   Lab Results  Component Value Date   HGBA1C 6.0 (H) 10/13/2023   Lab Results  Component Value Date   VD25OH 24.4 (L) 10/13/2023   Lab Results  Component Value Date   NA 147 (H) 10/13/2023   K 3.5 10/13/2023   CREATININE 1.18 10/13/2023   EGFR 64 10/13/2023   GLUCOSE 60 (L) 10/13/2023     Medications: Outpatient Medications Prior to Visit  Medication Sig   amLODipine  (NORVASC ) 5 MG tablet TAKE 1 TABLET(5 MG) BY MOUTH DAILY   hydrochlorothiazide  (HYDRODIURIL ) 12.5 MG tablet TAKE 1 TABLET(12.5 MG) BY MOUTH DAILY   sertraline  (ZOLOFT ) 100 MG tablet TAKE 1 TABLET BY MOUTH EVERY DAY   QUEtiapine  (SEROQUEL ) 25 MG tablet Take 25 mg by mouth at bedtime as needed.   No facility-administered medications prior to visit.    Review of Systems  Constitutional:  Negative for appetite change, chills and fever.  Respiratory:  Negative for chest tightness, shortness of breath and wheezing.   Cardiovascular:  Negative for chest pain and palpitations.  Gastrointestinal:  Negative for abdominal pain, nausea and vomiting.       Objective    BP (!) 140/86 (BP Location: Left Arm, Patient Position: Sitting, Cuff Size: Normal)   Pulse (!) 59   Resp 16   Wt 159 lb (72.1 kg)   BMI 21.56 kg/m    Physical Exam   General:  Appearance:    Well developed, well nourished male in no acute distress  Eyes:    PERRL, conjunctiva/corneas clear, EOM's intact       Lungs:     Clear to auscultation bilaterally, respirations unlabored  Heart:    Bradycardic. Normal rhythm. No murmurs, rubs, or gallops.    MS:   All extremities are intact.    Neurologic:   Awake, alert, oriented x 3. No apparent focal neurological defect.         Assessment & Plan     1. Primary hypertension (Primary) Well controlled. Continue current medications.    2. Mixed Alzheimer's and vascular dementia (HCC) Stable on current medications. No longer being followed by Dr. Mason Sole. Quetiapine  has helped rest but other dementia were not making significant difference and no longer taking.  - QUEtiapine  (SEROQUEL ) 25 MG tablet; Take 1 tablet (25 mg total) by mouth at bedtime as needed.  Dispense: 90 tablet; Refill: 3  3. Prediabetes  - Hemoglobin A1c  4. Vitamin D  deficiency Patient spitting out his vitamin D  capsules.  - VITAMIN D  25 Hydroxy (Vit-D Deficiency, Fractures)  5 Unexplained weight loss May be due to dementia. Check labs.  - Comprehensive metabolic panel with GFR - CBC - TSH  6. Prostate cancer screening  - PSA Total (Reflex To  Free)   Return in about 6 months (around 10/22/2024).         Jeralene Mom, MD  [facility] [phone] (phone) [phone] (fax)  [facility]

## 2024-04-23 LAB — COMPREHENSIVE METABOLIC PANEL WITH GFR
ALT: 11 IU/L (ref 0–44)
AST: 16 IU/L (ref 0–40)
Albumin: 4 g/dL (ref 3.8–4.8)
Alkaline Phosphatase: 73 IU/L (ref 44–121)
BUN/Creatinine Ratio: 18 (ref 10–24)
BUN: 19 mg/dL (ref 8–27)
Bilirubin Total: 0.5 mg/dL (ref 0.0–1.2)
CO2: 28 mmol/L (ref 20–29)
Calcium: 9.4 mg/dL (ref 8.6–10.2)
Chloride: 104 mmol/L (ref 96–106)
Creatinine, Ser: 1.06 mg/dL (ref 0.76–1.27)
Globulin, Total: 2.3 g/dL (ref 1.5–4.5)
Glucose: 77 mg/dL (ref 70–99)
Potassium: 3.7 mmol/L (ref 3.5–5.2)
Sodium: 143 mmol/L (ref 134–144)
Total Protein: 6.3 g/dL (ref 6.0–8.5)
eGFR: 72 mL/min/{1.73_m2} (ref >59)

## 2024-04-23 LAB — CBC
Hematocrit: 40.8 % (ref 37.5–51.0)
Hemoglobin: 12.8 g/dL — ABNORMAL LOW (ref 13.0–17.7)
MCH: 22.2 pg — ABNORMAL LOW (ref 26.6–33.0)
MCHC: 31.4 g/dL — ABNORMAL LOW (ref 31.5–35.7)
MCV: 71 fL — ABNORMAL LOW (ref 79–97)
Platelets: 218 10*3/uL (ref 150–450)
RBC: 5.77 x10E6/uL (ref 4.14–5.80)
RDW: 15.8 % — ABNORMAL HIGH (ref 11.6–15.4)
WBC: 4.5 10*3/uL (ref 3.4–10.8)

## 2024-04-23 LAB — HEMOGLOBIN A1C
Est. average glucose Bld gHb Est-mCnc: 111 mg/dL
Hgb A1c MFr Bld: 5.5 % (ref 4.8–5.6)

## 2024-04-23 LAB — PSA TOTAL (REFLEX TO FREE): Prostate Specific Ag, Serum: 4.5 ng/mL — ABNORMAL HIGH (ref 0.0–4.0)

## 2024-04-23 LAB — TSH: TSH: 2.35 u[IU]/mL (ref 0.450–4.500)

## 2024-04-23 LAB — FPSA% REFLEX
% FREE PSA: 3.6 %
PSA, FREE: 0.16 ng/mL

## 2024-04-23 LAB — VITAMIN D 25 HYDROXY (VIT D DEFICIENCY, FRACTURES): Vit D, 25-Hydroxy: 22.3 ng/mL — ABNORMAL LOW (ref 30.0–100.0)

## 2024-05-03 ENCOUNTER — Telehealth: Payer: Self-pay

## 2024-05-03 NOTE — Telephone Encounter (Signed)
 Copied from CRM [id number]. Topic: Clinical - Lab/Test Results >> May 03, 2024 10:53 AM Luke [name] wrote: Reason for CRM: Pt needs PSA recheck in 2 weeks please advise when lab orders are available.

## 2024-05-16 DIAGNOSIS — R2681 Unsteadiness on feet: Secondary | ICD-10-CM | POA: Diagnosis not present

## 2024-05-16 DIAGNOSIS — F0280 Dementia in other diseases classified elsewhere without behavioral disturbance: Secondary | ICD-10-CM | POA: Diagnosis not present

## 2024-05-16 DIAGNOSIS — G309 Alzheimer's disease, unspecified: Secondary | ICD-10-CM | POA: Diagnosis not present

## 2024-06-03 ENCOUNTER — Telehealth: Payer: Self-pay

## 2024-06-03 DIAGNOSIS — R972 Elevated prostate specific antigen [PSA]: Secondary | ICD-10-CM

## 2024-06-03 NOTE — Telephone Encounter (Signed)
 Copied from CRM [id number]. Topic: Clinical - Request for Lab/Test Order >> Jun 03, 2024  1:58 PM Donald Frost wrote: Reason for CRM: The spouse of the patient called stating she was told the provider wanted an updated PSA done on the patient. He was last seen in April and his numbers were a little off. Please assist as soon as possible and put an order int he system if needed as she will be coming for an appt today and he will be with her.

## 2024-06-03 NOTE — Telephone Encounter (Signed)
 ordered

## 2024-06-04 LAB — PSA TOTAL (REFLEX TO FREE): Prostate Specific Ag, Serum: 5 ng/mL — ABNORMAL HIGH (ref 0.0–4.0)

## 2024-06-04 LAB — FPSA% REFLEX
% FREE PSA: 3.6 %
PSA, FREE: 0.18 ng/mL

## 2024-06-16 DIAGNOSIS — F0280 Dementia in other diseases classified elsewhere without behavioral disturbance: Secondary | ICD-10-CM | POA: Diagnosis not present

## 2024-06-16 DIAGNOSIS — G309 Alzheimer's disease, unspecified: Secondary | ICD-10-CM | POA: Diagnosis not present

## 2024-06-16 DIAGNOSIS — R2681 Unsteadiness on feet: Secondary | ICD-10-CM | POA: Diagnosis not present

## 2024-06-21 ENCOUNTER — Ambulatory Visit: Payer: Self-pay | Admitting: Family Medicine

## 2024-06-21 DIAGNOSIS — R972 Elevated prostate specific antigen [PSA]: Secondary | ICD-10-CM

## 2024-06-22 ENCOUNTER — Other Ambulatory Visit: Payer: Self-pay | Admitting: Family Medicine

## 2024-06-22 DIAGNOSIS — I10 Essential (primary) hypertension: Secondary | ICD-10-CM

## 2024-07-07 ENCOUNTER — Ambulatory Visit: Admitting: Podiatry

## 2024-07-16 DIAGNOSIS — R2681 Unsteadiness on feet: Secondary | ICD-10-CM | POA: Diagnosis not present

## 2024-07-16 DIAGNOSIS — G309 Alzheimer's disease, unspecified: Secondary | ICD-10-CM | POA: Diagnosis not present

## 2024-07-16 DIAGNOSIS — F0280 Dementia in other diseases classified elsewhere without behavioral disturbance: Secondary | ICD-10-CM | POA: Diagnosis not present

## 2024-07-21 ENCOUNTER — Ambulatory Visit (INDEPENDENT_AMBULATORY_CARE_PROVIDER_SITE_OTHER): Admitting: Urology

## 2024-07-21 VITALS — BP 133/74 | HR 61 | Ht 72.0 in | Wt 160.0 lb

## 2024-07-21 DIAGNOSIS — C61 Malignant neoplasm of prostate: Secondary | ICD-10-CM

## 2024-07-21 NOTE — Patient Instructions (Addendum)
 Please Call [phone] To Schedule Your Scan.

## 2024-07-21 NOTE — Progress Notes (Signed)
 07/21/24 1:56 PM   Luke [name] Jun 19, 1946 [id number]  CC: History of prostate cancer, new rising PSA, hx nephrectomy  HPI: 78 year old male with severe dementia who has been previously followed by Dr. Twylla as well as Dr. Gala.  He is referred for rising PSA.  His history is notable for intermediate risk prostate cancer treated with brachytherapy in 2014 by Dr. Gala, PSA had been low and stable up to 2022 when it was 0.4, however recently increased to 4.5(4% free) in April 2025 and 5.0(4% free) in June 2025.  He also has a history of ED treated with a penile prosthesis, this was ultimately removed after he had erosion of the reservoir into the bladder.  He has had problems with intermittent chronic scrotal pain since that time.  Also history of right open nephrectomy 2001 by Dr. Gala for angiomyolipoma.  He is non-verbal today in clinic, history provided entirely by his wife and daughters.  Denies any urinary symptoms, gross hematuria, or back pain.  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   Dysphagia 04/02/2009   GERD (gastroesophageal reflux disease) 02/06/2016   Schatski ring seen on EGD 07-10-11    History of MRSA infection 11/18/2007   Hyperlipidemia, mixed 12/29/1998   Hypertension 04/02/2009   Hypogonadism male 02/06/2016   Insomnia 02/06/2016   LBP (low back pain) 07/13/2013   Numbness of toes 02/06/2016   Palpitations 02/06/2016   Prediabetes 02/08/2016   A1c=6.2 02/08/16    Prostate cancer (HCC) 2014   Seed Implants   Single kidney    Vitamin D  deficiency 02/06/2016    Surgical History: Past Surgical History:  Procedure Laterality Date   ABI  07/09/2011   Normal; Left= 1.21, Right=1.00. Triphasic waver forms   BALLOON DILATION N/A 06/13/2019   Procedure: BALLOON DILATION;  Surgeon: Jinny Carmine, MD;  Location: [facility]ERY [facility];  Service: Endoscopy;  Laterality: N/A;   Carotid Doppler Ultrasound  10/18/2012   Minimal soft plague both carotids. No hemodynamically significant stenosis   CHOLECYSTECTOMY  2001   COLONOSCOPY WITH PROPOFOL  N/A 06/13/2019   Procedure: COLONOSCOPY WITH BIOPSIES;  Surgeon: Jinny Carmine, MD;  Location: [facility]ERY [facility];  Service: Endoscopy;  Laterality: N/A;   ESOPHAGOGASTRODUODENOSCOPY (EGD) WITH PROPOFOL  N/A 06/13/2019   Procedure: ESOPHAGOGASTRODUODENOSCOPY (EGD) WITH BALLOON DILATION;  Surgeon: Jinny Carmine, MD;  Location: [facility] SURGERY [facility];  Service: Endoscopy;  Laterality: N/A;   Lumbar Spine X Ray  11/28/2011   Mild DDD L1- L2. Anterior heigt loss of T12-L1 vertebral bodies   NEPHRECTOMY Right 2001   done by Dr. Gala for Angiomyolipoma   PENILE PROSTHESIS  REMOVAL  11/28/2013   Dr. Gala   PENILE PROSTHESIS IMPLANT  2008   Photovaporization of prostate with green light laser  01/19/2012   Dr. Waddell   POLYPECTOMY N/A 06/13/2019   Procedure: POLYPECTOMY;  Surgeon: Jinny Carmine, MD;  Location: [facility]ERY [facility];  Service: Endoscopy;  Laterality: N/A;   SPINE SURGERY  1991   UPPER GI ENDOSCOPY  07/10/2011   Done by Dr. Evy. Revealed Schatski rings    Family History: Family History  Problem Relation Age of Onset   Heart attack Mother    Hypertension Father    Prostate cancer Neg Hx    Bladder Cancer Neg Hx    Kidney cancer Neg Hx     Social  History:  reports that he has never smoked. He has never used smokeless tobacco. He reports that he does not currently use alcohol. He reports that he does not use drugs.  Physical Exam: BP 133/74 (BP Location: Left Arm, Patient Position: Sitting, Cuff Size: Normal)   Pulse 61   Ht 6' (1.829 m)   Wt 160 lb (72.6 kg)   SpO2 100%   BMI 21.70 kg/m    Constitutional: Flat affect, nonverbal Cardiovascular: No clubbing, cyanosis, or edema. Respiratory: Normal respiratory effort, no increased work of breathing. GI:  Abdomen is soft, nontender, nondistended, no abdominal masses   Laboratory Data: Reviewed, see HPI   Assessment & Plan:   78 year old male previously followed by Dr. Twylla as well as Dr. Gala.  History of nephrectomy in 2001 by Dr. Gala for AML, as well as intermediate risk prostate cancer treated by Dr. Gala in 2014 with brachytherapy.  PSA had remained very low around 0.4, last checked in 2022 until elevated levels of 4.5 in April 2025 and 5.0 June 2025.  High suspicion for recurrence of disease, potentially aggressive based on significant increase from 2022.  Very challenging situation with his severe dementia.  I discussed options at length with the family today including PSMA PET scan for further evaluation of recurrence of disease or more of a watchful waiting approach with PSA monitoring every 6 months.  Ultimately, they are interested in a PET scan for further evaluation, but with his severe dementia would like to avoid any aggressive treatments.  PSMA PET scan, call with results  Redell Burnet, MD 07/21/2024  [facility] [address], [address] [place], [place] [address] [phone]

## 2024-08-02 DIAGNOSIS — R269 Unspecified abnormalities of gait and mobility: Secondary | ICD-10-CM | POA: Diagnosis not present

## 2024-08-02 DIAGNOSIS — I10 Essential (primary) hypertension: Secondary | ICD-10-CM | POA: Diagnosis not present

## 2024-08-02 DIAGNOSIS — Z008 Encounter for other general examination: Secondary | ICD-10-CM | POA: Diagnosis not present

## 2024-08-10 ENCOUNTER — Ambulatory Visit
Admission: RE | Admit: 2024-08-10 | Discharge: 2024-08-10 | Disposition: A | Discharge status: Home / Self Care | Source: Ambulatory Visit | Attending: Urology

## 2024-08-10 DIAGNOSIS — N433 Hydrocele, unspecified: Secondary | ICD-10-CM | POA: Insufficient documentation

## 2024-08-10 DIAGNOSIS — C61 Malignant neoplasm of prostate: Secondary | ICD-10-CM | POA: Diagnosis not present

## 2024-08-10 DIAGNOSIS — I700 Atherosclerosis of aorta: Secondary | ICD-10-CM | POA: Insufficient documentation

## 2024-08-10 MED ORDER — FLOTUFOLASTAT F 18 GALLIUM 296-5846 MBQ/ML IV SOLN
8.6800 | Freq: Once | INTRAVENOUS | Status: AC
  Administered 2024-08-10 (×2): 8.68 via INTRAVENOUS
  Filled 2024-08-10: qty 9

## 2024-08-18 ENCOUNTER — Ambulatory Visit: Payer: Self-pay | Admitting: Urology

## 2024-08-23 ENCOUNTER — Ambulatory Visit (INDEPENDENT_AMBULATORY_CARE_PROVIDER_SITE_OTHER): Admitting: Urology

## 2024-08-23 DIAGNOSIS — C61 Malignant neoplasm of prostate: Secondary | ICD-10-CM

## 2024-08-23 NOTE — Progress Notes (Signed)
 Virtual Visit via Telephone Note  I connected with Luke [name] on 08/23/24 at  4:00 PM EDT by telephone and verified that I am speaking with the correct person using two identifiers.  Patient with severe dementia and minimally verbal, wife is caretaker.   Patient location: Home Provider location: [facility]   I discussed the limitations, risks, security and privacy concerns of performing an evaluation and management service by telephone and the availability of in person appointments.   Reason for visit: Prostate cancer recurrence  History of Present Illness: 78 year old male with severe dementia, minimally verbal, who I saw for the first time in clinic in July 2025.  Was previously followed by Dr. Gala and treated with brachytherapy for intermediate risk prostate cancer in 2014.  PSA had been low until increasing recently to 4.5 in April 2025 and 5.0 in June 2025.  Family opted for a PSMA PET scan for further evaluation.  PSMA PET scan showed activity in the prostate consistent with recurrence but no evidence of metastatic disease.  We again discussed options, and with his multitude of comorbidities and severe dementia I think watchful waiting is very appropriate.  He is asymptomatic at this time.  Risks of treatment discussed at length and family is in agreement for watchful waiting.   Follow Up: RTC 6 months PSA prior   I discussed the assessment and treatment plan with the patient. The patient was provided an opportunity to ask questions and all were answered. The patient agreed with the plan and demonstrated an understanding of the instructions.   The patient was advised to call back or seek an in-person evaluation if the symptoms worsen or if the condition fails to improve as anticipated.  I provided 12 minutes of non-face-to-face time during this encounter.   Redell JAYSON Burnet, MD

## 2024-09-26 ENCOUNTER — Telehealth: Payer: Self-pay | Admitting: Family Medicine

## 2024-09-26 NOTE — Telephone Encounter (Signed)
 I'm sending FMLA forms back for completion.   Cathlean Leek, daughter,  is having to take a leave of absence to help take care of her father since her mother, Nathanel, has had surgery and not able to take care of [place].

## 2024-09-30 NOTE — Telephone Encounter (Signed)
 FMLA form requires beginning and end dates that she is out of work to care for her father. If intermittent leave is needed than FMLA requires estimate of how many times per week or month she will out over the next 6 months, and how long she will be out with each episode.

## 2024-10-02 ENCOUNTER — Ambulatory Visit
Admission: EM | Admit: 2024-10-02 | Discharge: 2024-10-02 | Disposition: A | Discharge status: Home / Self Care | Attending: Student | Admitting: Student

## 2024-10-02 DIAGNOSIS — K5901 Slow transit constipation: Secondary | ICD-10-CM

## 2024-10-02 MED ORDER — LACTULOSE 20 GM/30ML PO SOLN: 15.0000 g | Freq: Two times a day (BID) | ORAL | 0 refills | Status: AC

## 2024-10-02 NOTE — ED Triage Notes (Signed)
 Wife states that patient is constipated. Wife states that patient had a hard small bm Friday. Wife states that she can feel stool in his rectum. Wife states that she gave him several laxatives  with no relief.

## 2024-10-02 NOTE — Discharge Instructions (Addendum)
-  Lactulose twice daily for up to 5 days, and until bowel movement is elicited. -Miralax 4 capfuls in morning and evening, until bowel movement is elicited. Take this medication with 32oz of water .  -Eat a diet high in fiber and leafy greens.

## 2024-10-02 NOTE — ED Provider Notes (Signed)
 [facility]    CSN: [id number] Arrival date & time: 10/02/24  1350      History   Chief Complaint Chief Complaint  Patient presents with   Constipation    HPI Luke [name] is a 78 y.o. male presenting with constipation.  Here today with wife and both daughters.  The patient has a history of Alzheimer's, nephrectomy, cholecystectomy.  Wife describes constipation for approximately 1 week.  His last regular bowel movement was 10/2, and his last small hard bowel movement was 10/3.  They have been pushing magnesium citrate and MiraLAX at home without eliciting a bowel movement.  He is still passing gas.  The patient is unable to provide history, but is sitting comfortably throughout our conversation.   HPI  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   Dysphagia 04/02/2009   GERD (gastroesophageal reflux disease) 02/06/2016   Schatski ring seen on EGD 07-10-11    History of MRSA infection 11/18/2007   Hyperlipidemia, mixed 12/29/1998   Hypertension 04/02/2009   Hypogonadism male 02/06/2016   Insomnia 02/06/2016   LBP (low back pain) 07/13/2013   Numbness of toes 02/06/2016   Palpitations 02/06/2016   Prediabetes 02/08/2016   A1c=6.2 02/08/16    Prostate cancer (HCC) 2014   Seed Implants   Single kidney    Vitamin D  deficiency 02/06/2016    Patient Active Problem List   Diagnosis Date Noted   Osteoarthritis of right knee 03/27/2021   Mixed Alzheimer's and vascular dementia (HCC) 05/18/2020   Scrotal pain 09/01/2019   History of prostate cancer 09/01/2019   History of colonic polyps    Problems with swallowing and mastication    Abnormal barium swallow    Stricture and stenosis of esophagus    Hydrocele in adult 04/19/2019   Hearing difficulty 05/12/2017   Dementia (HCC) 10/16/2016   Inguinal pain 05/12/2016   Erectile dysfunction  after prostate brachytherapy 02/12/2016   Solitary kidney 02/12/2016   Prediabetes 02/08/2016   GERD (gastroesophageal reflux disease) 02/06/2016   Headache 02/06/2016   Hypogonadism male 02/06/2016   Insomnia 02/06/2016   Numbness of toes 02/06/2016   Palpitations 02/06/2016   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 Triphasic waver forms   BALLOON DILATION N/A 06/13/2019   Procedure: BALLOON DILATION;  Surgeon: Jinny Carmine, MD;  Location: [facility];  Service: Endoscopy;  Laterality: N/A;   Carotid Doppler Ultrasound  10/18/2012   Minimal soft plague both carotids. No hemodynamically significant stenosis   CHOLECYSTECTOMY  2001   COLONOSCOPY WITH PROPOFOL  N/A 06/13/2019   Procedure: COLONOSCOPY WITH BIOPSIES;  Surgeon: Jinny Carmine, MD;  Location: [facility];  Service: Endoscopy;  Laterality: N/A;   ESOPHAGOGASTRODUODENOSCOPY (EGD) WITH PROPOFOL  N/A 06/13/2019   Procedure: ESOPHAGOGASTRODUODENOSCOPY (EGD) WITH BALLOON DILATION;  Surgeon: Jinny Carmine, MD;  Location: [facility];  Service: Endoscopy;  Laterality: N/A;   Lumbar Spine X Ray  11/28/2011   Mild DDD L1- L2. Anterior heigt loss of T12-L1 vertebral  bodies   NEPHRECTOMY Right 2001   done by Dr. Gala for Angiomyolipoma   PENILE PROSTHESIS  REMOVAL  11/28/2013   Dr. Gala   PENILE PROSTHESIS IMPLANT  2008   Photovaporization of prostate with green light laser  01/19/2012   Dr. Waddell   POLYPECTOMY N/A 06/13/2019   Procedure: POLYPECTOMY;  Surgeon: Jinny Carmine, MD;  Location: [facility];  Service: Endoscopy;  Laterality:  N/A;   SPINE SURGERY  1991   UPPER GI ENDOSCOPY  07/10/2011   Done by Dr. Evy. Revealed Schatski rings       Home Medications    Prior to Admission medications   Medication Sig Start Date End Date Taking? Authorizing Provider  amLODipine  (NORVASC ) 5 MG tablet TAKE 1 TABLET(5 MG) BY MOUTH DAILY 06/22/24  Yes Gasper Nancyann BRAVO, MD  hydrochlorothiazide  (HYDRODIURIL ) 12.5 MG tablet TAKE 1 TABLET(12.5 MG) BY MOUTH DAILY 03/26/24  Yes Gasper Nancyann BRAVO, MD  Lactulose 20 GM/30ML SOLN Take 22.5 mLs (15 g total) by mouth 2 (two) times daily for 5 days. 10/02/24 10/07/24 Yes Aleka Twitty E, PA-C  QUEtiapine  (SEROQUEL ) 25 MG tablet Take 1 tablet (25 mg total) by mouth at bedtime as needed. 04/22/24  Yes Gasper Nancyann BRAVO, MD  sertraline  (ZOLOFT ) 100 MG tablet TAKE 1 TABLET BY MOUTH EVERY DAY 10/06/23  Yes Gasper Nancyann BRAVO, MD    Family History Family History  Problem Relation Age of Onset   Heart attack Mother    Hypertension Father    Prostate cancer Neg Hx    Bladder Cancer Neg Hx    Kidney cancer Neg Hx     Social History Social History   Tobacco Use   Smoking status: Never   Smokeless tobacco: Never   Tobacco comments:    smokes a Education officer, community with friends  Vaping Use   Vaping status: Never Used  Substance Use Topics   Alcohol use: Not Currently    Comment: occasional / monthly   Drug use: No     Allergies   Donepezil    Review of Systems Review of Systems  Constitutional:  Negative for appetite change, chills, diaphoresis, fever and unexpected weight change.  HENT:  Negative for congestion, ear pain, sinus pressure, sinus pain, sneezing, sore throat and trouble swallowing.   Respiratory:  Negative for cough, chest tightness and shortness of breath.   Cardiovascular:  Negative for chest pain.  Gastrointestinal:  Positive for abdominal pain. Negative for abdominal distention, anal bleeding, blood in stool, constipation, diarrhea, nausea, rectal pain and  vomiting.  Genitourinary:  Negative for dysuria, flank pain, frequency and urgency.  Musculoskeletal:  Negative for back pain and myalgias.  Neurological:  Negative for dizziness, light-headedness and headaches.     Physical Exam Triage Vital Signs ED Triage Vitals  Encounter Vitals Group     BP 10/02/24 1405 126/62     Girls Systolic BP Percentile --      Girls Diastolic BP Percentile --      Boys Systolic BP Percentile --      Boys Diastolic BP Percentile --      Pulse Rate 10/02/24 1405 64     Resp 10/02/24 1405 18     Temp 10/02/24 1421 97.6 F (36.4 C)     Temp Source 10/02/24 1405 Oral     SpO2 10/02/24 1405 100 %     Weight 10/02/24 1403 165 lb (74.8 kg)     Height --      Head Circumference --  Peak Flow --      Pain Score --      Pain Loc --      Pain Education --      Exclude from Growth Chart --    No data found.  Updated Vital Signs BP 126/62 (BP Location: Left Arm)   Pulse 64   Temp 97.6 F (36.4 C) (Temporal)   Resp 18   Wt 165 lb (74.8 kg)   SpO2 100%   BMI 22.38 kg/m   Visual Acuity Right Eye Distance:   Left Eye Distance:   Bilateral Distance:    Right Eye Near:   Left Eye Near:    Bilateral Near:     Physical Exam Vitals reviewed.  Constitutional:      General: He is not in acute distress.    Appearance: Normal appearance. He is not ill-appearing.  HENT:     Head: Normocephalic and atraumatic.     Mouth/Throat:     Mouth: Mucous membranes are moist.     Comments: Moist mucous membranes Eyes:     Extraocular Movements: Extraocular movements intact.     Pupils: Pupils are equal, round, and reactive to light.  Cardiovascular:     Rate and Rhythm: Normal rate and regular rhythm.     Heart sounds: Normal heart sounds.  Pulmonary:     Effort: Pulmonary effort is normal.     Breath sounds: Normal breath sounds. No wheezing, rhonchi or rales.  Abdominal:     General: Bowel sounds are decreased. There is no distension.      Palpations: Abdomen is soft. There is no mass.     Tenderness: There is no abdominal tenderness. There is no right CVA tenderness, left CVA tenderness, guarding or rebound. Negative signs include Murphy's sign, Rovsing's sign and McBurney's sign.     Comments: Bowel sounds decreased.  There is no point tenderness.  There is no guarding or rebound.  Skin:    General: Skin is warm.     Capillary Refill: Capillary refill takes less than 2 seconds.     Comments: Good skin turgor  Neurological:     General: No focal deficit present.     Mental Status: He is alert and oriented to person, place, and time.  Psychiatric:        Mood and Affect: Mood normal.        Behavior: Behavior normal.      UC Treatments / Results  Labs (all labs ordered are listed, but only abnormal results are displayed) Labs Reviewed - No data to display  EKG   Radiology No results found.  Procedures Procedures (including critical care time)  Medications Ordered in UC Medications - No data to display  Initial Impression / Assessment and Plan / UC Course  I have reviewed the triage vital signs and the nursing notes.  Pertinent labs & imaging results that were available during my care of the patient were reviewed by me and considered in my medical decision making (see chart for details).     This patient is a 78 year old male presenting with constipation.  His last bowel movement was 09/30/2024, and he is still passing gas.  Will manage with MiraLAX 4 capfuls twice daily, 32 ounces of water  twice daily, lactulose twice daily.  If this does not elicit a bowel movement, or if symptoms worsen, including abdominal pain, unable to pass bowel movement, unable to pass gas-head to the emergency department.  Final Clinical Impressions(s) / UC  Diagnoses   Final diagnoses:  Slow transit constipation     Discharge Instructions      -Lactulose twice daily for up to 5 days, and until bowel movement is  elicited. -Miralax 4 capfuls in morning and evening, until bowel movement is elicited. Take this medication with 32oz of water .  -Eat a diet high in fiber and leafy greens.      ED Prescriptions     Medication Sig Dispense Auth. Provider   Lactulose 20 GM/30ML SOLN Take 22.5 mLs (15 g total) by mouth 2 (two) times daily for 5 days. 225 mL Elaisha Zahniser E, PA-C      PDMP not reviewed this encounter.   Arlyss Leita BRAVO, PA-C 10/02/24 [phone]

## 2024-10-03 NOTE — Telephone Encounter (Signed)
 Patient's wife aware message was sent via mychart and that they can reply back once they have information that we need.

## 2024-10-12 NOTE — Telephone Encounter (Signed)
 Spoke with Nathanel FURNACE form ready. Given to [employer] .

## 2024-10-12 NOTE — Telephone Encounter (Signed)
 Forms completed and left at my workstation

## 2024-10-21 ENCOUNTER — Ambulatory Visit: Admitting: Family Medicine

## 2024-10-24 ENCOUNTER — Ambulatory Visit (INDEPENDENT_AMBULATORY_CARE_PROVIDER_SITE_OTHER): Admitting: Family Medicine

## 2024-10-24 ENCOUNTER — Encounter: Payer: Self-pay | Admitting: Family Medicine

## 2024-10-24 VITALS — BP 122/80 | HR 93 | Resp 16

## 2024-10-24 DIAGNOSIS — R7303 Prediabetes: Secondary | ICD-10-CM

## 2024-10-24 DIAGNOSIS — E559 Vitamin D deficiency, unspecified: Secondary | ICD-10-CM | POA: Diagnosis not present

## 2024-10-24 DIAGNOSIS — G309 Alzheimer's disease, unspecified: Secondary | ICD-10-CM

## 2024-10-24 DIAGNOSIS — F0280 Dementia in other diseases classified elsewhere without behavioral disturbance: Secondary | ICD-10-CM

## 2024-10-24 DIAGNOSIS — I10 Essential (primary) hypertension: Secondary | ICD-10-CM

## 2024-10-24 DIAGNOSIS — F0150 Vascular dementia without behavioral disturbance: Secondary | ICD-10-CM

## 2024-10-24 NOTE — Patient Instructions (Signed)
 SABRA  Please review the attached list of medications and notify my office if there are any errors.   . Please bring all of your medications to every appointment so we can make sure that our medication list is the same as yours.

## 2024-11-09 NOTE — Progress Notes (Signed)
 Established patient visit   Patient: Luke [name]   DOB: March 17, 1946   78 y.o. Male  MRN: [id number] Visit Date: 10/24/2024  Today's healthcare provider: Nancyann Perry, MD   Chief Complaint  Patient presents with   Medical Management of Chronic Issues    HTN, Dementia follow-up   Subjective    Discussed the use of AI scribe software for clinical note transcription with the patient, who gave verbal consent to proceed.  History of Present Illness   Luke [name] is a 78 year old male with Alzheimer's dementia, hypertension, and vitamin D  deficiency who presents for a follow-up visit. He is accompanied by his caregiver.  He continues to take Seroquel  in the evening, which aids in his sleep, and his caregiver administers melatonin, either 5 mg or 10 mg, to further assist with sleep. He occasionally wakes at night to use the bathroom.  He was recently diagnosed with stage one cancer by his urologist, Dr. Keturah, and is following up with blood work.  He has a history of vitamin D  deficiency and has recently started taking a liquid vitamin D  supplement. His caregiver administers three drops in juice daily, but the exact dosage in units is unknown.  He experiences episodes of severe constipation, with a notable episode about a month ago when he was constipated for six days before seeking care. He uses Miralax once a week, but his caregiver reports irregular bowel movements. He was given Miralax today to address this issue.  He has a sebaceous cyst on his jawline, which has not increased in size over the past year and has not been draining.     Lab Results  Component Value Date   VD25OH 22.3 (L) 04/22/2024   Lab Results  Component Value Date   NA 143 04/22/2024   K 3.7 04/22/2024   CREATININE 1.06 04/22/2024   EGFR 72 04/22/2024   GLUCOSE 77 04/22/2024   Lab Results  Component Value Date   HGBA1C 5.5 04/22/2024   HGBA1C 6.0 (H) 10/13/2023   HGBA1C 5.6 03/21/2022    Lab Results  Component Value Date   CHOL 173 10/13/2023   HDL 50 10/13/2023   LDLCALC 99 10/13/2023   TRIG 137 10/13/2023   CHOLHDL 3.5 10/13/2023     Medications: Outpatient Medications Prior to Visit  Medication Sig   amLODipine  (NORVASC ) 5 MG tablet TAKE 1 TABLET(5 MG) BY MOUTH DAILY   hydrochlorothiazide  (HYDRODIURIL ) 12.5 MG tablet TAKE 1 TABLET(12.5 MG) BY MOUTH DAILY   QUEtiapine  (SEROQUEL ) 25 MG tablet Take 1 tablet (25 mg total) by mouth at bedtime as needed.   sertraline  (ZOLOFT ) 100 MG tablet TAKE 1 TABLET BY MOUTH EVERY DAY   No facility-administered medications prior to visit.   Review of Systems  Constitutional:  Negative for appetite change, chills and fever.  Respiratory:  Negative for chest tightness, shortness of breath and wheezing.   Cardiovascular:  Negative for chest pain and palpitations.  Gastrointestinal:  Negative for abdominal pain, nausea and vomiting.       Objective    BP 122/80 (BP Location: Left Arm, Patient Position: Sitting, Cuff Size: Small)   Pulse 93   Resp 16   Physical Exam   General: Appearance:    Well developed, well nourished male in no acute distress  Eyes:    PERRL, conjunctiva/corneas clear, EOM's intact       Lungs:     Clear to auscultation bilaterally, respirations unlabored  Heart:  Normal heart rate. Normal rhythm. No murmurs, rubs, or gallops.    MS:   All extremities are intact.    Neurologic:   Not conversant. Not oriented to place or time. No apparent focal neurological defect.        Assessment & Plan     1. Mixed Alzheimer's and vascular dementia (HCC) (Primary) Stable, no improvement on previous trials of dementia medications. No longer seeing neurology.   2. Primary hypertension Well controlled.  Continue current medications.    3. Vitamin D  deficiency Recently started on liquid vitamin D  supplements.   4. Prediabetes Lab Results  Component Value Date   HGBA1C 5.5 04/22/2024   Future  Appointments  Date Time Provider Department Center  02/16/2025  2:30 PM [facility] None  02/22/2025  2:30 PM Francisca Redell BROCKS, MD [facility] None  02/24/2025  2:40 PM Gasper Nancyann BRAVO, MD [facility] [place]  03/28/2025 11:30 AM [facility]-ANNUAL WELLNESS VISIT [facility] Michaela Nancyann Gasper, MD  [facility] [phone] (phone) [phone] (fax)  [facility]

## 2024-12-17 ENCOUNTER — Other Ambulatory Visit: Payer: Self-pay | Admitting: Family Medicine

## 2024-12-17 DIAGNOSIS — F410 Panic disorder [episodic paroxysmal anxiety] without agoraphobia: Secondary | ICD-10-CM

## 2025-01-31 ENCOUNTER — Emergency Department
Admission: EM | Admit: 2025-01-31 | Discharge: 2025-01-31 | Disposition: A | Discharge status: Home / Self Care | Attending: Emergency Medicine | Admitting: Emergency Medicine

## 2025-01-31 ENCOUNTER — Other Ambulatory Visit: Payer: Self-pay

## 2025-01-31 DIAGNOSIS — R55 Syncope and collapse: Secondary | ICD-10-CM | POA: Insufficient documentation

## 2025-01-31 DIAGNOSIS — I10 Essential (primary) hypertension: Secondary | ICD-10-CM | POA: Insufficient documentation

## 2025-01-31 DIAGNOSIS — F0390 Unspecified dementia without behavioral disturbance: Secondary | ICD-10-CM | POA: Insufficient documentation

## 2025-01-31 LAB — URINALYSIS, ROUTINE W REFLEX MICROSCOPIC
Bilirubin Urine: NEGATIVE
Glucose, UA: NEGATIVE mg/dL
Hgb urine dipstick: NEGATIVE
Ketones, ur: NEGATIVE mg/dL
Leukocytes,Ua: NEGATIVE
Nitrite: NEGATIVE
Protein, ur: NEGATIVE mg/dL
Specific Gravity, Urine: 1.024 (ref 1.005–1.030)
pH: 5 (ref 5.0–8.0)

## 2025-01-31 LAB — COMPREHENSIVE METABOLIC PANEL WITH GFR
ALT: 17 U/L (ref 0–44)
AST: 18 U/L (ref 15–41)
Albumin: 4 g/dL (ref 3.5–5.0)
Alkaline Phosphatase: 75 U/L (ref 38–126)
Anion gap: 9 (ref 5–15)
BUN: 17 mg/dL (ref 8–23)
CO2: 29 mmol/L (ref 22–32)
Calcium: 9.3 mg/dL (ref 8.9–10.3)
Chloride: 106 mmol/L (ref 98–111)
Creatinine, Ser: 1.1 mg/dL (ref 0.61–1.24)
GFR, Estimated: >60 mL/min
Glucose, Bld: 91 mg/dL (ref 70–99)
Potassium: 3.9 mmol/L (ref 3.5–5.1)
Sodium: 143 mmol/L (ref 135–145)
Total Bilirubin: 0.7 mg/dL (ref 0.0–1.2)
Total Protein: 6.5 g/dL (ref 6.5–8.1)

## 2025-01-31 LAB — CBC
HCT: 38.2 % — ABNORMAL LOW (ref 39.0–52.0)
Hemoglobin: 11.9 g/dL — ABNORMAL LOW (ref 13.0–17.0)
MCH: 21.5 pg — ABNORMAL LOW (ref 26.0–34.0)
MCHC: 31.2 g/dL (ref 30.0–36.0)
MCV: 69.1 fL — ABNORMAL LOW (ref 80.0–100.0)
Platelets: 216 10*3/uL (ref 150–400)
RBC: 5.53 MIL/uL (ref 4.22–5.81)
RDW: 17.1 % — ABNORMAL HIGH (ref 11.5–15.5)
WBC: 4.3 10*3/uL (ref 4.0–10.5)
nRBC: 0 % (ref 0.0–0.2)

## 2025-01-31 NOTE — ED Notes (Signed)
 Bladder scan 2x: 90mL. No urine in suction container from pur wick.

## 2025-01-31 NOTE — ED Provider Notes (Signed)
----------------------------------------- °  5:03 PM on 01/31/2025 ----------------------------------------- Patient care assumed from Dr. Viviann.  Patient's urinalysis has resulted normal.  CBC and chemistry are reassuring.  Given the patient's reassuring medical workup I believe the patient will be safe for discharge home with outpatient follow-up.   Dorothyann Drivers, MD 01/31/25 1704

## 2025-01-31 NOTE — ED Notes (Signed)
 Patient pulled out IV.

## 2025-01-31 NOTE — ED Provider Notes (Signed)
 "  [facility] Provider Note    Event Date/Time   First MD Initiated Contact with Patient 01/31/25 1346     (approximate)   History   Chief Complaint: Hypotension   HPI  Luke [name] is a 79 y.o. male with a history of [place], hypertension who was brought to the ED due to passing out at home.  Was in his usual state of health, eating and drinking normally, had a good night of sleep last night, and this morning after family woke him up and took him to the bathroom, on leaving the bathroom, he appeared to pass out briefly.  They were standing by and supporting and gently lowered him to the ground without injury.  After few moments he woke back up.  They have not noticed any acute complaints or other new health concerns.        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   Dysphagia 04/02/2009   GERD (gastroesophageal reflux disease) 02/06/2016   Schatski ring seen on EGD 07-10-11    History of MRSA infection 11/18/2007   Hyperlipidemia, mixed 12/29/1998   Hypertension 04/02/2009   Hypogonadism male 02/06/2016   Insomnia 02/06/2016   LBP (low back pain) 07/13/2013   Numbness of toes 02/06/2016   Palpitations 02/06/2016   Prediabetes 02/08/2016   A1c=6.2 02/08/16    Prostate cancer (HCC) 2014   Seed Implants   Single kidney    Vitamin D  deficiency 02/06/2016    Current Outpatient Rx   Order #: [id number] Class: Normal   Order #: [id number] Class: Normal   Order #: [id number] Class: Normal   Order #: [id number] Class: Normal    Past Surgical History:  Procedure Laterality Date   ABI  07/09/2011   Normal; Left= 1.21, Right=1.00. Triphasic waver forms   BALLOON DILATION N/A 06/13/2019   Procedure: BALLOON DILATION;  Surgeon: Jinny Carmine, MD;  Location: [facility];  Service: Endoscopy;  Laterality: N/A;   Carotid Doppler  Ultrasound  10/18/2012   Minimal soft plague both carotids. No hemodynamically significant stenosis   CHOLECYSTECTOMY  2001   COLONOSCOPY WITH PROPOFOL  N/A 06/13/2019   Procedure: COLONOSCOPY WITH BIOPSIES;  Surgeon: Jinny Carmine, MD;  Location: [facility];  Service: Endoscopy;  Laterality: N/A;   ESOPHAGOGASTRODUODENOSCOPY (EGD) WITH PROPOFOL  N/A 06/13/2019   Procedure: ESOPHAGOGASTRODUODENOSCOPY (EGD) WITH BALLOON DILATION;  Surgeon: Jinny Carmine, MD;  Location: [facility];  Service: Endoscopy;  Laterality: N/A;   Lumbar Spine X Ray  11/28/2011   Mild DDD L1- L2. Anterior heigt loss of T12-L1 vertebral bodies   NEPHRECTOMY Right 2001   done by Dr. Gala for Angiomyolipoma   PENILE PROSTHESIS  REMOVAL  11/28/2013   Dr. Gala   PENILE PROSTHESIS IMPLANT  2008   Photovaporization of prostate with green light laser  01/19/2012   Dr. Waddell   POLYPECTOMY N/A 06/13/2019   Procedure: POLYPECTOMY;  Surgeon: Jinny Carmine, MD;  Location: [facility];  Service: Endoscopy;  Laterality: N/A;   SPINE SURGERY  1991   UPPER GI ENDOSCOPY  07/10/2011   Done by Dr. Evy. Revealed Schatski rings    Physical Exam   Triage Vital Signs: ED Triage Vitals  Encounter Vitals Group     BP 01/31/25 1052 (!) 147/71     Girls Systolic BP Percentile --      Girls Diastolic BP  Percentile --      Boys Systolic BP Percentile --      Boys Diastolic BP Percentile --      Pulse Rate 01/31/25 1052 75     Resp 01/31/25 1052 18     Temp 01/31/25 1052 98 F (36.7 C)     Temp Source 01/31/25 1430 Oral     SpO2 01/31/25 1052 94 %     Weight --      Height --      Head Circumference --      Peak Flow --      Pain Score 01/31/25 1045 0     Pain Loc --      Pain Education --      Exclude from Growth Chart --     Most recent vital signs: Vitals:   01/31/25 1052 01/31/25 1430  BP: (!) 147/71 (!) 148/76  Pulse: 75 61  Resp: 18 18  Temp: 98 F (36.7 C) 98.2 F (36.8 C)  SpO2: 94% 100%     General: Awake, no distress.  CV:  Good peripheral perfusion.  Regular rate rhythm Resp:  Normal effort.  Clear lungs Abd:  No distention.  Soft nontender Other:  Moist oral mucosa.  No signs of trauma   ED Results / Procedures / Treatments   Labs (all labs ordered are listed, but only abnormal results are displayed) Labs Reviewed  CBC - Abnormal; Notable for the following components:      Result Value   Hemoglobin 11.9 (*)    HCT 38.2 (*)    MCV 69.1 (*)    MCH 21.5 (*)    RDW 17.1 (*)    All other components within normal limits  COMPREHENSIVE METABOLIC PANEL WITH GFR  URINALYSIS, ROUTINE W REFLEX MICROSCOPIC  CBG MONITORING, ED     EKG Interpreted by me Sinus pericardia rate of 52.  Normal axis intervals QRS ST segments T waves   RADIOLOGY    PROCEDURES:  Procedures   MEDICATIONS ORDERED IN ED: Medications - No data to display   IMPRESSION / MDM / ASSESSMENT AND PLAN / ED COURSE  I reviewed the triage vital signs and the nursing notes.  DDx: Dehydration, AKI, anemia, UTI, urinary retention, orthostatic syncope  Patient's presentation is most consistent with acute presentation with potential threat to life or bodily function.  Patient brought to the ED due to episode of syncope.  He has advanced dementia, not able to answer questions or provide any history.  Exam is benign, nonfocal, vital signs unremarkable.  He is calm, nontoxic.  Serum labs unremarkable.  He is incontinent of urine.  Will obtain urinalysis, PVR bladder scan.  Anticipate discharge home later, with or without antibiotics, with or without urinary catheter is indicated.       FINAL CLINICAL IMPRESSION(S) / ED DIAGNOSES   Final diagnoses:  Syncope, unspecified syncope type  Chronic dementia (HCC)     Rx / DC Orders   ED Discharge Orders     None        Note:  This document was prepared using Dragon voice recognition software and may include unintentional dictation  errors.   Viviann Pastor, MD 01/31/25 1456  "

## 2025-01-31 NOTE — ED Triage Notes (Signed)
 Pt comes via EMS from home with c/o hypotension and weakness. Pt has dementia.   BP 100/50, pt given 400 fluids and 160/57 HR 60 CBG 97 97.6 ax   Pt denies any pain. Pt is nonverbal,. Pt lives at home with family.

## 2025-02-16 ENCOUNTER — Other Ambulatory Visit

## 2025-02-22 ENCOUNTER — Ambulatory Visit: Admitting: Urology

## 2025-02-24 ENCOUNTER — Ambulatory Visit: Admitting: Family Medicine

## 2025-03-01 ENCOUNTER — Ambulatory Visit: Admitting: Family Medicine

## 2025-03-28 ENCOUNTER — Ambulatory Visit
